# Patient Record
Sex: Female | Born: 1945 | Race: White | Hispanic: No | Marital: Married | Smoking: Former smoker
Health system: Southern US, Community
[De-identification: ages and names within clinical notes are randomized; demographics above are authoritative.]

## PROBLEM LIST (undated history)

## (undated) DIAGNOSIS — E871 Hypo-osmolality and hyponatremia: Secondary | ICD-10-CM

## (undated) DIAGNOSIS — N183 Chronic kidney disease, stage 3 unspecified: Secondary | ICD-10-CM

## (undated) DIAGNOSIS — D696 Thrombocytopenia, unspecified: Secondary | ICD-10-CM

## (undated) DIAGNOSIS — I10 Essential (primary) hypertension: Secondary | ICD-10-CM

## (undated) DIAGNOSIS — N179 Acute kidney failure, unspecified: Secondary | ICD-10-CM

## (undated) DIAGNOSIS — C801 Malignant (primary) neoplasm, unspecified: Secondary | ICD-10-CM

## (undated) DIAGNOSIS — I639 Cerebral infarction, unspecified: Secondary | ICD-10-CM

## (undated) DIAGNOSIS — C3490 Malignant neoplasm of unspecified part of unspecified bronchus or lung: Secondary | ICD-10-CM

## (undated) DIAGNOSIS — H547 Unspecified visual loss: Secondary | ICD-10-CM

## (undated) DIAGNOSIS — D649 Anemia, unspecified: Secondary | ICD-10-CM

## (undated) DIAGNOSIS — I4891 Unspecified atrial fibrillation: Secondary | ICD-10-CM

## (undated) DIAGNOSIS — E785 Hyperlipidemia, unspecified: Secondary | ICD-10-CM

## (undated) HISTORY — DX: Essential (primary) hypertension: I10

## (undated) HISTORY — DX: Hyperlipidemia, unspecified: E78.5

## (undated) HISTORY — PX: TUBAL LIGATION: SHX77

---

## 2014-03-18 ENCOUNTER — Ambulatory Visit (INDEPENDENT_AMBULATORY_CARE_PROVIDER_SITE_OTHER): Payer: 59

## 2014-03-18 VITALS — BP 122/79 | HR 84 | Resp 12

## 2014-03-18 DIAGNOSIS — R52 Pain, unspecified: Secondary | ICD-10-CM

## 2014-03-18 DIAGNOSIS — M722 Plantar fascial fibromatosis: Secondary | ICD-10-CM

## 2014-03-18 DIAGNOSIS — M7732 Calcaneal spur, left foot: Secondary | ICD-10-CM

## 2014-03-18 MED ORDER — MELOXICAM 15 MG PO TABS: 15.0000 mg | ORAL_TABLET | Freq: Every day | ORAL | Status: DC

## 2014-03-18 NOTE — Progress Notes (Signed)
   Subjective:    Patient ID: Michele [name], female    DOB: 1945-09-30, 68 y.o.   MRN: [id number]  HPI  PT STATED LT FOOT BOTTOM OF THE HEEL IS BEEN SORE FOR 4 MONTHS. THE HEEL IS NOT GETTING ANY BETTER OR WORSE. THE FOOT GET AGGRAVATED BY PRESSURE. TRIED WEARING GEL INSERTS BUT NO HELP.  Review of Systems  HENT: Positive for hearing loss.        RINGING EARS  All other systems reviewed and are negative.      Objective:   Physical Exam 68 year old white female well-developed well-nourished oriented 3 presents this time with a several month history of inferior left heel and arch pain both feet have some nodular areas in the medial band of plantar fascia which slight tenderness most significant pain in the inferior calcaneal tubercle area of the left heel. X-rays confirm well-developed inferior calcaneal spur generalized mild osteopenic changes mild fascial thickening noted with some nodularity or fibrosis noted in fashion bilateral. Neurovascular status otherwise intact pedal pulses are palpable epicritic and proprioceptive sensations intact and symmetric bilateral there is normal plantar response and DTRs. Dermatologic his skin color pigment normal hair growth absent nails are incurvated       Assessment & Plan:  Assessment plantar fasciitis/heel spur syndrome left at this time fascial strapping applied to the left foot prescription for meloxicam is given recommend ice to the affected area stable shoes no barefoot or flimsy shoes or flip-flops reappoint 2 weeks for follow-up may be candidate for orthoses based on progress next  Harriet Masson DPM

## 2014-03-18 NOTE — Patient Instructions (Signed)
Plantar Fasciitis Plantar fasciitis is a common condition that causes foot pain. It is soreness (inflammation) of the band of tough fibrous tissue on the bottom of the foot that runs from the heel bone (calcaneus) to the ball of the foot. The cause of this soreness may be from excessive standing, poor fitting shoes, running on hard surfaces, being overweight, having an abnormal walk, or overuse (this is common in runners) of the painful foot or feet. It is also common in aerobic exercise dancers and ballet dancers. SYMPTOMS  Most people with plantar fasciitis complain of:  Severe pain in the morning on the bottom of their foot especially when taking the first steps out of bed. This pain recedes after a few minutes of walking.  Severe pain is experienced also during walking following a long period of inactivity.  Pain is worse when walking barefoot or up stairs DIAGNOSIS   Your caregiver will diagnose this condition by examining and feeling your foot.  Special tests such as X-rays of your foot, are usually not needed. PREVENTION   Consult a sports medicine professional before beginning a new exercise program.  Walking programs offer a good workout. With walking there is a lower chance of overuse injuries common to runners. There is less impact and less jarring of the joints.  Begin all new exercise programs slowly. If problems or pain develop, decrease the amount of time or distance until you are at a comfortable level.  Wear good shoes and replace them regularly.  Stretch your foot and the heel cords at the back of the ankle (Achilles tendon) both before and after exercise.  Run or exercise on even surfaces that are not hard. For example, asphalt is better than pavement.  Do not run barefoot on hard surfaces.  If using a treadmill, vary the incline.  Do not continue to workout if you have foot or joint problems. Seek professional help if they do not improve. HOME CARE INSTRUCTIONS     Avoid activities that cause you pain until you recover.  Use ice or cold packs on the problem or painful areas after working out.  Only take over-the-counter or prescription medicines for pain, discomfort, or fever as directed by your caregiver.  Soft shoe inserts or athletic shoes with air or gel sole cushions may be helpful.  If problems continue or become more severe, consult a sports medicine caregiver or your own health care provider. Cortisone is a potent anti-inflammatory medication that may be injected into the painful area. You can discuss this treatment with your caregiver. MAKE SURE YOU:   Understand these instructions.  Will watch your condition.  Will get help right away if you are not doing well or get worse. Document Released: 01/29/2001 Document Revised: 07/29/2011 Document Reviewed: 03/30/2008 [facility] Patient Information 2015 [place], [place]. This information is not intended to replace advice given to you by your health care provider. Make sure you discuss any questions you have with your health care provider.    ICE INSTRUCTIONS  Apply ice or cold pack to the affected area at least 3 times a day for 10-15 minutes each time.  You should also use ice after prolonged activity or vigorous exercise.  Do not apply ice longer than 20 minutes at one time.  Always keep a cloth between your skin and the ice pack to prevent burns.  Being consistent and following these instructions will help control your symptoms.  We suggest you purchase a gel ice pack because they are  reusable and do bit leak.  Some of them are designed to wrap around the area.  Use the method that works best for you.  Here are some other suggestions for icing.   Use a frozen bag of peas or corn-inexpensive and molds well to your body, usually stays frozen for 10 to 20 minutes.  Wet a towel with cold water and squeeze out the excess until it's damp.  Place in a bag in the freezer for 20 minutes. Then remove  and use.

## 2014-04-01 ENCOUNTER — Ambulatory Visit (INDEPENDENT_AMBULATORY_CARE_PROVIDER_SITE_OTHER): Payer: 59

## 2014-04-01 VITALS — BP 131/72 | HR 64 | Resp 12

## 2014-04-01 DIAGNOSIS — M7732 Calcaneal spur, left foot: Secondary | ICD-10-CM

## 2014-04-01 DIAGNOSIS — M722 Plantar fascial fibromatosis: Secondary | ICD-10-CM

## 2014-04-01 DIAGNOSIS — R52 Pain, unspecified: Secondary | ICD-10-CM

## 2014-04-01 NOTE — Progress Notes (Signed)
   Subjective:    Patient ID: Michele [name], female    DOB: 21-Mar-1946, 68 y.o.   MRN: [id number]  HPI  ''LT FOOT START HAVING PAIN STARTED YESTERDAY BUT THE STRAP HELPS A LOT.''  Review of Systemsno new findings or systemic changes noted     Objective:   Physical Exam Neurovascular status is intact pedal pulses are palpable while fascial strapping was in place had a significant improvement based on that fact is a strong candidate for functional orthoses at this time orthotics are excluded from her coverage and alternative would be a prefab OTC orthotics such as power step. Our step orthotics I 6 never dispensed fitted to the patient's foot oral and written instructions for use of the orthotic and break in are given recheck in a month or 2 for adjustments if needed       Assessment & Plan:  Assessment plantar fasciitis/heel spur syndrome responsive to fascial taping should respond to OTC power step orthotics will reassess in a month or 2 if needed. Written instructions for orthotics are given recommend continue NSAID and ice as needed  Harriet Masson DPM

## 2014-04-01 NOTE — Patient Instructions (Signed)

## 2018-01-04 NOTE — Progress Notes (Signed)
Synopsis: Referred in August 2019 for LLL Lung mass w/ Adenopathy by Ronita Hipps, MD  Subjective:   PATIENT ID: Michele [name] GENDER: female DOB: 1945/09/13, MRN: [id number]  Chief Complaint  Patient presents with  . Pulmonary Consult    referred by Eugenio Hoes, NP for Lung Mass    Patient has a past medical history of former tobacco abuse, hypertension, hyperlipidemia.  Patient was recently seen in the emergency room for evaluation of chest pain and epigastric pain.  She had chest imaging which revealed the presence of the left lower lobe mass as well as enlarged mediastinal adenopathy and hilar adenopathy.  The patient was subsequently referred to [facility] cancer center and was seen by Ms. Theurkorn, NP who works with Dr. Lavera Guise.  This abnormality was found incidentally.  She has no additional complaints.  She is a former smoker who quit in 2010 however is currently vaping.  She does have occasional cough.  She denies hemoptysis.   Past Medical History:  Diagnosis Date  . Hyperlipidemia   . Hypertension      Family History  Problem Relation Age of Onset  . Dementia Mother   . Breast cancer Mother   . Heart attack Father      Social History   Socioeconomic History  . Marital status: Married    Spouse name: Not on file  . Number of children: Not on file  . Years of education: Not on file  . Highest education level: Not on file  Occupational History  . Not on file  Social Needs  . Financial resource strain: Not on file  . Food insecurity:    Worry: Not on file    Inability: Not on file  . Transportation needs:    Medical: Not on file    Non-medical: Not on file  Tobacco Use  . Smoking status: Former Smoker    Packs/day: 1.00    Years: 25.00    Pack years: 25.00    Types: Cigarettes    Last attempt to quit: 05/20/2008    Years since quitting: 9.6  . Smokeless tobacco: Never Used  Substance and Sexual Activity  . Alcohol use: No  . Drug use: No    . Sexual activity: Not on file  Lifestyle  . Physical activity:    Days per week: Not on file    Minutes per session: Not on file  . Stress: Not on file  Relationships  . Social connections:    Talks on phone: Not on file    Gets together: Not on file    Attends religious service: Not on file    Active member of club or organization: Not on file    Attends meetings of clubs or organizations: Not on file    Relationship status: Not on file  . Intimate partner violence:    Fear of current or ex partner: Not on file    Emotionally abused: Not on file    Physically abused: Not on file    Forced sexual activity: Not on file  Other Topics Concern  . Not on file  Social History Narrative  . Not on file     No Known Allergies   Outpatient Medications Prior to Visit  Medication Sig Dispense Refill  . atorvastatin (LIPITOR) 20 MG tablet Take 20 mg by mouth daily.  3  . losartan (COZAAR) 25 MG tablet Take 25 mg by mouth daily.  3  . meloxicam (MOBIC) 15 MG tablet  Take 1 tablet (15 mg total) by mouth daily. 30 tablet 1   No facility-administered medications prior to visit.     Review of Systems  Constitutional: Negative.   HENT: Negative.   Eyes: Negative.   Respiratory: Positive for cough, sputum production and shortness of breath.   Cardiovascular: Negative.   Gastrointestinal: Negative.   Musculoskeletal: Negative.   Skin: Negative.   Neurological: Negative.   Endo/Heme/Allergies: Negative.   Psychiatric/Behavioral: Negative.      Objective:  Physical Exam  Constitutional: She is oriented to person, place, and time. She appears well-developed and well-nourished. No distress.  HENT:  Head: Normocephalic and atraumatic.  Mouth/Throat: Oropharynx is clear and moist.  Eyes: Pupils are equal, round, and reactive to light. Conjunctivae are normal. No scleral icterus.  Neck: Neck supple. No JVD present. No tracheal deviation present.  Cardiovascular: Normal rate, regular  rhythm, normal heart sounds and intact distal pulses.  No murmur heard. Pulmonary/Chest: Effort normal and breath sounds normal. No accessory muscle usage or stridor. No tachypnea. No respiratory distress. She has no wheezes. She has no rhonchi. She has no rales.  Abdominal: Soft. Bowel sounds are normal. She exhibits no distension. There is no tenderness.  Musculoskeletal: She exhibits no edema or tenderness.  Lymphadenopathy:    She has no cervical adenopathy.  Neurological: She is alert and oriented to person, place, and time.  Skin: Skin is warm and dry. Capillary refill takes less than 2 seconds. No rash noted.  Psychiatric: She has a normal mood and affect. Her behavior is normal.  Vitals reviewed.   Vitals:   01/05/18 1318  BP: (!) 144/92  Pulse: 87  SpO2: 97%  Weight: 188 lb (85.3 kg)  Height: 5\' 4"  (1.626 m)   97% on RA BMI Readings from Last 3 Encounters:  01/05/18 32.27 kg/m   Wt Readings from Last 3 Encounters:  01/05/18 188 lb (85.3 kg)     CBC No results found for: WBC, RBC, HGB, HCT, PLT, MCV, MCH, MCHC, RDW, LYMPHSABS, MONOABS, EOSABS, BASOSABS  Chest Imaging: 12/30/2017 PET Imaging Report: [facility] IMPRESSION:  1. Hypermetabolic 4.8 cm left lower lobe mass along with a 1.3 cm  hypermetabolic lymph node in the AP lower left paratracheal region.  Appearance compatible with active malignancy. If this mass turns out  to be non-small cell carcinoma, then this would represent T2a N2 M0  disease (stage IIIA).  2. The 1.0 cm hypodense lesion in segment 5 of the liver is not  hypermetabolic, favoring a benign etiology.  3. Other imaging findings of potential clinical significance:  Chronic bilateral maxillary sinusitis. Aortic Atherosclerosis  (ICD10-I70.0). Coronary atherosclerosis. Sigmoid colon  diverticulosis. Degenerative arthropathy of both hips.   I have personally reviewed the PET images as well as the CT images including the PET  avid mediastinal adenopathy that was seen.  I discussed the results and reviewed the images in person with the patient and her husband at bedside.The patient's images have been independently reviewed by me.    Pulmonary Functions Testing Results: No results found for: FEV1, FVC, FEV1FVC, TLC, DLCO  FeNO: None  Pathology: None   Echocardiogram: None   Heart Catheterization: None    Assessment & Plan:   Mass of lower lobe of left lung - Plan: Ambulatory referral to Pulmonology  Mediastinal adenopathy  Hilar adenopathy  Abnormal PET of left lung  Abnormal PET scan of mediastinum  Discussion:  This is a pleasant 72 year old former smoker with a large left  lower lobe PET avid lesion and PET avid mediastinal and hilar adenopathy.  Imaging criteria would suggest a stage IIIa disease.  Her images are consistent and concerning for a a primary advanced stage bronchogenic malignancy.  We will plan for outpatient EBUS bronchoscopy with TBNA for diagnosis and staging of the patient's probable underlying lung cancer.  Return to clinic in 6 weeks.  With full PFTs.  We will see her at her scheduled bronchoscopy and determine additional follow-up needs at this time.  We will need to CC: Midge Minium, NP as well as Dr. Lavera Guise, [facility] at [facility].    Current Outpatient Medications:  .  atorvastatin (LIPITOR) 20 MG tablet, Take 20 mg by mouth daily., Disp: , Rfl: 3 .  losartan (COZAAR) 25 MG tablet, Take 25 mg by mouth daily., Disp: , Rfl: 3   Garner Nash, DO [facility] 01/05/2018 1:56 PM

## 2018-01-04 NOTE — H&P (View-Only) (Signed)
Synopsis: Referred in August 2019 for LLL Lung mass w/ Adenopathy by Ronita Hipps, MD  Subjective:   PATIENT ID: Michele [name] GENDER: female DOB: Mar 17, 1946, MRN: [id number]  Chief Complaint  Patient presents with  . Pulmonary Consult    referred by Eugenio Hoes, NP for Lung Mass    Patient has a past medical history of former tobacco abuse, hypertension, hyperlipidemia.  Patient was recently seen in the emergency room for evaluation of chest pain and epigastric pain.  She had chest imaging which revealed the presence of the left lower lobe mass as well as enlarged mediastinal adenopathy and hilar adenopathy.  The patient was subsequently referred to [facility] cancer center and was seen by Ms. Theurkorn, NP who works with Dr. Lavera Guise.  This abnormality was found incidentally.  She has no additional complaints.  She is a former smoker who quit in 2010 however is currently vaping.  She does have occasional cough.  She denies hemoptysis.   Past Medical History:  Diagnosis Date  . Hyperlipidemia   . Hypertension      Family History  Problem Relation Age of Onset  . Dementia Mother   . Breast cancer Mother   . Heart attack Father      Social History   Socioeconomic History  . Marital status: Married    Spouse name: Not on file  . Number of children: Not on file  . Years of education: Not on file  . Highest education level: Not on file  Occupational History  . Not on file  Social Needs  . Financial resource strain: Not on file  . Food insecurity:    Worry: Not on file    Inability: Not on file  . Transportation needs:    Medical: Not on file    Non-medical: Not on file  Tobacco Use  . Smoking status: Former Smoker    Packs/day: 1.00    Years: 25.00    Pack years: 25.00    Types: Cigarettes    Last attempt to quit: 05/20/2008    Years since quitting: 9.6  . Smokeless tobacco: Never Used  Substance and Sexual Activity  . Alcohol use: No  . Drug use: No    . Sexual activity: Not on file  Lifestyle  . Physical activity:    Days per week: Not on file    Minutes per session: Not on file  . Stress: Not on file  Relationships  . Social connections:    Talks on phone: Not on file    Gets together: Not on file    Attends religious service: Not on file    Active member of club or organization: Not on file    Attends meetings of clubs or organizations: Not on file    Relationship status: Not on file  . Intimate partner violence:    Fear of current or ex partner: Not on file    Emotionally abused: Not on file    Physically abused: Not on file    Forced sexual activity: Not on file  Other Topics Concern  . Not on file  Social History Narrative  . Not on file     No Known Allergies   Outpatient Medications Prior to Visit  Medication Sig Dispense Refill  . atorvastatin (LIPITOR) 20 MG tablet Take 20 mg by mouth daily.  3  . losartan (COZAAR) 25 MG tablet Take 25 mg by mouth daily.  3  . meloxicam (MOBIC) 15 MG tablet  Take 1 tablet (15 mg total) by mouth daily. 30 tablet 1   No facility-administered medications prior to visit.     Review of Systems  Constitutional: Negative.   HENT: Negative.   Eyes: Negative.   Respiratory: Positive for cough, sputum production and shortness of breath.   Cardiovascular: Negative.   Gastrointestinal: Negative.   Musculoskeletal: Negative.   Skin: Negative.   Neurological: Negative.   Endo/Heme/Allergies: Negative.   Psychiatric/Behavioral: Negative.      Objective:  Physical Exam  Constitutional: She is oriented to person, place, and time. She appears well-developed and well-nourished. No distress.  HENT:  Head: Normocephalic and atraumatic.  Mouth/Throat: Oropharynx is clear and moist.  Eyes: Pupils are equal, round, and reactive to light. Conjunctivae are normal. No scleral icterus.  Neck: Neck supple. No JVD present. No tracheal deviation present.  Cardiovascular: Normal rate, regular  rhythm, normal heart sounds and intact distal pulses.  No murmur heard. Pulmonary/Chest: Effort normal and breath sounds normal. No accessory muscle usage or stridor. No tachypnea. No respiratory distress. She has no wheezes. She has no rhonchi. She has no rales.  Abdominal: Soft. Bowel sounds are normal. She exhibits no distension. There is no tenderness.  Musculoskeletal: She exhibits no edema or tenderness.  Lymphadenopathy:    She has no cervical adenopathy.  Neurological: She is alert and oriented to person, place, and time.  Skin: Skin is warm and dry. Capillary refill takes less than 2 seconds. No rash noted.  Psychiatric: She has a normal mood and affect. Her behavior is normal.  Vitals reviewed.   Vitals:   01/05/18 1318  BP: (!) 144/92  Pulse: 87  SpO2: 97%  Weight: 188 lb (85.3 kg)  Height: 5\' 4"  (1.626 m)   97% on RA BMI Readings from Last 3 Encounters:  01/05/18 32.27 kg/m   Wt Readings from Last 3 Encounters:  01/05/18 188 lb (85.3 kg)     CBC No results found for: WBC, RBC, HGB, HCT, PLT, MCV, MCH, MCHC, RDW, LYMPHSABS, MONOABS, EOSABS, BASOSABS  Chest Imaging: 12/30/2017 PET Imaging Report: [facility] IMPRESSION:  1. Hypermetabolic 4.8 cm left lower lobe mass along with a 1.3 cm  hypermetabolic lymph node in the AP lower left paratracheal region.  Appearance compatible with active malignancy. If this mass turns out  to be non-small cell carcinoma, then this would represent T2a N2 M0  disease (stage IIIA).  2. The 1.0 cm hypodense lesion in segment 5 of the liver is not  hypermetabolic, favoring a benign etiology.  3. Other imaging findings of potential clinical significance:  Chronic bilateral maxillary sinusitis. Aortic Atherosclerosis  (ICD10-I70.0). Coronary atherosclerosis. Sigmoid colon  diverticulosis. Degenerative arthropathy of both hips.   I have personally reviewed the PET images as well as the CT images including the PET  avid mediastinal adenopathy that was seen.  I discussed the results and reviewed the images in person with the patient and her husband at bedside.The patient's images have been independently reviewed by me.    Pulmonary Functions Testing Results: No results found for: FEV1, FVC, FEV1FVC, TLC, DLCO  FeNO: None  Pathology: None   Echocardiogram: None   Heart Catheterization: None    Assessment & Plan:   Mass of lower lobe of left lung - Plan: Ambulatory referral to Pulmonology  Mediastinal adenopathy  Hilar adenopathy  Abnormal PET of left lung  Abnormal PET scan of mediastinum  Discussion:  This is a pleasant 71 year old former smoker with a large left  lower lobe PET avid lesion and PET avid mediastinal and hilar adenopathy.  Imaging criteria would suggest a stage IIIa disease.  Her images are consistent and concerning for a a primary advanced stage bronchogenic malignancy.  We will plan for outpatient EBUS bronchoscopy with TBNA for diagnosis and staging of the patient's probable underlying lung cancer.  Return to clinic in 6 weeks.  With full PFTs.  We will see her at her scheduled bronchoscopy and determine additional follow-up needs at this time.  We will need to CC: Midge Minium, NP as well as Dr. Lavera Guise, MD at [facility].    Current Outpatient Medications:  .  atorvastatin (LIPITOR) 20 MG tablet, Take 20 mg by mouth daily., Disp: , Rfl: 3 .  losartan (COZAAR) 25 MG tablet, Take 25 mg by mouth daily., Disp: , Rfl: 3   Garner Nash, DO [facility] 01/05/2018 1:56 PM

## 2018-01-05 ENCOUNTER — Encounter (INDEPENDENT_AMBULATORY_CARE_PROVIDER_SITE_OTHER): Payer: Self-pay

## 2018-01-05 ENCOUNTER — Ambulatory Visit: Payer: Medicare Other | Admitting: Pulmonary Disease

## 2018-01-05 ENCOUNTER — Encounter: Payer: Self-pay | Admitting: Pulmonary Disease

## 2018-01-05 VITALS — BP 144/92 | HR 87 | Ht 64.0 in | Wt 188.0 lb

## 2018-01-05 DIAGNOSIS — R942 Abnormal results of pulmonary function studies: Secondary | ICD-10-CM | POA: Insufficient documentation

## 2018-01-05 DIAGNOSIS — R590 Localized enlarged lymph nodes: Secondary | ICD-10-CM

## 2018-01-05 DIAGNOSIS — R948 Abnormal results of function studies of other organs and systems: Secondary | ICD-10-CM

## 2018-01-05 DIAGNOSIS — R918 Other nonspecific abnormal finding of lung field: Secondary | ICD-10-CM | POA: Insufficient documentation

## 2018-01-05 NOTE — Patient Instructions (Signed)
We will schedule you for an outpatient bronchoscopy with biopsy of the lymph nodes within your chest, this is planned for this Thursday afternoon.   You must not eat anything past midnight the night before.

## 2018-01-08 ENCOUNTER — Ambulatory Visit (HOSPITAL_COMMUNITY): Payer: Medicare Other | Admitting: Registered Nurse

## 2018-01-08 ENCOUNTER — Ambulatory Visit (HOSPITAL_COMMUNITY)
Admission: RE | Admit: 2018-01-08 | Discharge: 2018-01-08 | Disposition: A | Discharge status: Home / Self Care | Payer: Medicare Other | Source: Ambulatory Visit | Attending: Pulmonary Disease | Admitting: Pulmonary Disease

## 2018-01-08 ENCOUNTER — Encounter (HOSPITAL_COMMUNITY): Payer: Self-pay | Admitting: *Deleted

## 2018-01-08 ENCOUNTER — Other Ambulatory Visit: Payer: Self-pay

## 2018-01-08 ENCOUNTER — Ambulatory Visit (HOSPITAL_BASED_OUTPATIENT_CLINIC_OR_DEPARTMENT_OTHER)
Admission: RE | Admit: 2018-01-08 | Discharge: 2018-01-08 | Disposition: A | Discharge status: Home / Self Care | Payer: Medicare Other | Source: Ambulatory Visit | Attending: Pulmonary Disease | Admitting: Pulmonary Disease

## 2018-01-08 ENCOUNTER — Encounter (HOSPITAL_COMMUNITY)
Admission: RE | Disposition: A | Discharge status: Home / Self Care | Payer: Self-pay | Source: Ambulatory Visit | Attending: Pulmonary Disease

## 2018-01-08 DIAGNOSIS — R918 Other nonspecific abnormal finding of lung field: Secondary | ICD-10-CM | POA: Diagnosis present

## 2018-01-08 DIAGNOSIS — C7802 Secondary malignant neoplasm of left lung: Secondary | ICD-10-CM | POA: Insufficient documentation

## 2018-01-08 DIAGNOSIS — J320 Chronic maxillary sinusitis: Secondary | ICD-10-CM | POA: Diagnosis not present

## 2018-01-08 DIAGNOSIS — E785 Hyperlipidemia, unspecified: Secondary | ICD-10-CM | POA: Diagnosis not present

## 2018-01-08 DIAGNOSIS — I10 Essential (primary) hypertension: Secondary | ICD-10-CM | POA: Diagnosis not present

## 2018-01-08 DIAGNOSIS — R948 Abnormal results of function studies of other organs and systems: Secondary | ICD-10-CM

## 2018-01-08 DIAGNOSIS — R942 Abnormal results of pulmonary function studies: Secondary | ICD-10-CM

## 2018-01-08 DIAGNOSIS — C801 Malignant (primary) neoplasm, unspecified: Secondary | ICD-10-CM | POA: Insufficient documentation

## 2018-01-08 DIAGNOSIS — Z87891 Personal history of nicotine dependence: Secondary | ICD-10-CM | POA: Diagnosis not present

## 2018-01-08 DIAGNOSIS — R590 Localized enlarged lymph nodes: Secondary | ICD-10-CM | POA: Insufficient documentation

## 2018-01-08 HISTORY — PX: FINE NEEDLE ASPIRATION: SHX5430

## 2018-01-08 HISTORY — PX: FLEXIBLE BRONCHOSCOPY: SHX5094

## 2018-01-08 HISTORY — PX: ENDOBRONCHIAL ULTRASOUND: SHX5096

## 2018-01-08 SURGERY — ENDOBRONCHIAL ULTRASOUND (EBUS)
Anesthesia: General | Laterality: Bilateral

## 2018-01-08 MED ORDER — FENTANYL CITRATE (PF) 250 MCG/5ML IJ SOLN
INTRAMUSCULAR | Status: DC | PRN
  Administered 2018-01-08: 100 ug via INTRAVENOUS
  Administered 2018-01-08 (×2): 50 ug via INTRAVENOUS

## 2018-01-08 MED ORDER — FENTANYL CITRATE (PF) 100 MCG/2ML IJ SOLN
INTRAMUSCULAR | Status: AC
  Filled 2018-01-08: qty 2

## 2018-01-08 MED ORDER — ONDANSETRON HCL 4 MG/2ML IJ SOLN
INTRAMUSCULAR | Status: DC | PRN
  Administered 2018-01-08: 4 mg via INTRAVENOUS

## 2018-01-08 MED ORDER — SUGAMMADEX SODIUM 200 MG/2ML IV SOLN
INTRAVENOUS | Status: DC | PRN
  Administered 2018-01-08: 200 mg via INTRAVENOUS

## 2018-01-08 MED ORDER — LIDOCAINE 2% (20 MG/ML) 5 ML SYRINGE
INTRAMUSCULAR | Status: DC | PRN
  Administered 2018-01-08: 100 mg via INTRAVENOUS

## 2018-01-08 MED ORDER — LACTATED RINGERS IV SOLN
INTRAVENOUS | Status: DC
  Administered 2018-01-08: 1000 mL via INTRAVENOUS

## 2018-01-08 MED ORDER — ROCURONIUM BROMIDE 10 MG/ML (PF) SYRINGE
PREFILLED_SYRINGE | INTRAVENOUS | Status: DC | PRN
  Administered 2018-01-08: 50 mg via INTRAVENOUS

## 2018-01-08 MED ORDER — PROPOFOL 10 MG/ML IV BOLUS
INTRAVENOUS | Status: DC | PRN
  Administered 2018-01-08: 60 mg via INTRAVENOUS
  Administered 2018-01-08: 140 mg via INTRAVENOUS

## 2018-01-08 NOTE — Anesthesia Procedure Notes (Signed)
Procedure Name: Intubation Date/Time: 01/08/2018 1:43 PM Performed by: Talbot Grumbling, CRNA Pre-anesthesia Checklist: Patient identified, Emergency Drugs available, Suction available and Patient being monitored Patient Re-evaluated:Patient Re-evaluated prior to induction Oxygen Delivery Method: Circle system utilized Preoxygenation: Pre-oxygenation with 100% oxygen Induction Type: IV induction Ventilation: Mask ventilation without difficulty Laryngoscope Size: Mac and 3 Grade View: Grade I Tube type: Oral Tube size: 9.0 mm Number of attempts: 1 Airway Equipment and Method: Stylet Placement Confirmation: ETT inserted through vocal cords under direct vision,  positive ETCO2 and breath sounds checked- equal and bilateral Secured at: 22 cm Tube secured with: Tape Dental Injury: Teeth and Oropharynx as per pre-operative assessment

## 2018-01-08 NOTE — Anesthesia Preprocedure Evaluation (Signed)
Anesthesia Evaluation  Patient identified by MRN, date of birth, ID band Patient awake    Reviewed: Allergy & Precautions, NPO status , Patient's Chart, lab work & pertinent test results  Airway Mallampati: I  TM Distance: >3 FB Neck ROM: Full    Dental   Pulmonary former smoker,    Pulmonary exam normal        Cardiovascular hypertension, Pt. on medications Normal cardiovascular exam     Neuro/Psych    GI/Hepatic   Endo/Other    Renal/GU      Musculoskeletal   Abdominal   Peds  Hematology   Anesthesia Other Findings   Reproductive/Obstetrics                             Anesthesia Physical Anesthesia Plan  ASA: III  Anesthesia Plan: General   Post-op Pain Management:    Induction: Intravenous  PONV Risk Score and Plan: 3 and Midazolam  Airway Management Planned: Oral ETT  Additional Equipment:   Intra-op Plan:   Post-operative Plan: Extubation in OR  Informed Consent: I have reviewed the patients History and Physical, chart, labs and discussed the procedure including the risks, benefits and alternatives for the proposed anesthesia with the patient or authorized representative who has indicated his/her understanding and acceptance.     Plan Discussed with: CRNA and Surgeon  Anesthesia Plan Comments:         Anesthesia Quick Evaluation

## 2018-01-08 NOTE — Transfer of Care (Signed)
Immediate Anesthesia Transfer of Care Note  Patient: Michele [name]  Procedure(s) Performed: ENDOBRONCHIAL ULTRASOUND (Bilateral ) FINE NEEDLE ASPIRATION (FNA) LINEAR  Patient Location: PACU  Anesthesia Type:General  Level of Consciousness: sedated  Airway & Oxygen Therapy: Patient Spontanous Breathing and Patient connected to face mask oxygen  Post-op Assessment: Report given to RN and Post -op Vital signs reviewed and stable  Post vital signs: Reviewed and stable  Last Vitals:  Vitals Value Taken Time  BP    Temp    Pulse 87 01/08/2018  2:33 PM  Resp 9 01/08/2018  2:33 PM  SpO2 100 % 01/08/2018  2:33 PM  Vitals shown include unvalidated device data.  Last Pain:  Vitals:   01/08/18 1211  TempSrc: Oral  PainSc: 0-No pain         Complications: No apparent anesthesia complications

## 2018-01-08 NOTE — Op Note (Signed)
Video Bronchoscopy with Endobronchial Ultrasound Procedure Note  Date of Operation: 01/08/2018  Pre-op Diagnosis: LLL Lung Mass  Post-op Diagnosis: LLL Lung Mass  Surgeon: Garner Nash, DO  Assistants: None   Anesthesia: General endotracheal anesthesia  Operation: Flexible video fiberoptic bronchoscopy with endobronchial ultrasound and biopsies.  Estimated Blood Loss: Minimal, <4HQ   Complications: None   Indications and History: Michele [name] is a 72 y.o. female with LLL lung mass and mediastinal adenopathy.  The risks, benefits, complications, treatment options and expected outcomes were discussed with the patient.  The possibilities of pneumothorax, pneumonia, reaction to medication, pulmonary aspiration, perforation of a viscus, bleeding, failure to diagnose a condition and creating a complication requiring transfusion or operation were discussed with the patient who freely signed the consent.    Description of Procedure: The patient was examined in the preoperative area and history and data from the preprocedure consultation were reviewed. It was deemed appropriate to proceed.  The patient was taken to ENDO RM1, identified as Michele [name] and the procedure verified as Flexible Video Fiberoptic Bronchoscopy with EBUS guided TBNA.  A Time Out was held and the above information confirmed. After being taken to the operating room general anesthesia was initiated and the patient  was orally intubated per anesthesia record. The video fiberoptic bronchoscope was introduced via the endotracheal tube and a general inspection was performed which showed diffuse bronchial pitting and bronchiectatic openings of the segmental airway. The standard scope was then withdrawn and the endobronchial ultrasound was used to identify and characterize the peritracheal, hilar and bronchial lymph nodes. Inspection showed right sided stations within normal limits, however an enlarged station 7 lymphnode measuring  2.5 cm in largest cross-section. Using real-time ultrasound guidance TBNA biopsies were take from Station 7 (subcarinal) node and were sent for cytology. The patient tolerated the procedure well without apparent complications. There was no significant blood loss. The bronchoscope was brought to just above the main carina and there was no active bleeding. The bronchoscope was withdrawn. Anesthesia was reversed and the patient was taken to the PACU for recovery.   Samples: 1. TBNA X6 to station 7 (subcarinal) was completed, 3 for slide and 3 for cell block  Plans:  The patient will be discharged from the PACU to home when recovered from anesthesia. We will review the cytology, pathology and microbiology results with the patient when they become available. Outpatient followup will be with me, 4-6 weeks, and a referral placed to medical oncology, Dr. Warnell Forester.   Garner Nash, DO  Pulmonary Critical Care 01/08/2018 2:26 PM  Personal pager: [phone] If unanswered, please page CCM On-call: [phone]

## 2018-01-08 NOTE — Anesthesia Postprocedure Evaluation (Signed)
Anesthesia Post Note  Patient: Michele [name]  Procedure(s) Performed: ENDOBRONCHIAL ULTRASOUND (Bilateral ) FINE NEEDLE ASPIRATION (FNA) LINEAR     Patient location during evaluation: PACU Anesthesia Type: General Level of consciousness: awake and alert Pain management: pain level controlled Vital Signs Assessment: post-procedure vital signs reviewed and stable Respiratory status: spontaneous breathing, nonlabored ventilation, respiratory function stable and patient connected to nasal cannula oxygen Cardiovascular status: blood pressure returned to baseline and stable Postop Assessment: no apparent nausea or vomiting Anesthetic complications: no    Last Vitals:  Vitals:   01/08/18 1211 01/08/18 1439  BP: (!) 203/89 (!) 167/71  Pulse: 90 87  Resp: 16 11  Temp: 36.7 C 36.6 C  SpO2: 99% 100%    Last Pain:  Vitals:   01/08/18 1439  TempSrc: Oral  PainSc: 0-No pain                 Mitsuye Schrodt DAVID

## 2018-01-08 NOTE — Interval H&P Note (Signed)
History and Physical Interval Note:  01/08/2018 1:33 PM  Michele [name]  has presented today for surgery, with the diagnosis of Lung mass  The various methods of treatment have been discussed with the patient and family. After consideration of risks, benefits and other options for treatment, the patient has consented to  Procedure(s): ENDOBRONCHIAL ULTRASOUND (Bilateral) as a surgical intervention .  The patient's history has been reviewed, patient examined, no change in status, stable for surgery.  I have reviewed the patient's chart and labs.  Questions were answered to the patient's satisfaction.     [place]

## 2018-01-09 ENCOUNTER — Encounter (HOSPITAL_COMMUNITY): Payer: Self-pay | Admitting: Pulmonary Disease

## 2018-01-09 ENCOUNTER — Telehealth: Payer: Self-pay | Admitting: Pulmonary Disease

## 2018-01-09 NOTE — Telephone Encounter (Signed)
OV note and bronch report have been faxed as requested.  Nothing further needed.

## 2018-01-14 NOTE — Progress Notes (Signed)
Michele [name], I called and spoke with the patient regarding her final pathology results. Can you please ensure this is sent to Dr. Lavera Guise office by tomorrow morning. She has an appointment with him tomorrow morning at 10am and he will need this pathology report.  Thanks Leory Plowman

## 2018-01-15 ENCOUNTER — Telehealth: Payer: Self-pay | Admitting: Pulmonary Disease

## 2018-01-15 DIAGNOSIS — Z800 Family history of malignant neoplasm of digestive organs: Secondary | ICD-10-CM | POA: Diagnosis not present

## 2018-01-15 DIAGNOSIS — Z87891 Personal history of nicotine dependence: Secondary | ICD-10-CM | POA: Diagnosis not present

## 2018-01-15 DIAGNOSIS — C3432 Malignant neoplasm of lower lobe, left bronchus or lung: Secondary | ICD-10-CM

## 2018-01-15 NOTE — Telephone Encounter (Signed)
This report has been faxed. I have left message with Pamala Hurry making her aware of this. Nothing further was needed.

## 2018-01-23 ENCOUNTER — Telehealth: Payer: Self-pay | Admitting: Pulmonary Disease

## 2018-01-23 NOTE — Telephone Encounter (Signed)
Pt requesting a copy of her pathology report for insurance purposes.  This has been printed and mailed to address on file.  Nothing further needed.

## 2018-02-11 ENCOUNTER — Ambulatory Visit: Payer: Medicare Other | Admitting: Pulmonary Disease

## 2018-02-11 DIAGNOSIS — Z87891 Personal history of nicotine dependence: Secondary | ICD-10-CM

## 2018-02-11 DIAGNOSIS — Z800 Family history of malignant neoplasm of digestive organs: Secondary | ICD-10-CM

## 2018-02-11 DIAGNOSIS — C3432 Malignant neoplasm of lower lobe, left bronchus or lung: Secondary | ICD-10-CM | POA: Diagnosis not present

## 2018-02-25 ENCOUNTER — Ambulatory Visit: Payer: Medicare Other | Admitting: Pulmonary Disease

## 2018-03-04 DIAGNOSIS — Z87891 Personal history of nicotine dependence: Secondary | ICD-10-CM

## 2018-03-04 DIAGNOSIS — C3432 Malignant neoplasm of lower lobe, left bronchus or lung: Secondary | ICD-10-CM

## 2018-03-04 DIAGNOSIS — Z800 Family history of malignant neoplasm of digestive organs: Secondary | ICD-10-CM

## 2018-03-04 DIAGNOSIS — D6481 Anemia due to antineoplastic chemotherapy: Secondary | ICD-10-CM

## 2018-03-06 DIAGNOSIS — Z0001 Encounter for general adult medical examination with abnormal findings: Secondary | ICD-10-CM

## 2018-03-11 ENCOUNTER — Ambulatory Visit: Payer: Medicare Other | Admitting: Pulmonary Disease

## 2018-03-25 DIAGNOSIS — C3432 Malignant neoplasm of lower lobe, left bronchus or lung: Secondary | ICD-10-CM | POA: Diagnosis not present

## 2018-03-25 DIAGNOSIS — D6481 Anemia due to antineoplastic chemotherapy: Secondary | ICD-10-CM | POA: Diagnosis not present

## 2018-03-25 DIAGNOSIS — D701 Agranulocytosis secondary to cancer chemotherapy: Secondary | ICD-10-CM

## 2018-04-12 DIAGNOSIS — I160 Hypertensive urgency: Secondary | ICD-10-CM

## 2018-04-12 DIAGNOSIS — R509 Fever, unspecified: Secondary | ICD-10-CM

## 2018-04-12 DIAGNOSIS — D709 Neutropenia, unspecified: Secondary | ICD-10-CM | POA: Diagnosis not present

## 2018-04-12 DIAGNOSIS — D701 Agranulocytosis secondary to cancer chemotherapy: Secondary | ICD-10-CM | POA: Diagnosis not present

## 2018-04-12 DIAGNOSIS — D696 Thrombocytopenia, unspecified: Secondary | ICD-10-CM

## 2018-04-12 DIAGNOSIS — C3432 Malignant neoplasm of lower lobe, left bronchus or lung: Secondary | ICD-10-CM

## 2018-04-12 DIAGNOSIS — N183 Chronic kidney disease, stage 3 (moderate): Secondary | ICD-10-CM

## 2018-04-13 DIAGNOSIS — I160 Hypertensive urgency: Secondary | ICD-10-CM | POA: Diagnosis not present

## 2018-04-13 DIAGNOSIS — N183 Chronic kidney disease, stage 3 (moderate): Secondary | ICD-10-CM | POA: Diagnosis not present

## 2018-04-13 DIAGNOSIS — D709 Neutropenia, unspecified: Secondary | ICD-10-CM | POA: Diagnosis not present

## 2018-04-13 DIAGNOSIS — D696 Thrombocytopenia, unspecified: Secondary | ICD-10-CM | POA: Diagnosis not present

## 2018-04-14 DIAGNOSIS — N183 Chronic kidney disease, stage 3 (moderate): Secondary | ICD-10-CM | POA: Diagnosis not present

## 2018-04-14 DIAGNOSIS — I160 Hypertensive urgency: Secondary | ICD-10-CM | POA: Diagnosis not present

## 2018-04-14 DIAGNOSIS — D709 Neutropenia, unspecified: Secondary | ICD-10-CM | POA: Diagnosis not present

## 2018-04-14 DIAGNOSIS — D696 Thrombocytopenia, unspecified: Secondary | ICD-10-CM | POA: Diagnosis not present

## 2018-04-15 DIAGNOSIS — I160 Hypertensive urgency: Secondary | ICD-10-CM | POA: Diagnosis not present

## 2018-04-15 DIAGNOSIS — I480 Paroxysmal atrial fibrillation: Secondary | ICD-10-CM

## 2018-04-15 DIAGNOSIS — D709 Neutropenia, unspecified: Secondary | ICD-10-CM | POA: Diagnosis not present

## 2018-04-15 DIAGNOSIS — D696 Thrombocytopenia, unspecified: Secondary | ICD-10-CM | POA: Diagnosis not present

## 2018-04-15 DIAGNOSIS — N183 Chronic kidney disease, stage 3 (moderate): Secondary | ICD-10-CM | POA: Diagnosis not present

## 2018-05-07 DIAGNOSIS — C3432 Malignant neoplasm of lower lobe, left bronchus or lung: Secondary | ICD-10-CM

## 2018-05-07 DIAGNOSIS — Z9221 Personal history of antineoplastic chemotherapy: Secondary | ICD-10-CM

## 2018-05-07 DIAGNOSIS — Z923 Personal history of irradiation: Secondary | ICD-10-CM | POA: Diagnosis not present

## 2018-05-29 ENCOUNTER — Telehealth: Payer: Self-pay

## 2018-05-29 NOTE — Telephone Encounter (Signed)
LMTCB. Please schedule patient in a 30 minute office visit with BI first available. Thanks.

## 2018-06-01 DIAGNOSIS — D61818 Other pancytopenia: Secondary | ICD-10-CM

## 2018-06-01 DIAGNOSIS — D72829 Elevated white blood cell count, unspecified: Secondary | ICD-10-CM

## 2018-06-01 DIAGNOSIS — Z9221 Personal history of antineoplastic chemotherapy: Secondary | ICD-10-CM

## 2018-06-01 DIAGNOSIS — C3432 Malignant neoplasm of lower lobe, left bronchus or lung: Secondary | ICD-10-CM

## 2018-06-01 DIAGNOSIS — Z923 Personal history of irradiation: Secondary | ICD-10-CM | POA: Diagnosis not present

## 2018-06-01 DIAGNOSIS — R509 Fever, unspecified: Secondary | ICD-10-CM

## 2018-06-01 DIAGNOSIS — N179 Acute kidney failure, unspecified: Secondary | ICD-10-CM

## 2018-06-01 DIAGNOSIS — D649 Anemia, unspecified: Secondary | ICD-10-CM

## 2018-06-02 DIAGNOSIS — R509 Fever, unspecified: Secondary | ICD-10-CM | POA: Diagnosis not present

## 2018-06-02 DIAGNOSIS — D61818 Other pancytopenia: Secondary | ICD-10-CM | POA: Diagnosis not present

## 2018-06-02 DIAGNOSIS — D649 Anemia, unspecified: Secondary | ICD-10-CM | POA: Diagnosis not present

## 2018-06-02 DIAGNOSIS — N179 Acute kidney failure, unspecified: Secondary | ICD-10-CM | POA: Diagnosis not present

## 2018-06-03 DIAGNOSIS — R509 Fever, unspecified: Secondary | ICD-10-CM | POA: Diagnosis not present

## 2018-06-03 DIAGNOSIS — D649 Anemia, unspecified: Secondary | ICD-10-CM | POA: Diagnosis not present

## 2018-06-03 DIAGNOSIS — D61818 Other pancytopenia: Secondary | ICD-10-CM | POA: Diagnosis not present

## 2018-06-03 DIAGNOSIS — N179 Acute kidney failure, unspecified: Secondary | ICD-10-CM | POA: Diagnosis not present

## 2018-06-04 DIAGNOSIS — N179 Acute kidney failure, unspecified: Secondary | ICD-10-CM | POA: Diagnosis not present

## 2018-06-04 DIAGNOSIS — D61818 Other pancytopenia: Secondary | ICD-10-CM | POA: Diagnosis not present

## 2018-06-04 DIAGNOSIS — D649 Anemia, unspecified: Secondary | ICD-10-CM | POA: Diagnosis not present

## 2018-06-04 DIAGNOSIS — R509 Fever, unspecified: Secondary | ICD-10-CM | POA: Diagnosis not present

## 2018-06-05 DIAGNOSIS — D649 Anemia, unspecified: Secondary | ICD-10-CM | POA: Diagnosis not present

## 2018-06-05 DIAGNOSIS — R509 Fever, unspecified: Secondary | ICD-10-CM | POA: Diagnosis not present

## 2018-06-05 DIAGNOSIS — D61818 Other pancytopenia: Secondary | ICD-10-CM | POA: Diagnosis not present

## 2018-06-05 DIAGNOSIS — N179 Acute kidney failure, unspecified: Secondary | ICD-10-CM | POA: Diagnosis not present

## 2018-06-06 DIAGNOSIS — R509 Fever, unspecified: Secondary | ICD-10-CM | POA: Diagnosis not present

## 2018-06-06 DIAGNOSIS — D61818 Other pancytopenia: Secondary | ICD-10-CM | POA: Diagnosis not present

## 2018-06-06 DIAGNOSIS — D649 Anemia, unspecified: Secondary | ICD-10-CM | POA: Diagnosis not present

## 2018-06-06 DIAGNOSIS — N179 Acute kidney failure, unspecified: Secondary | ICD-10-CM | POA: Diagnosis not present

## 2018-06-07 DIAGNOSIS — N179 Acute kidney failure, unspecified: Secondary | ICD-10-CM | POA: Diagnosis not present

## 2018-06-07 DIAGNOSIS — D649 Anemia, unspecified: Secondary | ICD-10-CM | POA: Diagnosis not present

## 2018-06-07 DIAGNOSIS — R509 Fever, unspecified: Secondary | ICD-10-CM | POA: Diagnosis not present

## 2018-06-07 DIAGNOSIS — D61818 Other pancytopenia: Secondary | ICD-10-CM | POA: Diagnosis not present

## 2018-06-08 DIAGNOSIS — D649 Anemia, unspecified: Secondary | ICD-10-CM | POA: Diagnosis not present

## 2018-06-08 DIAGNOSIS — D61818 Other pancytopenia: Secondary | ICD-10-CM | POA: Diagnosis not present

## 2018-06-08 DIAGNOSIS — N179 Acute kidney failure, unspecified: Secondary | ICD-10-CM | POA: Diagnosis not present

## 2018-06-08 DIAGNOSIS — R509 Fever, unspecified: Secondary | ICD-10-CM | POA: Diagnosis not present

## 2018-06-08 DIAGNOSIS — J9601 Acute respiratory failure with hypoxia: Secondary | ICD-10-CM

## 2018-06-09 ENCOUNTER — Telehealth: Payer: Self-pay | Admitting: Pulmonary Disease

## 2018-06-09 DIAGNOSIS — R509 Fever, unspecified: Secondary | ICD-10-CM | POA: Diagnosis not present

## 2018-06-09 DIAGNOSIS — C3432 Malignant neoplasm of lower lobe, left bronchus or lung: Secondary | ICD-10-CM

## 2018-06-09 DIAGNOSIS — J189 Pneumonia, unspecified organism: Secondary | ICD-10-CM | POA: Diagnosis not present

## 2018-06-09 DIAGNOSIS — N179 Acute kidney failure, unspecified: Secondary | ICD-10-CM | POA: Diagnosis not present

## 2018-06-09 DIAGNOSIS — D61818 Other pancytopenia: Secondary | ICD-10-CM

## 2018-06-09 DIAGNOSIS — D649 Anemia, unspecified: Secondary | ICD-10-CM | POA: Diagnosis not present

## 2018-06-09 DIAGNOSIS — J9601 Acute respiratory failure with hypoxia: Secondary | ICD-10-CM | POA: Diagnosis not present

## 2018-06-09 NOTE — Telephone Encounter (Signed)
PCCM:  I called and spoke with the patients husband. He states that she has been in the hospital for the past 9 days. I was able to review the CT imaging from [place]. He would like for her to have follow up with me.   We will arrange this for Feb 4th.   [place] 06/09/2018 4:34 PM

## 2018-06-09 NOTE — Telephone Encounter (Signed)
Spoke with pt's spouse DL  Pt admitted at [facility] with PNA  She had chest ct 06/08/2018 and he would like Dr Valeta Harms to review  I printed report from PACS and gave to Dr Valeta Harms to review  Spouse also mentioned potential bronch to be done by Dr Alcide Clever tomorrow

## 2018-06-09 NOTE — Telephone Encounter (Signed)
Pt is calling back cb# [phone]

## 2018-06-09 NOTE — Telephone Encounter (Signed)
Looks like Brit already scheduled ov for 06/23/18  I will forward her so she is aware Icard wants to d/c  Can not call for it yet as she is still admitted  Thanks

## 2018-06-09 NOTE — Telephone Encounter (Signed)
MD aware release of information will need to be signed. Will address at next office visit. Nothing further is needed at this time.

## 2018-06-10 DIAGNOSIS — N179 Acute kidney failure, unspecified: Secondary | ICD-10-CM | POA: Diagnosis not present

## 2018-06-10 DIAGNOSIS — D61818 Other pancytopenia: Secondary | ICD-10-CM | POA: Diagnosis not present

## 2018-06-10 DIAGNOSIS — R509 Fever, unspecified: Secondary | ICD-10-CM | POA: Diagnosis not present

## 2018-06-10 DIAGNOSIS — D649 Anemia, unspecified: Secondary | ICD-10-CM | POA: Diagnosis not present

## 2018-06-11 DIAGNOSIS — D61818 Other pancytopenia: Secondary | ICD-10-CM | POA: Diagnosis not present

## 2018-06-11 DIAGNOSIS — D649 Anemia, unspecified: Secondary | ICD-10-CM | POA: Diagnosis not present

## 2018-06-11 DIAGNOSIS — R509 Fever, unspecified: Secondary | ICD-10-CM | POA: Diagnosis not present

## 2018-06-11 DIAGNOSIS — N179 Acute kidney failure, unspecified: Secondary | ICD-10-CM | POA: Diagnosis not present

## 2018-06-18 DIAGNOSIS — C3432 Malignant neoplasm of lower lobe, left bronchus or lung: Secondary | ICD-10-CM | POA: Diagnosis not present

## 2018-06-20 DIAGNOSIS — J9601 Acute respiratory failure with hypoxia: Secondary | ICD-10-CM

## 2018-06-20 DIAGNOSIS — I639 Cerebral infarction, unspecified: Secondary | ICD-10-CM

## 2018-06-20 DIAGNOSIS — D649 Anemia, unspecified: Secondary | ICD-10-CM

## 2018-06-20 DIAGNOSIS — N183 Chronic kidney disease, stage 3 (moderate): Secondary | ICD-10-CM

## 2018-06-20 DIAGNOSIS — C3490 Malignant neoplasm of unspecified part of unspecified bronchus or lung: Secondary | ICD-10-CM

## 2018-06-20 DIAGNOSIS — D696 Thrombocytopenia, unspecified: Secondary | ICD-10-CM

## 2018-06-21 DIAGNOSIS — I10 Essential (primary) hypertension: Secondary | ICD-10-CM | POA: Diagnosis not present

## 2018-06-21 DIAGNOSIS — I639 Cerebral infarction, unspecified: Secondary | ICD-10-CM | POA: Diagnosis not present

## 2018-06-21 DIAGNOSIS — E785 Hyperlipidemia, unspecified: Secondary | ICD-10-CM

## 2018-06-21 DIAGNOSIS — E871 Hypo-osmolality and hyponatremia: Secondary | ICD-10-CM

## 2018-06-21 DIAGNOSIS — N179 Acute kidney failure, unspecified: Secondary | ICD-10-CM

## 2018-06-22 DIAGNOSIS — I10 Essential (primary) hypertension: Secondary | ICD-10-CM | POA: Diagnosis not present

## 2018-06-22 DIAGNOSIS — E785 Hyperlipidemia, unspecified: Secondary | ICD-10-CM | POA: Diagnosis not present

## 2018-06-22 DIAGNOSIS — I639 Cerebral infarction, unspecified: Secondary | ICD-10-CM | POA: Diagnosis not present

## 2018-06-22 DIAGNOSIS — E871 Hypo-osmolality and hyponatremia: Secondary | ICD-10-CM | POA: Diagnosis not present

## 2018-06-23 ENCOUNTER — Ambulatory Visit: Payer: Medicare Other | Admitting: Pulmonary Disease

## 2018-06-26 ENCOUNTER — Encounter (HOSPITAL_COMMUNITY): Payer: Self-pay | Admitting: *Deleted

## 2018-06-26 ENCOUNTER — Emergency Department (HOSPITAL_COMMUNITY): Payer: Medicare Other

## 2018-06-26 ENCOUNTER — Inpatient Hospital Stay (HOSPITAL_COMMUNITY)
Admission: EM | Admit: 2018-06-26 | Discharge: 2018-06-29 | DRG: 064 | Disposition: A | Discharge status: Home Health | Payer: Medicare Other | Attending: Internal Medicine | Admitting: Internal Medicine

## 2018-06-26 ENCOUNTER — Other Ambulatory Visit: Payer: Self-pay

## 2018-06-26 DIAGNOSIS — Z803 Family history of malignant neoplasm of breast: Secondary | ICD-10-CM

## 2018-06-26 DIAGNOSIS — N189 Chronic kidney disease, unspecified: Secondary | ICD-10-CM

## 2018-06-26 DIAGNOSIS — E871 Hypo-osmolality and hyponatremia: Secondary | ICD-10-CM

## 2018-06-26 DIAGNOSIS — E669 Obesity, unspecified: Secondary | ICD-10-CM | POA: Diagnosis present

## 2018-06-26 DIAGNOSIS — D696 Thrombocytopenia, unspecified: Secondary | ICD-10-CM

## 2018-06-26 DIAGNOSIS — D6859 Other primary thrombophilia: Secondary | ICD-10-CM | POA: Diagnosis present

## 2018-06-26 DIAGNOSIS — Z87891 Personal history of nicotine dependence: Secondary | ICD-10-CM

## 2018-06-26 DIAGNOSIS — G459 Transient cerebral ischemic attack, unspecified: Secondary | ICD-10-CM | POA: Diagnosis present

## 2018-06-26 DIAGNOSIS — R29810 Facial weakness: Secondary | ICD-10-CM | POA: Diagnosis present

## 2018-06-26 DIAGNOSIS — I129 Hypertensive chronic kidney disease with stage 1 through stage 4 chronic kidney disease, or unspecified chronic kidney disease: Secondary | ICD-10-CM | POA: Diagnosis present

## 2018-06-26 DIAGNOSIS — D61818 Other pancytopenia: Secondary | ICD-10-CM | POA: Diagnosis present

## 2018-06-26 DIAGNOSIS — Z8673 Personal history of transient ischemic attack (TIA), and cerebral infarction without residual deficits: Secondary | ICD-10-CM

## 2018-06-26 DIAGNOSIS — I63432 Cerebral infarction due to embolism of left posterior cerebral artery: Secondary | ICD-10-CM | POA: Diagnosis not present

## 2018-06-26 DIAGNOSIS — G8194 Hemiplegia, unspecified affecting left nondominant side: Secondary | ICD-10-CM | POA: Diagnosis present

## 2018-06-26 DIAGNOSIS — Z7982 Long term (current) use of aspirin: Secondary | ICD-10-CM

## 2018-06-26 DIAGNOSIS — K449 Diaphragmatic hernia without obstruction or gangrene: Secondary | ICD-10-CM | POA: Diagnosis present

## 2018-06-26 DIAGNOSIS — E785 Hyperlipidemia, unspecified: Secondary | ICD-10-CM | POA: Diagnosis present

## 2018-06-26 DIAGNOSIS — R4781 Slurred speech: Secondary | ICD-10-CM | POA: Diagnosis present

## 2018-06-26 DIAGNOSIS — R471 Dysarthria and anarthria: Secondary | ICD-10-CM | POA: Diagnosis present

## 2018-06-26 DIAGNOSIS — K5730 Diverticulosis of large intestine without perforation or abscess without bleeding: Secondary | ICD-10-CM | POA: Diagnosis present

## 2018-06-26 DIAGNOSIS — I4891 Unspecified atrial fibrillation: Secondary | ICD-10-CM

## 2018-06-26 DIAGNOSIS — R918 Other nonspecific abnormal finding of lung field: Secondary | ICD-10-CM | POA: Diagnosis not present

## 2018-06-26 DIAGNOSIS — Z8249 Family history of ischemic heart disease and other diseases of the circulatory system: Secondary | ICD-10-CM

## 2018-06-26 DIAGNOSIS — E46 Unspecified protein-calorie malnutrition: Secondary | ICD-10-CM

## 2018-06-26 DIAGNOSIS — I639 Cerebral infarction, unspecified: Secondary | ICD-10-CM

## 2018-06-26 DIAGNOSIS — I700 Atherosclerosis of aorta: Secondary | ICD-10-CM | POA: Diagnosis present

## 2018-06-26 DIAGNOSIS — H5347 Heteronymous bilateral field defects: Secondary | ICD-10-CM | POA: Diagnosis present

## 2018-06-26 DIAGNOSIS — N183 Chronic kidney disease, stage 3 (moderate): Secondary | ICD-10-CM | POA: Diagnosis present

## 2018-06-26 DIAGNOSIS — I480 Paroxysmal atrial fibrillation: Secondary | ICD-10-CM | POA: Diagnosis not present

## 2018-06-26 DIAGNOSIS — Z85118 Personal history of other malignant neoplasm of bronchus and lung: Secondary | ICD-10-CM

## 2018-06-26 DIAGNOSIS — R29703 NIHSS score 3: Secondary | ICD-10-CM | POA: Diagnosis present

## 2018-06-26 DIAGNOSIS — I10 Essential (primary) hypertension: Secondary | ICD-10-CM | POA: Diagnosis not present

## 2018-06-26 DIAGNOSIS — Z9221 Personal history of antineoplastic chemotherapy: Secondary | ICD-10-CM

## 2018-06-26 DIAGNOSIS — Z6831 Body mass index (BMI) 31.0-31.9, adult: Secondary | ICD-10-CM

## 2018-06-26 DIAGNOSIS — R0682 Tachypnea, not elsewhere classified: Secondary | ICD-10-CM

## 2018-06-26 DIAGNOSIS — Z923 Personal history of irradiation: Secondary | ICD-10-CM

## 2018-06-26 DIAGNOSIS — J189 Pneumonia, unspecified organism: Secondary | ICD-10-CM | POA: Diagnosis present

## 2018-06-26 DIAGNOSIS — E8809 Other disorders of plasma-protein metabolism, not elsewhere classified: Secondary | ICD-10-CM

## 2018-06-26 DIAGNOSIS — D62 Acute posthemorrhagic anemia: Secondary | ICD-10-CM

## 2018-06-26 HISTORY — DX: Malignant (primary) neoplasm, unspecified: C80.1

## 2018-06-26 HISTORY — DX: Unspecified visual loss: H54.7

## 2018-06-26 HISTORY — DX: Hypo-osmolality and hyponatremia: E87.1

## 2018-06-26 HISTORY — DX: Unspecified atrial fibrillation: I48.91

## 2018-06-26 HISTORY — DX: Thrombocytopenia, unspecified: D69.6

## 2018-06-26 HISTORY — DX: Anemia, unspecified: D64.9

## 2018-06-26 HISTORY — DX: Malignant neoplasm of unspecified part of unspecified bronchus or lung: C34.90

## 2018-06-26 HISTORY — DX: Acute kidney failure, unspecified: N17.9

## 2018-06-26 HISTORY — DX: Chronic kidney disease, stage 3 unspecified: N18.30

## 2018-06-26 HISTORY — DX: Cerebral infarction, unspecified: I63.9

## 2018-06-26 HISTORY — DX: Chronic kidney disease, stage 3 (moderate): N18.3

## 2018-06-26 LAB — COMPREHENSIVE METABOLIC PANEL
ALT: 12 U/L (ref 0–44)
AST: 16 U/L (ref 15–41)
Albumin: 2.3 g/dL — ABNORMAL LOW (ref 3.5–5.0)
Alkaline Phosphatase: 64 U/L (ref 38–126)
Anion gap: 10 (ref 5–15)
BUN: 11 mg/dL (ref 8–23)
CO2: 25 mmol/L (ref 22–32)
Calcium: 8.5 mg/dL — ABNORMAL LOW (ref 8.9–10.3)
Chloride: 105 mmol/L (ref 98–111)
Creatinine, Ser: 1.11 mg/dL — ABNORMAL HIGH (ref 0.44–1.00)
GFR calc non Af Amer: 50 mL/min — ABNORMAL LOW (ref >60)
GFR, EST AFRICAN AMERICAN: 57 mL/min — AB (ref >60)
Glucose, Bld: 113 mg/dL — ABNORMAL HIGH (ref 70–99)
Potassium: 4 mmol/L (ref 3.5–5.1)
Sodium: 140 mmol/L (ref 135–145)
TOTAL PROTEIN: 5.8 g/dL — AB (ref 6.5–8.1)
Total Bilirubin: 0.7 mg/dL (ref 0.3–1.2)

## 2018-06-26 LAB — CBC
HCT: 34.6 % — ABNORMAL LOW (ref 36.0–46.0)
Hemoglobin: 10.8 g/dL — ABNORMAL LOW (ref 12.0–15.0)
MCH: 30.4 pg (ref 26.0–34.0)
MCHC: 31.2 g/dL (ref 30.0–36.0)
MCV: 97.5 fL (ref 80.0–100.0)
Platelets: 88 10*3/uL — ABNORMAL LOW (ref 150–400)
RBC: 3.55 MIL/uL — ABNORMAL LOW (ref 3.87–5.11)
RDW: 15.5 % (ref 11.5–15.5)
WBC: 7.5 10*3/uL (ref 4.0–10.5)
nRBC: 0 % (ref 0.0–0.2)

## 2018-06-26 LAB — DIFFERENTIAL
Abs Immature Granulocytes: 0.07 10*3/uL (ref 0.00–0.07)
Basophils Absolute: 0 10*3/uL (ref 0.0–0.1)
Basophils Relative: 0 %
Eosinophils Absolute: 0.2 10*3/uL (ref 0.0–0.5)
Eosinophils Relative: 3 %
Immature Granulocytes: 1 %
Lymphocytes Relative: 6 %
Lymphs Abs: 0.5 10*3/uL — ABNORMAL LOW (ref 0.7–4.0)
Monocytes Absolute: 0.9 10*3/uL (ref 0.1–1.0)
Monocytes Relative: 13 %
Neutro Abs: 5.8 10*3/uL (ref 1.7–7.7)
Neutrophils Relative %: 77 %

## 2018-06-26 LAB — PROTIME-INR
INR: 1.14
Prothrombin Time: 14.5 seconds (ref 11.4–15.2)

## 2018-06-26 LAB — APTT: aPTT: 31 seconds (ref 24–36)

## 2018-06-26 LAB — I-STAT TROPONIN, ED: Troponin i, poc: 0.05 ng/mL (ref 0.00–0.08)

## 2018-06-26 LAB — ETHANOL: Alcohol, Ethyl (B): <10 mg/dL (ref <10)

## 2018-06-26 LAB — I-STAT CREATININE, ED: Creatinine, Ser: 1.1 mg/dL — ABNORMAL HIGH (ref 0.44–1.00)

## 2018-06-26 MED ORDER — PANTOPRAZOLE SODIUM 40 MG PO TBEC
40.0000 mg | DELAYED_RELEASE_TABLET | Freq: Every day | ORAL | Status: DC
  Administered 2018-06-27 – 2018-06-29 (×3): 40 mg via ORAL
  Filled 2018-06-26 (×3): qty 1

## 2018-06-26 MED ORDER — ENOXAPARIN SODIUM 40 MG/0.4ML ~~LOC~~ SOLN
40.0000 mg | SUBCUTANEOUS | Status: DC
  Administered 2018-06-26 – 2018-06-27 (×2): 40 mg via SUBCUTANEOUS
  Filled 2018-06-26 (×2): qty 0.4

## 2018-06-26 MED ORDER — AMLODIPINE BESYLATE 5 MG PO TABS
5.0000 mg | ORAL_TABLET | Freq: Every day | ORAL | Status: DC
  Administered 2018-06-27 – 2018-06-29 (×3): 5 mg via ORAL
  Filled 2018-06-26 (×3): qty 1

## 2018-06-26 MED ORDER — ENOXAPARIN SODIUM 30 MG/0.3ML ~~LOC~~ SOLN: 30.0000 mg | SUBCUTANEOUS | Status: DC

## 2018-06-26 MED ORDER — SODIUM CHLORIDE 0.9% FLUSH: 3.0000 mL | Freq: Once | INTRAVENOUS | Status: DC

## 2018-06-26 MED ORDER — ASPIRIN EC 81 MG PO TBEC
81.0000 mg | DELAYED_RELEASE_TABLET | Freq: Every day | ORAL | Status: DC
  Administered 2018-06-27: 81 mg via ORAL
  Filled 2018-06-26: qty 1

## 2018-06-26 MED ORDER — SODIUM CHLORIDE 0.9 % IV SOLN
INTRAVENOUS | Status: DC
  Administered 2018-06-26 – 2018-06-28 (×2): via INTRAVENOUS

## 2018-06-26 MED ORDER — STROKE: EARLY STAGES OF RECOVERY BOOK
Freq: Once | Status: AC
  Administered 2018-06-26: 22:00:00
  Filled 2018-06-26: qty 1

## 2018-06-26 MED ORDER — IBUPROFEN 200 MG PO TABS: 400.0000 mg | ORAL_TABLET | Freq: Four times a day (QID) | ORAL | Status: DC | PRN

## 2018-06-26 MED ORDER — CLOPIDOGREL BISULFATE 75 MG PO TABS
75.0000 mg | ORAL_TABLET | Freq: Every day | ORAL | Status: DC
  Administered 2018-06-27: 75 mg via ORAL
  Filled 2018-06-26 (×2): qty 1

## 2018-06-26 MED ORDER — CARVEDILOL 12.5 MG PO TABS
12.5000 mg | ORAL_TABLET | Freq: Two times a day (BID) | ORAL | Status: DC
  Administered 2018-06-27 – 2018-06-29 (×5): 12.5 mg via ORAL
  Filled 2018-06-26 (×5): qty 1

## 2018-06-26 MED ORDER — ACETAMINOPHEN 325 MG PO TABS: 650.0000 mg | ORAL_TABLET | Freq: Four times a day (QID) | ORAL | Status: DC | PRN

## 2018-06-26 MED ORDER — MAGNESIUM OXIDE 400 (241.3 MG) MG PO TABS
400.0000 mg | ORAL_TABLET | Freq: Two times a day (BID) | ORAL | Status: DC
  Administered 2018-06-27 – 2018-06-29 (×5): 400 mg via ORAL
  Filled 2018-06-26 (×6): qty 1

## 2018-06-26 MED ORDER — LEVOFLOXACIN 500 MG PO TABS
750.0000 mg | ORAL_TABLET | Freq: Every day | ORAL | Status: DC
  Administered 2018-06-27 – 2018-06-29 (×3): 750 mg via ORAL
  Filled 2018-06-26 (×3): qty 2

## 2018-06-26 MED ORDER — ATORVASTATIN CALCIUM 10 MG PO TABS
20.0000 mg | ORAL_TABLET | Freq: Every day | ORAL | Status: DC
  Administered 2018-06-27 – 2018-06-29 (×3): 20 mg via ORAL
  Filled 2018-06-26 (×3): qty 2

## 2018-06-26 NOTE — H&P (Signed)
History and Physical    LIZABETH FELLNER HWT:[id number] DOB: [birth year] DOA: 06/26/2018  PCP: Ronita Hipps, MD  Patient coming from: home   I have personally briefly reviewed patient's old medical records available.   Chief Complaint: left hand weakness, slurred speech   HPI: Michele [name] is a 73 y.o. female with medical history significant of hypertension, hyperlipidemia, lung cancer status post chemo and radiation who finished chemotherapy on November 2019, thrombocytopenia, recent admission to [facility] for pneumonia and then subsequent stroke.  She stayed in the hospital about 2 days.  She was discharged on 06/22/2018 on aspirin and a statin.  She had improved her right leg weakness and was getting physical therapy at home.  Today morning at about 915 she went to kitchen and was at her usual state of health.  She suddenly felt her left arm is useless, her husband noted facial drooping on the left side and also her voice was slurred.  EMS was called.  By the time EMS arrived, she had no residual symptoms.  Her voice was clear.  Patient did not receive TPA at [facility], she has residual right hemianopsia. Patient stated history of irregular heart rate one time when her blood pressure was high and she had fever.  She does not have otherwise recorded history of A. fib.  At [facility] on discharge, they discussed about starting Eliquis, however her platelet counts were low so they decided to call her for loop recorder placement and she is a scheduled that for next week. ED Course: Hemodynamically stable in the emergency room.  Renal functions were stable.  Seen by stroke team at the door.  Emergent CTA showed known left frontal and occipital lobe infarction and no new findings.  Her neurological deficits almost completely resolved.  She was not a TPA candidate.  Review of Systems: As per HPI otherwise 10 point review of systems negative.    Past Medical History:  Diagnosis Date  . Acute kidney failure,  unspecified ([place])   . Cancer ([place])    lung  . CVA (cerebral vascular accident) ([place])   . Hyperlipidemia   . Hypertension   . Hyponatremia   . Lung cancer ([place])   . Stroke ([place])   . Thrombocytopenia, unspecified ([place])     Past Surgical History:  Procedure Laterality Date  . ENDOBRONCHIAL ULTRASOUND Bilateral 01/08/2018   Procedure: ENDOBRONCHIAL ULTRASOUND;  Surgeon: Garner Nash, DO;  Location: [facility] ENDOSCOPY;  Service: Cardiopulmonary;  Laterality: Bilateral;  . FINE NEEDLE ASPIRATION  01/08/2018   Procedure: FINE NEEDLE ASPIRATION (FNA) LINEAR;  Surgeon: Garner Nash, DO;  Location: [facility] ENDOSCOPY;  Service: Cardiopulmonary;;  . FLEXIBLE BRONCHOSCOPY  01/08/2018   Procedure: FLEXIBLE BRONCHOSCOPY;  Surgeon: Garner Nash, DO;  Location: [facility] ENDOSCOPY;  Service: Cardiopulmonary;;  . TUBAL LIGATION       reports that she quit smoking about 10 years ago. Her smoking use included cigarettes. She has a 25.00 pack-year smoking history. She has never used smokeless tobacco. She reports that she does not drink alcohol or use drugs.  No Known Allergies  Family History  Problem Relation Age of Onset  . Dementia Mother   . Breast cancer Mother   . Heart attack Father      Prior to Admission medications   Medication Sig Start Date End Date Taking? Authorizing Provider  acetaminophen (TYLENOL) 325 MG tablet Take 650 mg by mouth every 6 (six) hours as needed for fever.   Yes [provider]  amLODipine (NORVASC) 5 MG tablet Take 5 mg by mouth daily.   Yes [provider]  aspirin EC 81 MG tablet Take 81 mg by mouth daily.   Yes [provider]  atorvastatin (LIPITOR) 20 MG tablet Take 20 mg by mouth daily. 11/19/17  Yes [provider]  carvedilol (COREG) 12.5 MG tablet Take 12.5 mg by mouth 2 (two) times daily with a meal.   Yes [provider]  ibuprofen (ADVIL,MOTRIN) 200 MG tablet Take 400 mg by mouth every 6 (six) hours as needed for  headache or moderate pain.   Yes [provider]  levofloxacin (LEVAQUIN) 750 MG tablet Take 750 mg by mouth daily.   Yes [provider]  Magnesium Oxide 400 (240 Mg) MG TABS Take 400 mg by mouth 2 (two) times daily.   Yes [provider]  omeprazole (PRILOSEC) 20 MG capsule Take 20 mg by mouth daily.   Yes [provider]    Physical Exam: Vitals:   06/26/18 1400 06/26/18 1415 06/26/18 1430 06/26/18 1445  BP: (!) 102/55 119/66 114/63 108/61  Pulse: 77 88 84 83  Resp: (!) 24 (!) 22 (!) 23 19  Temp:      SpO2: 98% 97% 97% 96%  Weight:      Height:        Constitutional: NAD, calm, comfortable Vitals:   06/26/18 1400 06/26/18 1415 06/26/18 1430 06/26/18 1445  BP: (!) 102/55 119/66 114/63 108/61  Pulse: 77 88 84 83  Resp: (!) 24 (!) 22 (!) 23 19  Temp:      SpO2: 98% 97% 97% 96%  Weight:      Height:       Eyes: PERRL, lids and conjunctivae normal ENMT: Mucous membranes are moist. Posterior pharynx clear of any exudate or lesions.Normal dentition.  Neck: normal, supple, no masses, no thyromegaly Respiratory: clear to auscultation bilaterally, no wheezing, no crackles. Normal respiratory effort. No accessory muscle use.  Cardiovascular: Regular rate and rhythm, no murmurs / rubs / gallops. No extremity edema. 2+ pedal pulses. No carotid bruits.  Abdomen: no tenderness, no masses palpated. No hepatosplenomegaly. Bowel sounds positive.  Musculoskeletal: no clubbing / cyanosis. No joint deformity upper and lower extremities. Good ROM, no contractures. Normal muscle tone.  Skin: no rashes, lesions, ulcers. No induration Neurologic: Right lateral visual field deficit, otherwise cranial nerves intact.  No facial droop.  Sensation intact, DTR normal. Strength 5/5 in all 4.  Psychiatric: Normal judgment and insight. Alert and oriented x 3. Normal mood.     Labs on Admission: I have personally reviewed following labs and imaging  studies  CBC: Recent Labs  Lab 06/26/18 1139  WBC 7.5  NEUTROABS 5.8  HGB 10.8*  HCT 34.6*  MCV 97.5  PLT 88*   Basic Metabolic Panel: Recent Labs  Lab 06/26/18 1057 06/26/18 1139  NA  --  140  K  --  4.0  CL  --  105  CO2  --  25  GLUCOSE  --  113*  BUN  --  11  CREATININE 1.10* 1.11*  CALCIUM  --  8.5*   GFR: Estimated Creatinine Clearance: 47.5 mL/min (A) (by C-G formula based on SCr of 1.11 mg/dL (H)). Liver Function Tests: Recent Labs  Lab 06/26/18 1139  AST 16  ALT 12  ALKPHOS 64  BILITOT 0.7  PROT 5.8*  ALBUMIN 2.3*   No results for input(s): LIPASE, AMYLASE in the last 168 hours. No results  for input(s): AMMONIA in the last 168 hours. Coagulation Profile: Recent Labs  Lab 06/26/18 1139  INR 1.14   Cardiac Enzymes: No results for input(s): CKTOTAL, CKMB, CKMBINDEX, TROPONINI in the last 168 hours. BNP (last 3 results) No results for input(s): PROBNP in the last 8760 hours. HbA1C: No results for input(s): HGBA1C in the last 72 hours. CBG: No results for input(s): GLUCAP in the last 168 hours. Lipid Profile: No results for input(s): CHOL, HDL, LDLCALC, TRIG, CHOLHDL, LDLDIRECT in the last 72 hours. Thyroid Function Tests: No results for input(s): TSH, T4TOTAL, FREET4, T3FREE, THYROIDAB in the last 72 hours. Anemia Panel: No results for input(s): VITAMINB12, FOLATE, FERRITIN, TIBC, IRON, RETICCTPCT in the last 72 hours. Urine analysis: No results found for: COLORURINE, APPEARANCEUR, LABSPEC, PHURINE, GLUCOSEU, HGBUR, BILIRUBINUR, KETONESUR, PROTEINUR, UROBILINOGEN, NITRITE, LEUKOCYTESUR  Radiological Exams on Admission: Ct Head Code Stroke Wo Contrast  Result Date: 06/26/2018 CLINICAL DATA:  Code stroke.  Left-sided droop and slurred speech EXAM: CT HEAD WITHOUT CONTRAST TECHNIQUE: Contiguous axial images were obtained from the base of the skull through the vertex without intravenous contrast. COMPARISON:  06/20/2018 FINDINGS: Brain: Known  subacute infarct in the left PCA distribution affecting the occipital lobe. Small early subacute infarcts seen in the posterior right frontal cortex. Small recent right cerebellar infarct. No acute hemorrhage, hydrocephalus, or collection. Vascular: No hyperdense vessel Skull: Negative Sinuses/Orbits: Secretions layer within the maxillary sinuses. There is bilateral ethmoid and maxillary opacification. Partial bilateral mastoid opacification. These findings were seen on prior brain MRI. Other: These results were communicated to Dr. Cheral Marker at 11:06 amon 2/7/2020by text page via the [facility] messaging system. ASPECTS [facility] Stroke Program Early CT Score) Not scored in this clinical setting. IMPRESSION: 1. Known subacute infarcts in the left occipital cortex, right posterior frontal cortex, and right cerebellum. 2. No evidence of interval infarct or hemorrhagic conversion. 3. Active sinusitis. Electronically Signed   By: Monte Fantasia M.D.   On: 06/26/2018 11:08    EKG: Independently reviewed. Sinus rhythm, no acute changes   Assessment/Plan Principal Problem:   TIA (transient ischemic attack) Active Problems:   Mass of lower lobe of left lung   Essential hypertension   Hyperlipidemia   Pancytopenia ([place])     1. TIA with recent history of a stroke. Admit to monitored unit because of severity of symptoms. Neurochecks and vital signs as per stroke protocol. Patient was not a TPA and vascular intervention candidate because of recently stroke and minimal deficits.  Drinking water with husband at the bedside.  Will have bedside swallow evaluation and start patient on cardiac diet, resume aspirin and a statin. Blood pressures at goal. consultations, neurology, speech, PT OT MRI of the brain, may not be needed.  Will defer to neurology. Patient was found to have moderate left-sided carotid artery disease, will not repeat. Echocardiogram within a week. May benefit with additional anticoagulation, will  discuss with neurology.  2.  Lung cancer status post chemo and radiation: Is stabilized now.  3.  Pancytopenia: Her blood counts are improving.  4.  Hyperlipidemia: On a statin.  Resume.  5.  Hypertension: Resume home medications.  No active stroke.    DVT prophylaxis: Lovenox. Code Status: Full code. Family Communication: Husband and daughter at the bedside. Disposition Plan: Home with home care. Consults called: Neurology. Admission status: Observation, telemetry.   Barb Merino MD [facility] Pager [phone]  If 7PM-7AM, please contact night-coverage [email or web address] Password [email or web address]  06/26/2018, 3:07 PM

## 2018-06-26 NOTE — Discharge Instructions (Addendum)
Information on my medicine - XARELTO (Rivaroxaban)  This medication education was reviewed with me or my healthcare representative as part of my discharge preparation.  The pharmacist that spoke with me during my hospital stay was:  Bertis Ruddy, [facility]  Why was Xarelto prescribed for you? Xarelto was prescribed for you to reduce the risk of a blood clot forming that can cause a stroke if you have a medical condition called atrial fibrillation (a type of irregular heartbeat).  What do you need to know about xarelto ? Take your Xarelto ONCE DAILY at the same time every day with your evening meal. If you have difficulty swallowing the tablet whole, you may crush it and mix in applesauce just prior to taking your dose.  Take Xarelto exactly as prescribed by your doctor and DO NOT stop taking Xarelto without talking to the doctor who prescribed the medication.  Stopping without other stroke prevention medication to take the place of Xarelto may increase your risk of developing a clot that causes a stroke.  Refill your prescription before you run out.  After discharge, you should have regular check-up appointments with your healthcare provider that is prescribing your Xarelto.  In the future your dose may need to be changed if your kidney function or weight changes by a significant amount.  What do you do if you miss a dose? If you are taking Xarelto ONCE DAILY and you miss a dose, take it as soon as you remember on the same day then continue your regularly scheduled once daily regimen the next day. Do not take two doses of Xarelto at the same time or on the same day.   Important Safety Information A possible side effect of Xarelto is bleeding. You should call your healthcare provider right away if you experience any of the following: ? Bleeding from an injury or your nose that does not stop. ? Unusual colored urine (red or dark brown) or unusual colored stools (red or black). ? Unusual  bruising for unknown reasons. ? A serious fall or if you hit your head (even if there is no bleeding).  Some medicines may interact with Xarelto and might increase your risk of bleeding while on Xarelto. To help avoid this, consult your healthcare provider or pharmacist prior to using any new prescription or non-prescription medications, including herbals, vitamins, non-steroidal anti-inflammatory drugs (NSAIDs) and supplements.  This website has more information on Xarelto: [email or web address]

## 2018-06-26 NOTE — Code Documentation (Signed)
73yo female arriving to [facility] via [place] at [phone]. Patient from home where she went to the kitchen at her baseline at 0915. At [place], patient reported left hand numbness, and her husband noted slurred speech. EMS was called and activated a code stroke. EMS reports improvement en route. Of note, patient with h/o recent infarct last week. She was admitted to [facility] and was discharged on Monday per husband. Patient reports right visual deficit since the stroke. Patient with h/o lung cancer s/p chemotherapy and radiation. Stroke team at the bedside on patient arrival. Labs attempted and patient to CT with team. CT completed. NIHSS 2, see documentation for details and code stroke times. Patient with right hemianopsia on exam. Patient is contraindicated for tPA due to history of recent stroke. Patient is not an endovascular candidate at this time. Bedside handoff with ED RN Claiborne Billings.

## 2018-06-26 NOTE — ED Triage Notes (Signed)
Patient was admitted to [facility] with CVA last Sat and discharged on Monday, this am walked to the table to eat breakfast at 0915 and her left hand started getting numb and and she had slurred speech. Ems states 10 ins into transport patients speech started clearing up.upon arrival to ED patient has clear speech and is back to her baseline. Alert oriented moves all ext x 4.

## 2018-06-26 NOTE — ED Provider Notes (Signed)
[facility] EMERGENCY DEPARTMENT Provider Note   CSN: [id number] Arrival date & time: 06/26/18  1043   An emergency department physician performed an initial assessment on this suspected stroke patient at 1050.  History   Chief Complaint Chief Complaint  Patient presents with  . Code Stroke    HPI Michele [name] is a 73 y.o. female presented today with left hand numbness and slurred speech.  Patient diagnosed with CVA 4 days ago, was discharged with neurology follow-up.  Husband who presents with patient states that earlier this morning they noticed that she could not use her left hand and she was having increased slurred speech, EMS was called patient brought to this ER.  Upon arrival patient was met by stroke team and taken for CT.  CT shows subacute infarct.  Patient symptoms began resolving, appears that patient with TIA.  HPI  Past Medical History:  Diagnosis Date  . Acute kidney failure, unspecified ([place])   . Cancer ([place])    lung  . CVA (cerebral vascular accident) ([place])   . Hyperlipidemia   . Hypertension   . Hyponatremia   . Lung cancer ([place])   . Stroke ([place])   . Thrombocytopenia, unspecified [facility])     Patient Active Problem List   Diagnosis Date Noted  . TIA (transient ischemic attack) 06/26/2018  . Essential hypertension 06/26/2018  . Hyperlipidemia 06/26/2018  . Pancytopenia ([place]) 06/26/2018  . Mediastinal adenopathy 01/05/2018  . Mass of lower lobe of left lung 01/05/2018  . Hilar adenopathy 01/05/2018  . Abnormal PET of left lung 01/05/2018  . Abnormal PET scan of mediastinum 01/05/2018    Past Surgical History:  Procedure Laterality Date  . ENDOBRONCHIAL ULTRASOUND Bilateral 01/08/2018   Procedure: ENDOBRONCHIAL ULTRASOUND;  Surgeon: Garner Nash, DO;  Location: [facility];  Service: Cardiopulmonary;  Laterality: Bilateral;  . FINE NEEDLE ASPIRATION  01/08/2018   Procedure: FINE NEEDLE ASPIRATION (FNA) LINEAR;  Surgeon:  Garner Nash, DO;  Location: [facility];  Service: Cardiopulmonary;;  . FLEXIBLE BRONCHOSCOPY  01/08/2018   Procedure: FLEXIBLE BRONCHOSCOPY;  Surgeon: Garner Nash, DO;  Location: [facility];  Service: Cardiopulmonary;;  . TUBAL LIGATION       OB History   No obstetric history on file.      Home Medications    Prior to Admission medications   Medication Sig Start Date End Date Taking? Authorizing Provider  acetaminophen (TYLENOL) 325 MG tablet Take 650 mg by mouth every 6 (six) hours as needed for fever.   Yes [provider]  amLODipine (NORVASC) 5 MG tablet Take 5 mg by mouth daily.   Yes [provider]  aspirin EC 81 MG tablet Take 81 mg by mouth daily.   Yes [provider]  atorvastatin (LIPITOR) 20 MG tablet Take 20 mg by mouth daily. 11/19/17  Yes [provider]  carvedilol (COREG) 12.5 MG tablet Take 12.5 mg by mouth 2 (two) times daily with a meal.   Yes [provider]  ibuprofen (ADVIL,MOTRIN) 200 MG tablet Take 400 mg by mouth every 6 (six) hours as needed for headache or moderate pain.   Yes [provider]  levofloxacin (LEVAQUIN) 750 MG tablet Take 750 mg by mouth daily.   Yes [provider]  Magnesium Oxide 400 (240 Mg) MG TABS Take 400 mg by mouth 2 (two) times daily.   Yes [provider]  omeprazole (PRILOSEC) 20 MG capsule Take 20 mg by mouth daily.  Yes [provider]    Family History Family History  Problem Relation Age of Onset  . Dementia Mother   . Breast cancer Mother   . Heart attack Father     Social History Social History   Tobacco Use  . Smoking status: Former Smoker    Packs/day: 1.00    Years: 25.00    Pack years: 25.00    Types: Cigarettes    Last attempt to quit: 05/20/2008    Years since quitting: 10.1  . Smokeless tobacco: Never Used  Substance Use Topics  . Alcohol use: No  . Drug use: No     Allergies   Patient has no known  allergies.   Review of Systems Review of Systems  Constitutional: Negative.  Negative for chills and fever.  Respiratory: Negative.  Negative for cough, choking and shortness of breath.   Cardiovascular: Negative.  Negative for chest pain.  Gastrointestinal: Negative.  Negative for abdominal pain, diarrhea, nausea and vomiting.  Genitourinary: Negative.  Negative for dysuria and hematuria.  Musculoskeletal: Negative.  Negative for back pain, neck pain and neck stiffness.  Neurological: Positive for speech difficulty, weakness and numbness.  All other systems reviewed and are negative.  Physical Exam Updated Vital Signs BP 108/61   Pulse 83   Temp 98.1 F (36.7 C)   Resp 19   Ht _0  (1.626 m)   Wt 82.1 kg   SpO2 96%   BMI 31.05 kg/m   Physical Exam Constitutional:      General: She is not in acute distress.    Appearance: She is well-developed. She is obese. She is not ill-appearing or diaphoretic.  HENT:     Head: Normocephalic and atraumatic.     Right Ear: External ear normal.     Left Ear: External ear normal.     Nose: Nose normal.     Mouth/Throat:     Mouth: Mucous membranes are moist.     Pharynx: Oropharynx is clear.  Eyes:     Extraocular Movements: Extraocular movements intact.     Conjunctiva/sclera: Conjunctivae normal.     Pupils: Pupils are equal, round, and reactive to light.  Neck:     Musculoskeletal: Normal range of motion.     Trachea: Trachea normal. No tracheal deviation.  Cardiovascular:     Rate and Rhythm: Normal rate and regular rhythm.     Pulses: Normal pulses.     Heart sounds: Normal heart sounds.  Pulmonary:     Effort: Pulmonary effort is normal. No respiratory distress.     Breath sounds: Normal breath sounds. No rhonchi.  Abdominal:     Palpations: Abdomen is soft.     Tenderness: There is no abdominal tenderness. There is no guarding or rebound.  Musculoskeletal: Normal range of motion.  Skin:    General: Skin is warm and  dry.  Neurological:     Mental Status: She is alert and oriented to person, place, and time.     Comments: Mental Status: Alert, oriented, thought content appropriate, able to give a coherent history. Speech fluent without evidence of aphasia. Able to follow 2 step commands without difficulty. Cranial Nerves: II: Peripheral visual fields grossly normal, pupils equal, round, reactive to light III,IV, VI: ptosis not present, extra-ocular motions intact bilaterally V,VII: smile symmetric, eyebrows raise symmetric, facial light touch sensation equal VIII: hearing grossly normal to voice X: uvula elevates symmetrically XI: bilateral shoulder shrug symmetric and strong XII: midline tongue extension without  fassiculations Motor: Normal tone. 5/5 strength in upper and lower extremities bilaterally including strong and equal grip strength and dorsiflexion/plantar flexion Sensory: Sensation intact to light touch in all extremities. CV: distal pulses palpable throughout  Psychiatric:        Mood and Affect: Mood normal.        Behavior: Behavior normal.     ED Treatments / Results  Labs (all labs ordered are listed, but only abnormal results are displayed) Labs Reviewed  CBC - Abnormal; Notable for the following components:      Result Value   RBC 3.55 (*)    Hemoglobin 10.8 (*)    HCT 34.6 (*)    Platelets 88 (*)    All other components within normal limits  DIFFERENTIAL - Abnormal; Notable for the following components:   Lymphs Abs 0.5 (*)    All other components within normal limits  COMPREHENSIVE METABOLIC PANEL - Abnormal; Notable for the following components:   Glucose, Bld 113 (*)    Creatinine, Ser 1.11 (*)    Calcium 8.5 (*)    Total Protein 5.8 (*)    Albumin 2.3 (*)    GFR calc non Af Amer 50 (*)    GFR calc Af Amer 57 (*)    All other components within normal limits  I-STAT CREATININE, ED - Abnormal; Notable for the following components:   Creatinine, Ser 1.10  (*)    All other components within normal limits  PROTIME-INR  APTT  ETHANOL  I-STAT TROPONIN, ED  CBG MONITORING, ED    EKG EKG Interpretation  Date/Time:  Friday June 26 2018 11:40:50 EST Ventricular Rate:  85 PR Interval:    QRS Duration: 75 QT Interval:  360 QTC Calculation: 428 R Axis:   66 Text Interpretation:  Sinus rhythm artifact, SR no acute ischemia Confirmed by Charlesetta Shanks [phone]) on 06/26/2018 3:06:15 PM   Radiology Ct Head Code Stroke Wo Contrast  Result Date: 06/26/2018 CLINICAL DATA:  Code stroke.  Left-sided droop and slurred speech EXAM: CT HEAD WITHOUT CONTRAST TECHNIQUE: Contiguous axial images were obtained from the base of the skull through the vertex without intravenous contrast. COMPARISON:  06/20/2018 FINDINGS: Brain: Known subacute infarct in the left PCA distribution affecting the occipital lobe. Small early subacute infarcts seen in the posterior right frontal cortex. Small recent right cerebellar infarct. No acute hemorrhage, hydrocephalus, or collection. Vascular: No hyperdense vessel Skull: Negative Sinuses/Orbits: Secretions layer within the maxillary sinuses. There is bilateral ethmoid and maxillary opacification. Partial bilateral mastoid opacification. These findings were seen on prior brain MRI. Other: These results were communicated to Dr. Cheral Marker at 11:06 amon 2/7/2020by text page via the [facility] messaging system. ASPECTS [facility] Stroke Program Early CT Score) Not scored in this clinical setting. IMPRESSION: 1. Known subacute infarcts in the left occipital cortex, right posterior frontal cortex, and right cerebellum. 2. No evidence of interval infarct or hemorrhagic conversion. 3. Active sinusitis. Electronically Signed   By: Monte Fantasia M.D.   On: 06/26/2018 11:08    Procedures Procedures (including critical care time)  Medications Ordered in ED Medications  sodium chloride flush (NS) 0.9 % injection 3 mL (has no administration in time  range)     Initial Impression / Assessment and Plan / ED Course  I have reviewed the triage vital signs and the nursing notes.  Pertinent labs & imaging results that were available during my care of the patient were reviewed by me and considered in my medical decision making (  see chart for details).    73 year old female with history of recent stroke presenting today as code stroke via EMS.  Patient met by neurology team immediately and was brought to CT scanner.  Patient with complaint of left upper extremity weakness and slurred speech.  CT head without acute findings.  Patient's deficits seem to have resolved, per neurology team noted.  Patient has experienced a TIA today.  On my evaluation patient without slurred speech, cranial nerves grossly intact bilaterally, movement and strength of all extremities equal bilaterally.  Patient without fever, infectious symptoms or any other complaints today.  Denying any and all pain. - CBC nonacute APTT within normal limits PT/INR within normal limits Ethanol level negative CMP nonacute Troponin negative EKG without acute findings reviewed by Dr. Johnney Killian. - Patient reevaluated resting comfortably no acute distress.  Case discussed with Dr. Johnney Killian who agrees with plan for admission.  Consult called to hospitalist for TIA admission.  Patient seen and evaluated in emergency department by hospitalist, patient has been admitted to hospitalist service for further evaluation treatment.  Note: Portions of this report may have been transcribed using voice recognition software. Every effort was made to ensure accuracy; however, inadvertent computerized transcription errors may still be present. Final Clinical Impressions(s) / ED Diagnoses   Final diagnoses:  TIA (transient ischemic attack)    ED Discharge Orders    None       Gari Crown 06/26/18 1506    Charlesetta Shanks, MD 07/03/18 1425

## 2018-06-26 NOTE — Progress Notes (Signed)
Patient arrived to floor. A&O x4. Intact but very dry and flaky. Family at bedside. Patient states no pain. Nurse will continue to monitor. [place]

## 2018-06-26 NOTE — Consult Note (Addendum)
NEURO HOSPITALIST  CONSULT   Requesting Physician: Dr. Johnney Killian    Chief Complaint: Left sided weakness, numbness and facial droop  History obtained from:  Patient  / EMS  HPI:                                                                                                                                         Michele [name] is an 73 y.o. female  With PMH HTN. HLD, lung cancer ( s/p radiation), CVA ( 05/2018 @ [facility]) who presented to [facility] ED as a code stroke with c/o left side numbness, weakness and left facial droop.  Per EMS and patient she woke up at 0915 this morning and was completely normal. When she went to the table she had a sudden onset of left arm numbness/weakness and there was left facial droop. EMS also noted some slurred speech. These symptoms had resolved by the time the patient reached the bridge. She does take daily ASA.   ED course:  BP:122/66 Creatinine: 1.10 CTH: subacute infarct in left occipital cortex, right posterior frontal cortex and right cerebellum. NO evidence of infarct or hemorrhagic conversion.   Per patient last week at [facility] she was diagnosed with a stroke. She did receive TPA. Has resultant right hemianopsia.   Date last known well:06/26/2018 Time last known well: 0915 tPA Given: No: recent stroke ( past 7 days) Modified Rankin: Rankin Score=1 NIHSS:3  Past Medical History:  Diagnosis Date  . Hyperlipidemia   . Hypertension     Past Surgical History:  Procedure Laterality Date  . ENDOBRONCHIAL ULTRASOUND Bilateral 01/08/2018   Procedure: ENDOBRONCHIAL ULTRASOUND;  Surgeon: Garner Nash, DO;  Location: [facility];  Service: Cardiopulmonary;  Laterality: Bilateral;  . FINE NEEDLE ASPIRATION  01/08/2018   Procedure: FINE NEEDLE ASPIRATION (FNA) LINEAR;  Surgeon: Garner Nash, DO;  Location: [facility];  Service: Cardiopulmonary;;  . FLEXIBLE BRONCHOSCOPY  01/08/2018   Procedure: FLEXIBLE  BRONCHOSCOPY;  Surgeon: Garner Nash, DO;  Location: [facility];  Service: Cardiopulmonary;;  . TUBAL LIGATION      Family History  Problem Relation Age of Onset  . Dementia Mother   . Breast cancer Mother   . Heart attack Father       Social History:  reports that she quit smoking about 10 years ago. Her smoking use included cigarettes. She has a 25.00 pack-year smoking history. She has never used smokeless tobacco. She reports that she does not drink alcohol or use drugs.  Allergies: No Known Allergies  Medications:  Current Facility-Administered Medications  Medication Dose Route Frequency Provider Last Rate Last Dose  . sodium chloride flush (NS) 0.9 % injection 3 mL  3 mL Intravenous Once Charlesetta Shanks, MD       Current Outpatient Medications  Medication Sig Dispense Refill  . acetaminophen (TYLENOL) 325 MG tablet Take 650 mg by mouth every 6 (six) hours as needed for fever.    Marland Kitchen amLODipine (NORVASC) 5 MG tablet Take 5 mg by mouth daily.    Marland Kitchen aspirin EC 81 MG tablet Take 81 mg by mouth daily.    Marland Kitchen atorvastatin (LIPITOR) 20 MG tablet Take 20 mg by mouth daily.  3  . carvedilol (COREG) 12.5 MG tablet Take 12.5 mg by mouth 2 (two) times daily with a meal.    . ibuprofen (ADVIL,MOTRIN) 200 MG tablet Take 400 mg by mouth every 6 (six) hours as needed for headache or moderate pain.    Marland Kitchen levofloxacin (LEVAQUIN) 750 MG tablet Take 750 mg by mouth daily.    . Magnesium Oxide 400 (240 Mg) MG TABS Take 400 mg by mouth 2 (two) times daily.    Marland Kitchen omeprazole (PRILOSEC) 20 MG capsule Take 20 mg by mouth daily.       ROS:                                                                                                                                       ROS was performed and is negative except as noted in HPI    General Examination:                                                                                                       Height _0  (1.626 m), weight 82.1 kg.  HEENT-  Normocephalic, no lesions, without obvious abnormality.  Normal external eye and conjunctiva. Cardiovascular- S1-S2 audible, pulses palpable throughout  Lungs-no rhonchi or wheezing noted, no excessive working breathing.  Saturations within normal limits on 3L oxygen via [place]. Abdomen- All 4 quadrants palpated and non tender Extremities- Warm, dry and intact Musculoskeletal-no joint tenderness, deformity or swelling Skin-warm and dry, no hyperpigmentation, vitiligo, or suspicious lesions  Neurological Examination Mental Status: Alert, oriented, thought content appropriate.naming intact. Comprehension  and attention intact.   Speech fluent without evidence of aphasia.  Able to follow  commands without difficulty. Cranial Nerves: II: Right hemianopsia from recent stroke. PERRL.  III,IV, VI: ptosis not present, extra-ocular motions intact bilaterally. V,VII: smile symmetric, facial light touch  sensation normal bilaterally VIII: hearing normal bilaterally IX,X: uvula midline XI: bilateral shoulder shrug XII: midline tongue extension Motor: Right : Upper extremity   5/5  Left:     Upper extremity   5/5  Lower extremity   5/5   Lower extremity   5/5 Tone and bulk:normal tone throughout; no atrophy noted Sensory: Cool temp and light touch intact throughout, bilaterally Deep Tendon Reflexes: trace in left patella, 2+ right patella, absent bilateral ankle reflexes. 2+ biceps Plantars: Right: downgoing   Left: downgoing Cerebellar: normal finger-to-nose,  and normal heel-to-shin test Gait: deferred   Lab Results: Basic Metabolic Panel: Recent Labs  Lab 06/26/18 1057  CREATININE 1.10*    CBC: No results for input(s): WBC, NEUTROABS, HGB, HCT, MCV, PLT in the last 168 hours. Imaging: Ct Head Code Stroke Wo Contrast  Result Date: 06/26/2018 CLINICAL DATA:  Code stroke.   Left-sided droop and slurred speech EXAM: CT HEAD WITHOUT CONTRAST TECHNIQUE: Contiguous axial images were obtained from the base of the skull through the vertex without intravenous contrast. COMPARISON:  06/20/2018 FINDINGS: Brain: Known subacute infarct in the left PCA distribution affecting the occipital lobe. Small early subacute infarcts seen in the posterior right frontal cortex. Small recent right cerebellar infarct. No acute hemorrhage, hydrocephalus, or collection. Vascular: No hyperdense vessel Skull: Negative Sinuses/Orbits: Secretions layer within the maxillary sinuses. There is bilateral ethmoid and maxillary opacification. Partial bilateral mastoid opacification. These findings were seen on prior brain MRI. Other: These results were communicated to Dr. Cheral Marker at 11:06 amon 2/7/2020by text page via the [facility] messaging system. ASPECTS [facility] Stroke Program Early CT Score) Not scored in this clinical setting. IMPRESSION: 1. Known subacute infarcts in the left occipital cortex, right posterior frontal cortex, and right cerebellum. 2. No evidence of interval infarct or hemorrhagic conversion. 3. Active sinusitis. Electronically Signed   By: Monte Fantasia M.D.   On: 06/26/2018 11:08     Laurey Morale, MSN, NP-C [facility] [phone] 06/26/2018, 11:26 AM   Assessment: DANELLE CURIALE is an 73 y.o. female  With PMH HTN. HLD, lung cancer ( s/p radiation), CVA ( 05/2018 @ [facility]) who presented to [facility] ED as a code stroke with c/o left side numbness, weakness and left facial droop. TPA: not given d/t recent completed stroke. CTH: showed subacute stroke left occipital cortex, right posterior frontal cortex, and right cerebellum. No hemorrhagic conversion. Stroke work-up completed in the past 7 days at [facility]. Given resolution of symptoms this was most likely a TIA.  1. Exam shows no lateralized weakness, numbness, speech deficit or ataxia. Right visual field cut as residual deficit  from recent prior stroke is noted.  2. CT head shows late subacute infarct in the left occipital cortex, as well as recent infarctions in the right posterior frontal cortex and right cerebellum. No evidence of interval infarct or hemorrhagic conversion.  3. Stroke Risk Factors - hypercoagulable state, hyperlipidemia and hypertension    Recommendations: -- BP goal : Permissive HTN upto 220/110 mmHg  -- MRI brain to assess for possible new strokes not detectable on CT -- Continue ASA and add Plavix (ordered). -- Continue atorvastatin -- Given recent strokes in 3 separate vascular territories, stroke team to consider switching to anticoagulation.  -- Obtain old records from recent stroke admission at [facility] regarding stroke work up. May need additional testing to assess risk factors or structural/electrophysiological abnormalities that could predispose to cardioembolic stroke -- PT/OT/Speech  I have interviewed and examined the patient. I  have formulated the assessment and recommendations.  Electronically signed: Dr. Kerney Elbe      --please page stroke NP  Or  PA  Or MD from 8am -4 pm  as this patient from this time will be  followed by the stroke.   You can look them up on [email or web address]  Password [email or web address]

## 2018-06-27 ENCOUNTER — Observation Stay (HOSPITAL_COMMUNITY): Payer: Medicare Other

## 2018-06-27 DIAGNOSIS — Z9221 Personal history of antineoplastic chemotherapy: Secondary | ICD-10-CM | POA: Diagnosis not present

## 2018-06-27 DIAGNOSIS — N183 Chronic kidney disease, stage 3 (moderate): Secondary | ICD-10-CM | POA: Diagnosis present

## 2018-06-27 DIAGNOSIS — I639 Cerebral infarction, unspecified: Secondary | ICD-10-CM | POA: Diagnosis not present

## 2018-06-27 DIAGNOSIS — R29703 NIHSS score 3: Secondary | ICD-10-CM | POA: Diagnosis present

## 2018-06-27 DIAGNOSIS — D62 Acute posthemorrhagic anemia: Secondary | ICD-10-CM | POA: Diagnosis present

## 2018-06-27 DIAGNOSIS — J189 Pneumonia, unspecified organism: Secondary | ICD-10-CM | POA: Diagnosis present

## 2018-06-27 DIAGNOSIS — R471 Dysarthria and anarthria: Secondary | ICD-10-CM | POA: Diagnosis present

## 2018-06-27 DIAGNOSIS — R918 Other nonspecific abnormal finding of lung field: Secondary | ICD-10-CM | POA: Diagnosis not present

## 2018-06-27 DIAGNOSIS — I6350 Cerebral infarction due to unspecified occlusion or stenosis of unspecified cerebral artery: Secondary | ICD-10-CM | POA: Diagnosis not present

## 2018-06-27 DIAGNOSIS — I129 Hypertensive chronic kidney disease with stage 1 through stage 4 chronic kidney disease, or unspecified chronic kidney disease: Secondary | ICD-10-CM | POA: Diagnosis present

## 2018-06-27 DIAGNOSIS — R0602 Shortness of breath: Secondary | ICD-10-CM | POA: Diagnosis not present

## 2018-06-27 DIAGNOSIS — I63432 Cerebral infarction due to embolism of left posterior cerebral artery: Secondary | ICD-10-CM | POA: Diagnosis present

## 2018-06-27 DIAGNOSIS — Z923 Personal history of irradiation: Secondary | ICD-10-CM | POA: Diagnosis not present

## 2018-06-27 DIAGNOSIS — Z8249 Family history of ischemic heart disease and other diseases of the circulatory system: Secondary | ICD-10-CM | POA: Diagnosis not present

## 2018-06-27 DIAGNOSIS — Z8673 Personal history of transient ischemic attack (TIA), and cerebral infarction without residual deficits: Secondary | ICD-10-CM | POA: Diagnosis not present

## 2018-06-27 DIAGNOSIS — Z85118 Personal history of other malignant neoplasm of bronchus and lung: Secondary | ICD-10-CM | POA: Diagnosis not present

## 2018-06-27 DIAGNOSIS — R29810 Facial weakness: Secondary | ICD-10-CM | POA: Diagnosis present

## 2018-06-27 DIAGNOSIS — D61818 Other pancytopenia: Secondary | ICD-10-CM | POA: Diagnosis not present

## 2018-06-27 DIAGNOSIS — I480 Paroxysmal atrial fibrillation: Secondary | ICD-10-CM

## 2018-06-27 DIAGNOSIS — G8194 Hemiplegia, unspecified affecting left nondominant side: Secondary | ICD-10-CM | POA: Diagnosis present

## 2018-06-27 DIAGNOSIS — K5730 Diverticulosis of large intestine without perforation or abscess without bleeding: Secondary | ICD-10-CM | POA: Diagnosis present

## 2018-06-27 DIAGNOSIS — A419 Sepsis, unspecified organism: Secondary | ICD-10-CM | POA: Diagnosis not present

## 2018-06-27 DIAGNOSIS — I4891 Unspecified atrial fibrillation: Secondary | ICD-10-CM | POA: Diagnosis not present

## 2018-06-27 DIAGNOSIS — E46 Unspecified protein-calorie malnutrition: Secondary | ICD-10-CM | POA: Diagnosis present

## 2018-06-27 DIAGNOSIS — D696 Thrombocytopenia, unspecified: Secondary | ICD-10-CM | POA: Diagnosis not present

## 2018-06-27 DIAGNOSIS — N189 Chronic kidney disease, unspecified: Secondary | ICD-10-CM | POA: Diagnosis not present

## 2018-06-27 DIAGNOSIS — E871 Hypo-osmolality and hyponatremia: Secondary | ICD-10-CM | POA: Diagnosis present

## 2018-06-27 DIAGNOSIS — Z7982 Long term (current) use of aspirin: Secondary | ICD-10-CM | POA: Diagnosis not present

## 2018-06-27 DIAGNOSIS — E785 Hyperlipidemia, unspecified: Secondary | ICD-10-CM | POA: Diagnosis not present

## 2018-06-27 DIAGNOSIS — K449 Diaphragmatic hernia without obstruction or gangrene: Secondary | ICD-10-CM | POA: Diagnosis present

## 2018-06-27 DIAGNOSIS — R0682 Tachypnea, not elsewhere classified: Secondary | ICD-10-CM | POA: Diagnosis not present

## 2018-06-27 DIAGNOSIS — I10 Essential (primary) hypertension: Secondary | ICD-10-CM | POA: Diagnosis not present

## 2018-06-27 DIAGNOSIS — D6859 Other primary thrombophilia: Secondary | ICD-10-CM | POA: Diagnosis present

## 2018-06-27 DIAGNOSIS — G459 Transient cerebral ischemic attack, unspecified: Secondary | ICD-10-CM | POA: Diagnosis present

## 2018-06-27 DIAGNOSIS — Z803 Family history of malignant neoplasm of breast: Secondary | ICD-10-CM | POA: Diagnosis not present

## 2018-06-27 DIAGNOSIS — Z87891 Personal history of nicotine dependence: Secondary | ICD-10-CM | POA: Diagnosis not present

## 2018-06-27 LAB — ECHOCARDIOGRAM COMPLETE
Height: 64 in
Weight: 2894.4 oz

## 2018-06-27 LAB — LIPID PANEL
Cholesterol: 106 mg/dL (ref 0–200)
HDL: 26 mg/dL — ABNORMAL LOW (ref >40)
LDL CALC: 58 mg/dL (ref 0–99)
Total CHOL/HDL Ratio: 4.1 RATIO
Triglycerides: 111 mg/dL (ref <150)
VLDL: 22 mg/dL (ref 0–40)

## 2018-06-27 LAB — HEMOGLOBIN A1C
Hgb A1c MFr Bld: 6.5 % — ABNORMAL HIGH (ref 4.8–5.6)
Mean Plasma Glucose: 139.85 mg/dL

## 2018-06-27 MED ORDER — RIVAROXABAN 20 MG PO TABS
20.0000 mg | ORAL_TABLET | Freq: Every day | ORAL | Status: DC
  Administered 2018-06-27 – 2018-06-28 (×2): 20 mg via ORAL
  Filled 2018-06-27 (×2): qty 1

## 2018-06-27 MED ORDER — IOHEXOL 300 MG/ML  SOLN
100.0000 mL | Freq: Once | INTRAMUSCULAR | Status: AC | PRN
  Administered 2018-06-27: 100 mL via INTRAVENOUS

## 2018-06-27 NOTE — Progress Notes (Signed)
PROGRESS NOTE    Michele [name]  JJH:[id number] DOB: 11/03/1945 DOA: 06/26/2018 PCP: Ronita Hipps, MD   Brief Narrative:  Michele [name] is a 73 y.o. female with medical history significant of hypertension, hyperlipidemia, lung cancer status post chemo and radiation who finished chemotherapy on November 2019, thrombocytopenia, recent admission to [facility] for pneumonia and then subsequent stroke.  She stayed in the hospital about 2 days.  She was discharged on 06/22/2018 on aspirin and a statin.  She had improved her right leg weakness and was getting physical therapy at home.  Today morning at about 915 she went to kitchen and was at her usual state of health.  She suddenly felt her left arm is useless, her husband noted facial drooping on the left side and also her voice was slurred.  EMS was called.  By the time EMS arrived, she had no residual symptoms.  Her voice was clear.  Patient did not receive TPA at [facility], she has residual right hemianopsia. Patient stated history of irregular heart rate one time when her blood pressure was high and she had fever.  She does not have otherwise recorded history of A. fib.  At [facility] on discharge, they discussed about starting Eliquis, however her platelet counts were low so they decided to call her for loop recorder placement and she is a scheduled that for next week.  ED Course: Hemodynamically stable in the emergency room.  Renal functions were stable.  Seen by stroke team at the door.  Emergent CTA showed known left frontal and occipital lobe infarction and no new findings.  Her neurological deficits almost completely resolved.  She was not a TPA candidate.   Assessment & Plan:   Principal Problem:   TIA (transient ischemic attack) Active Problems:   Mass of lower lobe of left lung   Essential hypertension   Hyperlipidemia   Pancytopenia ([place])   1. TIA with recent history of a stroke. Admitted to monitored unit because of severity of symptoms.  Neurochecks and vital signs as per stroke protocol. Patient was not a TPA and vascular intervention candidate because of recently stroke and minimal deficits.  Drinking water with husband at the bedside in the ER without complication.    Bedside swallow eval recommends dysphasia with thin liquid diet.  Continue with aspirin, Plavix added by neurology, continue with statin. Blood pressures at goal. consultations, neurology, speech, PT OT MRI of the brain,  showed acute ischemic infarct involving the cortical and subcortical right frontal lobe. Patient was found to have moderate left-sided carotid artery disease, will not repeat. Echocardiogram pending  2.  Lung cancer status post chemo and radiation: Is stabilized now.  3.  Pancytopenia: Her blood counts are improving follow CBCs.  4.  Hyperlipidemia:  Treated with her home statin.  5.  Hypertension:  Per stroke protocol  DVT prophylaxis: Lovenox SQ  Code Status:     Code Status Orders  (From admission, onward)         Start     Ordered   06/26/18 1553  Full code  Continuous     06/26/18 1552        Code Status History    This patient has a current code status but no historical code status.     Family Communication: None present Disposition Plan:   Likely 1 to 2 days Consults called: None Admission status: Observation   Consultants:   Neurology in the ER  Procedures:  Mr Brain Wo  Contrast  Result Date: 06/27/2018 CLINICAL DATA:  Initial event do a shin for left hand numbness and slurred speech, recent stroke. EXAM: MRI HEAD WITHOUT CONTRAST TECHNIQUE: Multiplanar, multiecho pulse sequences of the brain and surrounding structures were obtained without intravenous contrast. COMPARISON:  Prior head CT from 06/26/2018 as well as recent MRI from 06/22/2018. FINDINGS: Examination is limited with only axial coronal DWI and ADC sequences performed. Normal expected interval evolution of recently identified infarcts involving  the right cerebellum, left parieto-occipital region, right occipital region, and high right frontoparietal region, relatively stable in size and distribution as compared to previous MRI from 06/22/2018. However, there is a new area of restricted diffusion involving the cortical and subcortical right frontal lobe, not seen on previous, and consistent with a new ischemic infarct (series 5, image 76). Infarct fairly linear measuring up to approximately 3.5 cm in length. Infarct primarily involves the right frontal operculum. No other evidence for new or interval ischemia. No other definite new acute intracranial abnormality seen on this limited exam. IMPRESSION: 1. 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 2. Otherwise, normal expected interval evolution of additional scattered acute to subacute infarcts involving the bilateral cerebral hemispheres and right cerebellum, relatively stable in size and distribution from previous. Electronically Signed   By: Jeannine Boga M.D.   On: 06/27/2018 04:05   Ct Abdomen Pelvis W Contrast  Result Date: 06/27/2018 CLINICAL DATA:  Inpatient. 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 Restaging. EXAM: CT ABDOMEN AND PELVIS WITH CONTRAST TECHNIQUE: Multidetector CT imaging of the abdomen and pelvis was performed using the standard protocol following bolus administration of intravenous contrast. CONTRAST:  168mL OMNIPAQUE IOHEXOL 300 MG/ML  SOLN COMPARISON:  12/31/2017 PET-CT. FINDINGS: Lower chest: Extensive patchy perilobular consolidation and ground-glass opacity throughout both lung bases, significantly increased from 06/08/2018 chest CT. Coronary atherosclerosis. Trace dependent bilateral pleural effusions. Hepatobiliary: Normal liver size. Simple 1.0 cm inferior right liver  cyst. No additional liver lesions. Normal gallbladder with no radiopaque cholelithiasis. No biliary ductal dilatation. Pancreas: Normal, with no mass or duct dilation. Spleen: Normal size. No mass. Adrenals/Urinary Tract: Normal adrenals. Asymmetric right renal atrophy, unchanged. No hydronephrosis. No renal masses. Normal bladder. Stomach/Bowel: Small hiatal hernia. Otherwise normal nondistended stomach. Normal caliber small bowel with no small bowel wall thickening. Normal appendix. Mild sigmoid diverticulosis, with no large bowel wall thickening or significant pericolonic fat stranding. Vascular/Lymphatic: Atherosclerotic nonaneurysmal abdominal aorta. Patent portal, splenic, hepatic and renal veins. No pathologically enlarged lymph nodes in the abdomen or pelvis. Reproductive: Grossly normal uterus.  No adnexal mass. Other: No pneumoperitoneum, ascites or focal fluid collection. Musculoskeletal: No aggressive appearing focal osseous lesions. Moderate thoracolumbar spondylosis. IMPRESSION: 1. No evidence of metastatic disease in the abdomen or pelvis. 2. Extensive patchy perilobular consolidation and ground-glass opacity throughout both lung bases, significantly increased since recent 06/08/2018 chest CT. The distribution is suggestive of organizing pneumonia such as due to drug toxicity. Multilobar infectious or aspiration pneumonia is a consideration. 3. Trace dependent bilateral pleural effusions. 4. Small hiatal hernia. 5. Mild sigmoid diverticulosis. 6. Aortic Atherosclerosis (ICD10-I70.0). Electronically Signed   By: Ilona Sorrel M.D.   On: 06/27/2018 02:06   Ct Head Code Stroke Wo Contrast  Result Date: 06/26/2018 CLINICAL DATA:  Code stroke.  Left-sided droop and slurred speech EXAM: CT HEAD WITHOUT CONTRAST TECHNIQUE: Contiguous  axial images were obtained from the base of the skull through the vertex without intravenous contrast. COMPARISON:  06/20/2018 FINDINGS: Brain: Known subacute infarct in the  left PCA distribution affecting the occipital lobe. Small early subacute infarcts seen in the posterior right frontal cortex. Small recent right cerebellar infarct. No acute hemorrhage, hydrocephalus, or collection. Vascular: No hyperdense vessel Skull: Negative Sinuses/Orbits: Secretions layer within the maxillary sinuses. There is bilateral ethmoid and maxillary opacification. Partial bilateral mastoid opacification. These findings were seen on prior brain MRI. Other: These results were communicated to Dr. Cheral Marker at 11:06 amon 2/7/2020by text page via the [facility] messaging system. ASPECTS [facility] Stroke Program Early CT Score) Not scored in this clinical setting. IMPRESSION: 1. Known subacute infarcts in the left occipital cortex, right posterior frontal cortex, and right cerebellum. 2. No evidence of interval infarct or hemorrhagic conversion. 3. Active sinusitis. Electronically Signed   By: Monte Fantasia M.D.   On: 06/26/2018 11:08     Antimicrobials:   none    Subjective: Patient reports minimal improvement overnight.  Still with persistent symptoms.  No worsening deficits.  Objective: Vitals:   06/26/18 2018 06/26/18 2314 06/27/18 0354 06/27/18 0814  BP: 117/60 115/70 121/63 115/60  Pulse: 86 90 92 91  Resp: (!) 22 (!) 22 20 20   Temp: 98.5 F (36.9 C) 98.6 F (37 C) 99.1 F (37.3 C) 98.6 F (37 C)  TempSrc: Oral Oral Oral Oral  SpO2: 95% 94% 94% 93%  Weight:      Height:        Intake/Output Summary (Last 24 hours) at 06/27/2018 1042 Last data filed at 06/27/2018 0300 Gross per 24 hour  Intake 303.56 ml  Output -  Net 303.56 ml   Filed Weights   06/26/18 1000  Weight: 82.1 kg    Examination:  General exam: Appears calm and comfortable  Respiratory system: Clear to auscultation. Respiratory effort normal. Cardiovascular system: S1 & S2 heard, RRR. No JVD, murmurs, rubs, gallops or clicks. No pedal edema. Gastrointestinal system: Abdomen is nondistended, soft and  nontender. No organomegaly or masses felt. Normal bowel sounds heard. Central nervous system: Alert and oriented. No focal neurological deficits. Extremities: Symmetric 5 x 5 power. Skin: No rashes, lesions or ulcers Psychiatry: Judgement and insight appear normal. Mood & affect appropriate.     Data Reviewed: I have personally reviewed following labs and imaging studies  CBC: Recent Labs  Lab 06/26/18 1139  WBC 7.5  NEUTROABS 5.8  HGB 10.8*  HCT 34.6*  MCV 97.5  PLT 88*   Basic Metabolic Panel: Recent Labs  Lab 06/26/18 1057 06/26/18 1139  NA  --  140  K  --  4.0  CL  --  105  CO2  --  25  GLUCOSE  --  113*  BUN  --  11  CREATININE 1.10* 1.11*  CALCIUM  --  8.5*   GFR: Estimated Creatinine Clearance: 47.5 mL/min (A) (by C-G formula based on SCr of 1.11 mg/dL (H)). Liver Function Tests: Recent Labs  Lab 06/26/18 1139  AST 16  ALT 12  ALKPHOS 64  BILITOT 0.7  PROT 5.8*  ALBUMIN 2.3*   No results for input(s): LIPASE, AMYLASE in the last 168 hours. No results for input(s): AMMONIA in the last 168 hours. Coagulation Profile: Recent Labs  Lab 06/26/18 1139  INR 1.14   Cardiac Enzymes: No results for input(s): CKTOTAL, CKMB, CKMBINDEX, TROPONINI in the last 168 hours. BNP (last 3 results) No results for input(s):  PROBNP in the last 8760 hours. HbA1C: Recent Labs    06/27/18 0641  HGBA1C 6.5*   CBG: No results for input(s): GLUCAP in the last 168 hours. Lipid Profile: Recent Labs    06/27/18 0641  CHOL 106  HDL 26*  LDLCALC 58  TRIG 111  CHOLHDL 4.1   Thyroid Function Tests: No results for input(s): TSH, T4TOTAL, FREET4, T3FREE, THYROIDAB in the last 72 hours. Anemia Panel: No results for input(s): VITAMINB12, FOLATE, FERRITIN, TIBC, IRON, RETICCTPCT in the last 72 hours. Sepsis Labs: No results for input(s): PROCALCITON, LATICACIDVEN in the last 168 hours.  No results found for this or any previous visit (from the past 240 hour(s)).        Radiology Studies: Mr Brain 47 Contrast  Result Date: 06/27/2018 CLINICAL DATA:  Initial event do a shin for left hand numbness and slurred speech, recent stroke. EXAM: MRI HEAD WITHOUT CONTRAST TECHNIQUE: Multiplanar, multiecho pulse sequences of the brain and surrounding structures were obtained without intravenous contrast. COMPARISON:  Prior head CT from 06/26/2018 as well as recent MRI from 06/22/2018. FINDINGS: Examination is limited with only axial coronal DWI and ADC sequences performed. Normal expected interval evolution of recently identified infarcts involving the right cerebellum, left parieto-occipital region, right occipital region, and high right frontoparietal region, relatively stable in size and distribution as compared to previous MRI from 06/22/2018. However, there is a new area of restricted diffusion involving the cortical and subcortical right frontal lobe, not seen on previous, and consistent with a new ischemic infarct (series 5, image 76). Infarct fairly linear measuring up to approximately 3.5 cm in length. Infarct primarily involves the right frontal operculum. No other evidence for new or interval ischemia. No other definite new acute intracranial abnormality seen on this limited exam. IMPRESSION: 1. 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 2. Otherwise, normal expected interval evolution of additional scattered acute to subacute infarcts involving the bilateral cerebral hemispheres and right cerebellum, relatively stable in size and distribution from previous. Electronically Signed   By: Jeannine Boga M.D.   On: 06/27/2018 04:05   Ct Abdomen Pelvis W Contrast  Result Date: 06/27/2018 CLINICAL DATA:  Inpatient. 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 Restaging.  EXAM: CT ABDOMEN AND PELVIS WITH CONTRAST TECHNIQUE: Multidetector CT imaging of the abdomen and pelvis was performed using the standard protocol following bolus administration of intravenous contrast. CONTRAST:  172mL OMNIPAQUE IOHEXOL 300 MG/ML  SOLN COMPARISON:  12/31/2017 PET-CT. FINDINGS: Lower chest: Extensive patchy perilobular consolidation and ground-glass opacity throughout both lung bases, significantly increased from 06/08/2018 chest CT. Coronary atherosclerosis. Trace dependent bilateral pleural effusions. Hepatobiliary: Normal liver size. Simple 1.0 cm inferior right liver cyst. No additional liver lesions. Normal gallbladder with no radiopaque cholelithiasis. No biliary ductal dilatation. Pancreas: Normal, with no mass or duct dilation. Spleen: Normal size. No mass. Adrenals/Urinary Tract: Normal adrenals. Asymmetric right renal atrophy, unchanged. No hydronephrosis. No renal masses. Normal bladder. Stomach/Bowel: Small hiatal hernia. Otherwise normal nondistended stomach. Normal caliber small bowel with no small bowel wall thickening. Normal appendix. Mild sigmoid diverticulosis, with no large bowel wall thickening or significant pericolonic fat stranding. Vascular/Lymphatic: Atherosclerotic nonaneurysmal abdominal aorta. Patent portal, splenic, hepatic and renal veins. No pathologically enlarged lymph nodes in the abdomen or pelvis. Reproductive: Grossly normal uterus.  No adnexal mass. Other: No pneumoperitoneum,  ascites or focal fluid collection. Musculoskeletal: No aggressive appearing focal osseous lesions. Moderate thoracolumbar spondylosis. IMPRESSION: 1. No evidence of metastatic disease in the abdomen or pelvis. 2. Extensive patchy perilobular consolidation and ground-glass opacity throughout both lung bases, significantly increased since recent 06/08/2018 chest CT. The distribution is suggestive of organizing pneumonia such as due to drug toxicity. Multilobar infectious or aspiration  pneumonia is a consideration. 3. Trace dependent bilateral pleural effusions. 4. Small hiatal hernia. 5. Mild sigmoid diverticulosis. 6. Aortic Atherosclerosis (ICD10-I70.0). Electronically Signed   By: Ilona Sorrel M.D.   On: 06/27/2018 02:06   Ct Head Code Stroke Wo Contrast  Result Date: 06/26/2018 CLINICAL DATA:  Code stroke.  Left-sided droop and slurred speech EXAM: CT HEAD WITHOUT CONTRAST TECHNIQUE: Contiguous axial images were obtained from the base of the skull through the vertex without intravenous contrast. COMPARISON:  06/20/2018 FINDINGS: Brain: Known subacute infarct in the left PCA distribution affecting the occipital lobe. Small early subacute infarcts seen in the posterior right frontal cortex. Small recent right cerebellar infarct. No acute hemorrhage, hydrocephalus, or collection. Vascular: No hyperdense vessel Skull: Negative Sinuses/Orbits: Secretions layer within the maxillary sinuses. There is bilateral ethmoid and maxillary opacification. Partial bilateral mastoid opacification. These findings were seen on prior brain MRI. Other: These results were communicated to Dr. Cheral Marker at 11:06 amon 2/7/2020by text page via the [facility] messaging system. ASPECTS [facility] Stroke Program Early CT Score) Not scored in this clinical setting. IMPRESSION: 1. Known subacute infarcts in the left occipital cortex, right posterior frontal cortex, and right cerebellum. 2. No evidence of interval infarct or hemorrhagic conversion. 3. Active sinusitis. Electronically Signed   By: Monte Fantasia M.D.   On: 06/26/2018 11:08        Scheduled Meds: . amLODipine  5 mg Oral Daily  . aspirin EC  81 mg Oral Daily  . atorvastatin  20 mg Oral Daily  . carvedilol  12.5 mg Oral BID WC  . clopidogrel  75 mg Oral Daily  . enoxaparin (LOVENOX) injection  40 mg Subcutaneous Q24H  . levofloxacin  750 mg Oral Q lunch  . magnesium oxide  400 mg Oral BID  . pantoprazole  40 mg Oral Daily  . sodium chloride flush  3  mL Intravenous Once   Continuous Infusions: . sodium chloride 75 mL/hr at 06/27/18 0410     LOS: 0 days    Time spent: 35 min    Nicolette Bang, MD [facility]  If 7PM-7AM, please contact night-coverage  06/27/2018, 10:42 AM

## 2018-06-27 NOTE — Evaluation (Signed)
Occupational Therapy Evaluation Patient Details Name: Michele [name] MRN: [id number] DOB: Oct 04, 1945 Today's Date: 06/27/2018    History of Present Illness Pt is a 73 y.o. female with medical history significant of hypertension, hyperlipidemia, lung cancer status post chemo and radiation who finished chemotherapy on November 2019, thrombocytopenia, recent admission to [facility] for pneumonia and then subsequent stroke. She was discharged on 06/22/2018. However, morning of 06/26/2018 she experienced left sided numbness, slurred speech, and facial droop and returned to the ED. MRI revealing new acute ischemic infarct involving the cortical and subcortical R frontal lobe at the level of the R frontal operculum.   Clinical Impression   Prior to previous hospitalization pt was independent with ADL and functional mobility and she was working with PT at home since recent discharge. Limited assessment performed today due to transport team arriving to take pt for testing. Pt currently demonstrating decreased BUE strength with L more impacted than R. She demonstrates some decreased functional vision in her right visual fields. At this time, feel that pt would benefit from continued OT services while admitted to improve independence and safety with ADL and functional mobility while admitted. Will update discharge recommendations as further assessment is performed. Feel pt would best benefit from CIR level therapies to maximize return to independence and safety with ADL.     Follow Up Recommendations  Supervision/Assistance - 24 hour;CIR    Equipment Recommendations  Other (comment)(to be determined)    Recommendations for Other Services       Precautions / Restrictions Precautions Precautions: Fall Restrictions Weight Bearing Restrictions: No      Mobility Bed Mobility               General bed mobility comments: Unable to assess  Transfers                 General transfer comment:  Unable to assess    Balance                                           ADL either performed or assessed with clinical judgement   ADL Overall ADL's : Needs assistance/impaired                                       General ADL Comments: Limited assessment today due to transport team arriving to take pt to vascular testing.      Vision Baseline Vision/History: (per chart R hemianopsia) Patient Visual Report: No change from baseline Vision Assessment?: Yes Eye Alignment: Within Functional Limits Ocular Range of Motion: Within Functional Limits Alignment/Gaze Preference: Within Defined Limits Tracking/Visual Pursuits: Decreased smoothness of horizontal tracking;Decreased smoothness of vertical tracking Saccades: Additional eye shifts occurred during testing;Additional head turns occurred during testing Visual Fields: Impaired-to be further tested in functional context Diplopia Assessment: (declined diplopia assessment) Additional Comments: Pt reports no loss of fields. Demonstrating some decreased vision on R with confrontation testing.      Perception     Praxis      Pertinent Vitals/Pain Pain Assessment: No/denies pain     Hand Dominance Right   Extremity/Trunk Assessment Upper Extremity Assessment Upper Extremity Assessment: LUE deficits/detail;RUE deficits/detail RUE Deficits / Details: Generally weak proximally (4/5 strength elbow and hand, 3/5 strength shoulder flexion).  LUE Deficits / Details: Generally weak  3/5 grossly. Sensation in-tact today.            Communication     Cognition Arousal/Alertness: Lethargic Behavior During Therapy: Flat affect Overall Cognitive Status: No family/caregiver present to determine baseline cognitive functioning                                 General Comments: Pt with flat affect and appeared lethargic but appropriately engaging in conversation and following commands.     General Comments  Limited assessment to UE and visual functional assessments due to transport team arriving to take pt to testing.     Exercises     Shoulder Instructions      Home Living Family/patient expects to be discharged to:: Private residence Living Arrangements: Spouse/significant other Available Help at Discharge: Family;Available 24 hours/day Type of Home: House Home Access: Stairs to enter [employer] of Steps: 4 Entrance Stairs-Rails: Right;Left Home Layout: One level     Bathroom Shower/Tub: Tub/shower unit         Home Equipment: Environmental consultant - 2 wheels;Cane - single point;Hand held shower head   Additional Comments: on 3L of O2 at all times since previous admission  Lives With: Spouse    Prior Functioning/Environment Level of Independence: Independent                 OT Problem List: Decreased strength;Decreased range of motion;Decreased activity tolerance;Impaired balance (sitting and/or standing);Decreased safety awareness;Decreased knowledge of use of DME or AE;Impaired vision/perception;Decreased knowledge of precautions;Cardiopulmonary status limiting activity;Pain;Impaired UE functional use      OT Treatment/Interventions: Self-care/ADL training;Therapeutic exercise;Energy conservation;DME and/or AE instruction;Therapeutic activities;Patient/family education;Balance training;Cognitive remediation/compensation;Visual/perceptual remediation/compensation    OT Goals(Current goals can be found in the care plan section) Acute Rehab OT Goals Patient Stated Goal: to feel better OT Goal Formulation: With patient Time For Goal Achievement: 07/11/18 Potential to Achieve Goals: Good ADL Goals Pt Will Perform Grooming: with supervision;standing Pt Will Perform Upper Body Dressing: sitting;with modified independence Pt Will Perform Lower Body Dressing: with modified independence;sit to/from stand Pt Will Transfer to Toilet: with  supervision;ambulating;regular height toilet Pt Will Perform Toileting - Clothing Manipulation and hygiene: with supervision;sit to/from stand  OT Frequency: Min 2X/week   Barriers to D/C:            Co-evaluation              AM-PAC OT "6 Clicks" Daily Activity     Outcome Measure Help from another person eating meals?: A Little Help from another person taking care of personal grooming?: A Little Help from another person toileting, which includes using toliet, bedpan, or urinal?: A Lot Help from another person bathing (including washing, rinsing, drying)?: A Lot Help from another person to put on and taking off regular upper body clothing?: A Lot Help from another person to put on and taking off regular lower body clothing?: A Lot 6 Click Score: 14   End of Session Equipment Utilized During Treatment: Oxygen  Activity Tolerance: Other (comment)(limited by pt needing to go to testing) Patient left: in bed  OT Visit Diagnosis: Other abnormalities of gait and mobility (R26.89);Muscle weakness (generalized) (M62.81);Low vision, both eyes (H54.2)                Time: 1010-1018 OT Time Calculation (min): 8 min Charges:  OT General Charges $OT Visit: 1 Visit OT Evaluation $OT Eval Low Complexity: [place],  OTR/L Acute Rehabilitation Services Office Otho A Cosmo [place] 06/27/2018, 1:35 PM

## 2018-06-27 NOTE — Evaluation (Signed)
Speech Language Pathology Evaluation Patient Details Name: Michele [name] MRN: [id number] DOB: 21-Jul-1945 Today's Date: 06/27/2018 Time: 7517-0017 SLP Time Calculation (min) (ACUTE ONLY): 14 min  Problem List:  Patient Active Problem List   Diagnosis Date Noted  . TIA (transient ischemic attack) 06/26/2018  . Essential hypertension 06/26/2018  . Hyperlipidemia 06/26/2018  . Pancytopenia ([address]) 06/26/2018  . Mediastinal adenopathy 01/05/2018  . Mass of lower lobe of left lung 01/05/2018  . Hilar adenopathy 01/05/2018  . Abnormal PET of left lung 01/05/2018  . Abnormal PET scan of mediastinum 01/05/2018   Past Medical History:  Past Medical History:  Diagnosis Date  . A-fib ([place])   . Acute kidney failure, unspecified ([place])   . Cancer ([place])    lung  . CKD (chronic kidney disease), stage III ([place])   . CVA (cerebral vascular accident) ([place])   . Hyperlipidemia   . Hypertension   . Hyponatremia   . Lung cancer ([place])   . Normocytic anemia   . Stroke ([place])   . Thrombocytopenia, unspecified ([place])   . Vision loss    Past Surgical History:  Past Surgical History:  Procedure Laterality Date  . ENDOBRONCHIAL ULTRASOUND Bilateral 01/08/2018   Procedure: ENDOBRONCHIAL ULTRASOUND;  Surgeon: Garner Nash, DO;  Location: [facility] ENDOSCOPY;  Service: Cardiopulmonary;  Laterality: Bilateral;  . FINE NEEDLE ASPIRATION  01/08/2018   Procedure: FINE NEEDLE ASPIRATION (FNA) LINEAR;  Surgeon: Garner Nash, DO;  Location: [facility] ENDOSCOPY;  Service: Cardiopulmonary;;  . FLEXIBLE BRONCHOSCOPY  01/08/2018   Procedure: FLEXIBLE BRONCHOSCOPY;  Surgeon: Garner Nash, DO;  Location: [facility] ENDOSCOPY;  Service: Cardiopulmonary;;  . TUBAL LIGATION     HPI:  Pt is a 73 yo female admitted with L sided weakness, numbness, and facial droop. MRI showed an acute R frontal CVA. PMH: HTN, HLD, lung cancer s/p chemo and XRT, CVA   Assessment / Plan / Recommendation Clinical Impression  Pt presents with impaired  recall of new information, intellectual awareness, and orientation to time. She reports needing minimal cognitive assistance at home PTA, primarily with management of medications and finances. She has c/o slurred speech, with evidence of a mild dysarthria. Although articulation is mildly imprecise, she is still intelligible at the conversational level. Recommend additional SLP f/u to address cognition and communcation.     SLP Assessment  SLP Recommendation/Assessment: Patient needs continued Speech Lanaguage Pathology Services SLP Visit Diagnosis: Cognitive communication deficit (R41.841);Dysarthria and anarthria (R47.1)    Follow Up Recommendations  (tba)    Frequency and Duration min 2x/week  2 weeks      SLP Evaluation Cognition  Overall Cognitive Status: No family/caregiver present to determine baseline cognitive functioning Arousal/Alertness: Awake/alert Orientation Level: Oriented to person;Oriented to place;Oriented to situation;Disoriented to time Attention: Sustained Sustained Attention: Appears intact Memory: Impaired Memory Impairment: Decreased recall of new information Awareness: Impaired Awareness Impairment: Intellectual impairment       Comprehension  Auditory Comprehension Overall Auditory Comprehension: Appears within functional limits for tasks assessed    Expression Expression Primary Mode of Expression: Verbal Verbal Expression Overall Verbal Expression: Appears within functional limits for tasks assessed   Oral / Motor  Oral Motor/Sensory Function Overall Oral Motor/Sensory Function: Mild impairment Facial ROM: Reduced left;Suspected CN VII (facial) dysfunction Facial Symmetry: Abnormal symmetry left;Suspected CN VII (facial) dysfunction Facial Strength: Reduced left;Suspected CN VII (facial) dysfunction Facial Sensation: Reduced left;Suspected CN V (Trigeminal) dysfunction Lingual ROM: Within Functional Limits Lingual Symmetry: Within Functional  Limits Lingual Strength: Within Functional Limits  Velum: Within Functional Limits Mandible: Within Functional Limits Motor Speech Overall Motor Speech: Impaired Respiration: Within functional limits Phonation: Normal Resonance: Within functional limits Articulation: Impaired Level of Impairment: Conversation Intelligibility: Intelligible   GO                    Venita Sheffield Avalee Castrellon 06/27/2018, 9:38 AM  Germain Osgood Su Duma, M.A. [place] Acute Environmental education officer [phone] Office [phone]

## 2018-06-27 NOTE — Progress Notes (Signed)
STROKE TEAM PROGRESS NOTE   SUBJECTIVE (INTERVAL HISTORY) Her husband is at the bedside.  Pt lying in bed, still has left facial droop and left arm weakness.  As per husband, patient was admitted in [facility] 03/2018 for neutropenic fever after chemotherapy, had A. fib episode lasting 1 hour.  1 week ago, patient admitted for in the hospital for CVA, concerning for A. fib related cardioembolic infarct, put on aspirin instead of anticoagulation due to thrombocytopenia.  During this admission, husband stated that patient had brief A. fib episode in ER before transfer to floor, however that was not documented.   OBJECTIVE Vitals:   06/26/18 2018 06/26/18 2314 06/27/18 0354 06/27/18 0814  BP: 117/60 115/70 121/63 115/60  Pulse: 86 90 92 91  Resp: (!) 22 (!) 22 20 20   Temp: 98.5 F (36.9 C) 98.6 F (37 C) 99.1 F (37.3 C) 98.6 F (37 C)  TempSrc: Oral Oral Oral Oral  SpO2: 95% 94% 94% 93%  Weight:      Height:        CBC:  Recent Labs  Lab 06/26/18 1139  WBC 7.5  NEUTROABS 5.8  HGB 10.8*  HCT 34.6*  MCV 97.5  PLT 88*    Basic Metabolic Panel:  Recent Labs  Lab 06/26/18 1057 06/26/18 1139  NA  --  140  K  --  4.0  CL  --  105  CO2  --  25  GLUCOSE  --  113*  BUN  --  11  CREATININE 1.10* 1.11*  CALCIUM  --  8.5*    Lipid Panel:     Component Value Date/Time   CHOL 106 06/27/2018 0641   TRIG 111 06/27/2018 0641   HDL 26 (L) 06/27/2018 0641   CHOLHDL 4.1 06/27/2018 0641   VLDL 22 06/27/2018 0641   LDLCALC 58 06/27/2018 0641   HgbA1c:  Lab Results  Component Value Date   HGBA1C 6.5 (H) 06/27/2018   Urine Drug Screen: No results found for: LABOPIA, COCAINSCRNUR, LABBENZ, AMPHETMU, THCU, LABBARB  Alcohol Level     Component Value Date/Time   ETH <10 06/26/2018 1139    IMAGING   Mr Brain Wo Contrast 06/27/2018 IMPRESSION:  1. New acute ischemic infarct involving the cortical and subcortical right frontal lobe at the level of the right frontal  operculum, new relative to recent MRI from 06/22/2018.  2. Otherwise, normal expected interval evolution of additional scattered acute to subacute infarcts involving the bilateral cerebral hemispheres and right cerebellum, relatively stable in size and distribution from previous.   Ct Abdomen Pelvis W Contrast 06/27/2018 IMPRESSION:  1. No evidence of metastatic disease in the abdomen or pelvis.  2. Extensive patchy perilobular consolidation and ground-glass opacity throughout both lung bases, significantly increased since recent 06/08/2018 chest CT. The distribution is suggestive of organizing pneumonia such as due to drug toxicity. Multilobar infectious or aspiration pneumonia is a consideration.  3. Trace dependent bilateral pleural effusions.  4. Small hiatal hernia.  5. Mild sigmoid diverticulosis.  6. Aortic Atherosclerosis (ICD10-I70.0).   Ct Head Code Stroke Wo Contrast 06/26/2018 IMPRESSION:  1. Known subacute infarcts in the left occipital cortex, right posterior frontal cortex, and right cerebellum.  2. No evidence of interval infarct or hemorrhagic conversion.  3. Active sinusitis.   Vas [place] Lower Extremity Venous (dvt) 06/27/2018 Summary:  Right: There is no evidence of deep vein thrombosis in the lower extremity.  Left: There is no evidence of deep vein thrombosis in the  lower extremity.   However, portions of this examination were limited- see technologist comments above.  *See table(s) above for measurements and observations.     Transthoracic Echocardiogram   1. The left ventricle has normal systolic function of 92-11%. The cavity size was normal. There is no increased left ventricular wall thickness. Echo evidence of impaired diastolic relaxation Elevated left ventricular end-diastolic pressure.  2. The right ventricle has normal systolic function. The cavity was normal. There is no increase in right ventricular wall thickness. Right ventricular systolic pressure could not be  assessed.  3. Trivial pericardial effusion.  4. The tricuspid valve is normal in structure.  5. The pulmonic valve was normal in structure.  6. The mitral valve is normal in structure.  7. No cardiac source of emboli, but cannot be completely excluded on the basis of this exam. Consider TEE if clinically indicated.   PHYSICAL EXAM  Temp:  [98 F (36.7 C)-99.1 F (37.3 C)] 98.6 F (37 C) (02/08 1221) Pulse Rate:  [83-92] 85 (02/08 1221) Resp:  [20-25] 21 (02/08 1221) BP: (111-122)/(59-72) 111/72 (02/08 1221) SpO2:  [93 %-96 %] 96 % (02/08 1221) FiO2 (%):  [2 %] 2 % (02/08 0814)  General - Well nourished, well developed, in no apparent distress. Sparse of hair due to hx of chemo.  Ophthalmologic - fundi not visualized due to noncooperation.  Cardiovascular - Regular rate and rhythm.  Mental Status -  Level of arousal and orientation to time, place, and person were intact. Language including expression, naming, repetition, comprehension was assessed and found intact.  Cranial Nerves II - XII - II - right hemianopia. III, IV, VI - Extraocular movements intact. V - Facial sensation intact bilaterally. VII - left facial droop. VIII - Hearing & vestibular intact bilaterally. X - Palate elevates symmetrically. XI - Chin turning & shoulder shrug intact bilaterally. XII - Tongue protrusion intact.  Motor Strength - The patient's strength was symmetrical in all extremities except left UE 4/5, pronator drift was present on the left UE and left hand decreased dexterity .  Bulk was normal and fasciculations were absent.   Motor Tone - Muscle tone was assessed at the neck and appendages and was normal.  Reflexes - The patient's reflexes were symmetrical in all extremities and she had no pathological reflexes.  Sensory - Light touch, temperature/pinprick were assessed and were symmetrical.    Coordination - The patient had normal movements in the hands with no ataxia or dysmetria.   Tremor was absent.  Gait and Station - deferred.    ASSESSMENT/PLAN [name] is a 73 y.o. female with history of HTN. HLD, lung cancer ( s/p radiation), atrial fibrillation, CKD, and recent CVA ( 05/2018 @ [facility])  presenting with left sided numbness/weakness, left facial droop, dysarthria, and right hemianopsia. She did not receive IV t-PA due to recent stroke.  Stroke: Acute new right frontal lobe cortical infarct - embolic -highly likely due to paroxysmal atrial fibrillation  Resultant left facial droop, left arm weakness  CT head - Known subacute infarcts in the left occipital cortex, right posterior frontal cortex, and right cerebellum.  MRI head - New acute ischemic infarct involving the cortical and subcortical right frontal lobe at the level of the right frontal operculum,  Lower extremity venous dopplers - no DVT noted  2D Echo EF 60 to 65%  LDL - 58  HgbA1c - 6.5  VTE prophylaxis - Lovenox  aspirin 81 mg daily prior to admission, now  on aspirin 81 mg daily and clopidogrel 75 mg daily.  Given recurrent embolic stroke, recent history of paroxysmal A. fib, discussed with patient and husband, decided to start Xarelto tonight.  Patient counseled to be compliant with her antithrombotic medications  Ongoing aggressive stroke risk factor management  Therapy recommendations:  pending  Disposition:  Pending  History of recent stroke  Do not have access to [facility] record  From [facility], patient had admitted in the hospital on 06/20/2018 with vision loss, CT negative, CTA head and neck showed left PCA branch occlusion, right ICA 70% stenosis.  MRI showed left PCA large infarct, bilateral ACA and right cerebellum pontine infarcts.  Carotid Doppler negative.    As per chart, patient received TPA, and discharged with aspirin 81 and Lipitor 20.  Considered A. fib related stroke but did not put on anticoagulation due to thrombocytopenia.  She was  referred to cardiology on 06/29/2018 for loop recorder.  Probable paroxysmal A. Fib  Husband stated that patient had 1 hour duration A. fib in 03/2018 during admission for neutropenic fever after chemotherapy  Husband also stated that patient had brief A. fib episode in ER at this time, but no documentation noted  Given recurrent cardioembolic strokes, high likelihood of paroxysmal A. fib, discussed with patient and husband, decided on Xarelto, starting tonight  Continue follow-up with Dr. Curt Bears on 06/29/2018 for A. fib management.  Thrombocytopenia  Platelet 88  Discontinue aspirin and Plavix  Okay to start Xarelto tonight  Continue monitoring  Bleeding precautions  Pneumonia with bilateral pleural effusions  Confirmed on CT abdomen and pelvis  management as per primary team  Lung cancer  Status post radiation and chemotherapy  Recent CT chest showed pneumonia, no metastasis  CT abdomen pelvis showed no metastasis  Follow-up with oncology  Hypertension  Stable on the low end  Avoid hypotension . Long-term BP goal normotensive  Hyperlipidemia  Lipid lowering medication PTA:  Lipitor 20 mg daily  LDL 58, goal < 70  Current lipid lowering medication: Lipitor 20 mg daily  Continue statin at discharge  Other Stroke Risk Factors  Advanced age  Former cigarette smoker - quit  Obesity, Body mass index is 31.05 kg/m., recommend weight loss, diet and exercise as appropriate   Other Active Problems  CKD - creatinine - 1.11  Hospital day # 0  Neurology will sign off. Please call with questions. Pt will follow up with stroke clinic NP at [facility] in about 4 weeks. Thanks for the consult.  Rosalin Hawking, MD PhD Stroke Neurology 06/27/2018 6:34 PM  I spent  35 minutes in total face-to-face time with the patient, more than 50% of which was spent in counseling and coordination of care, reviewing test results, images and medication, and discussing the diagnosis of  recurrent cardioembolic infarcts, paroxysmal A. fib, lung cancer with pneumonia, thrombocytopenia, treatment plan and potential prognosis. This patient's care requiresreview of multiple databases, neurological assessment, discussion with family, other specialists and medical decision making of high complexity. I had long discussion with husband and patient at bedside, updated pt current condition, treatment plan and potential prognosis. They expressed understanding and appreciation.    To contact Stroke Continuity provider, please refer to [email or web address] After hours, contact General Neurology

## 2018-06-27 NOTE — Plan of Care (Signed)
A/O x3. Denies any pain or discomfort. Pt and husband was concerned about hydration and nutrition earlier. Provider on call ordered fluid and patient and spouse are satisfied with the decision. Skin warm and dry. Call bell within reach. No acute distress noted at this time. Will continue monitoring.

## 2018-06-27 NOTE — Progress Notes (Signed)
Rehab Admissions Coordinator Note:  Patient was screened by Retta Diones for appropriateness for an Inpatient Acute Rehab Consult.  At this time, we are recommending Inpatient Rehab consult.  Jodell Cipro M 06/27/2018, 3:08 PM  I can be reached at [phone].

## 2018-06-27 NOTE — Evaluation (Signed)
Clinical/Bedside Swallow Evaluation Patient Details  Name: Michele [name] MRN: [id number] Date of Birth: August 15, 1945  Today's Date: 06/27/2018 Time: SLP Start Time (ACUTE ONLY): 0818 SLP Stop Time (ACUTE ONLY): 0830 SLP Time Calculation (min) (ACUTE ONLY): 12 min  Past Medical History:  Past Medical History:  Diagnosis Date  . A-fib ([place])   . Acute kidney failure, unspecified ([place])   . Cancer ([place])    lung  . CKD (chronic kidney disease), stage III ([place])   . CVA (cerebral vascular accident) ([place])   . Hyperlipidemia   . Hypertension   . Hyponatremia   . Lung cancer ([place])   . Normocytic anemia   . Stroke ([place])   . Thrombocytopenia, unspecified ([place])   . Vision loss    Past Surgical History:  Past Surgical History:  Procedure Laterality Date  . ENDOBRONCHIAL ULTRASOUND Bilateral 01/08/2018   Procedure: ENDOBRONCHIAL ULTRASOUND;  Surgeon: Garner Nash, DO;  Location: [facility];  Service: Cardiopulmonary;  Laterality: Bilateral;  . FINE NEEDLE ASPIRATION  01/08/2018   Procedure: FINE NEEDLE ASPIRATION (FNA) LINEAR;  Surgeon: Garner Nash, DO;  Location: [facility];  Service: Cardiopulmonary;;  . FLEXIBLE BRONCHOSCOPY  01/08/2018   Procedure: FLEXIBLE BRONCHOSCOPY;  Surgeon: Garner Nash, DO;  Location: [facility];  Service: Cardiopulmonary;;  . TUBAL LIGATION     HPI:  Pt is a 73 yo female admitted with L sided weakness, numbness, and facial droop. MRI showed an acute R frontal CVA. PMH: HTN, HLD, lung cancer s/p chemo and XRT, CVA   Assessment / Plan / Recommendation Clinical Impression  Pt has signs of dysphagia that include mild left-side sensorimotor changes resulting in small amounts of anterior spillage. Pt has inconsistent attempts at self-correcting. Min cues were provided for oral holding. Pt has a baseline cough with only one cough noted during PO intake after a bite of puree. It was wet sounding and productive of minimal amounts of puree. No further signs  concerning for aspiration were observed. Given signs of dysphagia, pt may likely benefit from [facility]. Pending ability to complete testing, would allow Dys 1 diet and thin liquids with full supervision, taking small, alternating bites and sips. If any further overt signs of difficulty are observed, would hold POs until further testing is completed. SLP Visit Diagnosis: Dysphagia, unspecified (R13.10)    Aspiration Risk  Mild aspiration risk;Moderate aspiration risk    Diet Recommendation Dysphagia 1 (Puree);Thin liquid   Liquid Administration via: Straw Medication Administration: Crushed with puree Supervision: Patient able to self feed;Full supervision/cueing for compensatory strategies Compensations: Slow rate;Small sips/bites;Follow solids with liquid;Monitor for anterior loss Postural Changes: Seated upright at 90 degrees;Remain upright for at least 30 minutes after po intake    Other  Recommendations Oral Care Recommendations: Oral care BID   Follow up Recommendations (tba)      Frequency and Duration min 2x/week  2 weeks       Prognosis Prognosis for Safe Diet Advancement: Good      Swallow Study   General HPI: Pt is a 73 yo female admitted with L sided weakness, numbness, and facial droop. MRI showed an acute R frontal CVA. PMH: HTN, HLD, lung cancer s/p chemo and XRT, CVA Type of Study: Bedside Swallow Evaluation Previous Swallow Assessment: none in chart Diet Prior to this Study: NPO Temperature Spikes Noted: No Respiratory Status: Room air History of Recent Intubation: No Behavior/Cognition: Alert;Cooperative Oral Cavity Assessment: Within Functional Limits Oral Care Completed by SLP: No Oral Cavity -  Dentition: Adequate natural dentition Vision: Functional for self-feeding Self-Feeding Abilities: Able to feed self Patient Positioning: Upright in bed Baseline Vocal Quality: Normal Volitional Cough: Strong;Congested Volitional Swallow: Able to elicit     Oral/Motor/Sensory Function Overall Oral Motor/Sensory Function: Mild impairment Facial ROM: Reduced left;Suspected CN VII (facial) dysfunction Facial Symmetry: Abnormal symmetry left;Suspected CN VII (facial) dysfunction Facial Strength: Reduced left;Suspected CN VII (facial) dysfunction Facial Sensation: Reduced left;Suspected CN V (Trigeminal) dysfunction Lingual ROM: Within Functional Limits Lingual Symmetry: Within Functional Limits Lingual Strength: Within Functional Limits Velum: Within Functional Limits Mandible: Within Functional Limits   Ice Chips Ice chips: Not tested   Thin Liquid Thin Liquid: Impaired Presentation: Cup;Self Fed;Straw Oral Phase Impairments: Reduced labial seal Oral Phase Functional Implications: Oral holding;Left anterior spillage    Nectar Thick Nectar Thick Liquid: Not tested   Honey Thick Honey Thick Liquid: Not tested   Puree Puree: Impaired Presentation: Self Fed;Spoon Oral Phase Impairments: Reduced labial seal Oral Phase Functional Implications: Left anterior spillage Pharyngeal Phase Impairments: Cough - Immediate   Solid     Solid: Not tested      Michele [name] 06/27/2018,9:25 AM  Michele [name], M.A. [place] Acute Environmental education officer [phone] Office [phone]

## 2018-06-27 NOTE — Progress Notes (Signed)
VASCULAR LAB PRELIMINARY  PRELIMINARY  PRELIMINARY  PRELIMINARY  Bilateral lower extremity venous duplex completed.    Preliminary report:  See CV Proc  [place] Espina, RVT 06/27/2018, 10:53 AM

## 2018-06-27 NOTE — Progress Notes (Signed)
ANTICOAGULATION CONSULT NOTE - Initial Consult  Pharmacy Consult for apixaban Indication: atrial fibrillation  No Known Allergies  Patient Measurements: Height: 5\' 4"  (162.6 cm) Weight: 180 lb 14.4 oz (82.1 kg) IBW/kg (Calculated) : 54.7   Vital Signs: Temp: 98.6 F (37 C) (02/08 1221) Temp Source: Oral (02/08 1221) BP: 111/72 (02/08 1221) Pulse Rate: 85 (02/08 1221)  Labs: Recent Labs    06/26/18 1057 06/26/18 1139  HGB  --  10.8*  HCT  --  34.6*  PLT  --  88*  APTT  --  31  LABPROT  --  14.5  INR  --  1.14  CREATININE 1.10* 1.11*    Estimated Creatinine Clearance: 47.5 mL/min (A) (by C-G formula based on SCr of 1.11 mg/dL (H)).   Medical History: Past Medical History:  Diagnosis Date  . A-fib ([place])   . Acute kidney failure, unspecified ([place])   . Cancer ([place])    lung  . CKD (chronic kidney disease), stage III ([place])   . CVA (cerebral vascular accident) ([place])   . Hyperlipidemia   . Hypertension   . Hyponatremia   . Lung cancer ([place])   . Normocytic anemia   . Stroke ([place])   . Thrombocytopenia, unspecified ([place])   . Vision loss     Medications:  Medications Prior to Admission  Medication Sig Dispense Refill Last Dose  . acetaminophen (TYLENOL) 325 MG tablet Take 650 mg by mouth every 6 (six) hours as needed for fever.   unk  . amLODipine (NORVASC) 5 MG tablet Take 5 mg by mouth daily.   06/25/2018 at Unknown time  . aspirin EC 81 MG tablet Take 81 mg by mouth daily.   06/25/2018 at Unknown time  . atorvastatin (LIPITOR) 20 MG tablet Take 20 mg by mouth daily.  3 06/25/2018 at Unknown time  . carvedilol (COREG) 12.5 MG tablet Take 12.5 mg by mouth 2 (two) times daily with a meal.   06/25/2018 at 1900  . ibuprofen (ADVIL,MOTRIN) 200 MG tablet Take 400 mg by mouth every 6 (six) hours as needed for headache or moderate pain.   Past Week at Unknown time  . levofloxacin (LEVAQUIN) 750 MG tablet Take 750 mg by mouth daily.   06/25/2018 at Unknown time  . Magnesium Oxide  400 (240 Mg) MG TABS Take 400 mg by mouth 2 (two) times daily.   06/25/2018 at Unknown time  . omeprazole (PRILOSEC) 20 MG capsule Take 20 mg by mouth daily.   06/25/2018 at Unknown time   Scheduled:  . amLODipine  5 mg Oral Daily  . atorvastatin  20 mg Oral Daily  . carvedilol  12.5 mg Oral BID WC  . levofloxacin  750 mg Oral Q lunch  . magnesium oxide  400 mg Oral BID  . pantoprazole  40 mg Oral Daily  . sodium chloride flush  3 mL Intravenous Once    Assessment: 73 yo female with CVA likely due to afib. Pharmacy consulted to begin xarelto.  -hg= 10.8, plt= 88 (Lung cancer status post chemo and radiation) -SCr= 1.11, CrCl ~ 60 (using TBW)  Goal of Therapy:  Monitor platelets by anticoagulation protocol: Yes   Plan:  -Xarelto 20mg  po daily -CBC in am for trend  Hildred Laser, PharmD Clinical Pharmacist **Pharmacist phone directory can now be found on [email or web address] ([email or web address]).  Listed under [place].

## 2018-06-27 NOTE — Progress Notes (Signed)
Pt has gone to radiology for MRI of the  Brain

## 2018-06-27 NOTE — Progress Notes (Signed)
Physical Therapy Evaluation Patient Details Name: Michele [name] MRN: [id number] DOB: [birth year] Today's Date: 06/27/2018   History of Present Illness  Pt is a 73 y.o. female with medical history significant of hypertension, hyperlipidemia, lung cancer status post chemo and radiation who finished chemotherapy on November 2019, thrombocytopenia, recent admission to [facility] for pneumonia and then subsequent stroke. She was discharged on 06/22/2018. However, morning of 06/26/2018 she experienced left sided numbness, slurred speech, and facial droop and returned to the ED. MRI revealing new acute ischemic infarct involving the cortical and subcortical R frontal lobe at the level of the R frontal operculum.    Clinical Impression  Pt presented supine in bed with HOB elevated, awake and willing to participate in therapy session. Prior to recent admission at [facility] on 06/22/28, pt was independent with all functional mobility and ADLs. Since returning home from that admission, pt has required use of a RW and some assistance with ADLs from husband. Pt also requires 3L of O2 at all times. Pt very limited secondary to weakness and fatigue during evaluation. She is able to perform bed mobility with min guard and transfers with min A for stability and safety. Pt on 4L of O2 with activity with SPO2 maintaining >90% throughout. All other VSS throughout. Pt would benefit from further therapy services at [facility] to maximize her independence with functional mobility prior to returning home with family support. PT will continue to follow acutely to progress mobility as tolerated.     Follow Up Recommendations [facility]    Equipment Recommendations  None recommended by PT    Recommendations for Other Services Rehab consult     Precautions / Restrictions Precautions Precautions: Fall Precaution Comments: on 3L of O2 at baseline Restrictions Weight Bearing Restrictions: No      Mobility  Bed Mobility Overal bed mobility:  Needs Assistance Bed Mobility: Supine to Sit     Supine to sit: Min guard     General bed mobility comments: increased time and effort, HOB elevated, pt achieving upright sitting at EOB towards her L side  Transfers Overall transfer level: Needs assistance Equipment used: 1 person hand held assist Transfers: Sit to/from [facility] Sit to Stand: Min assist Stand pivot transfers: Min assist       General transfer comment: increased effort and time needed, pt very cautious and slowly pivoting towards recliner chair towards her R side with use of armrests for stability throughout  Ambulation/Gait             General Gait Details: unable to tolerate during evaluation  Stairs            Wheelchair Mobility    Modified Rankin (Stroke Patients Only) Modified Rankin (Stroke Patients Only) Pre-Morbid Rankin Score: Moderate disability Modified Rankin: Moderately severe disability     Balance Overall balance assessment: Needs assistance Sitting-balance support: Feet supported Sitting balance-Leahy Scale: Fair     Standing balance support: During functional activity;Single extremity supported;Bilateral upper extremity supported Standing balance-Leahy Scale: Poor                               Pertinent Vitals/Pain Pain Assessment: No/denies pain    Home Living Family/patient expects to be discharged to:: Private residence Living Arrangements: Spouse/significant other Available Help at Discharge: Family;Available 24 hours/day Type of Home: House Home Access: Stairs to enter Entrance Stairs-Rails: Psychiatric nurse of Steps: 4 Home Layout: One level Home Equipment:  Walker - 2 wheels;Cane - single point;Hand held shower head;Bedside commode;Wheelchair - manual(pt's sister in law has a BSC and w/c) Additional Comments: on 3L of O2 at all times since previous admission    Prior Function Level of Independence: Independent                Hand Dominance   Dominant Hand: Right    Extremity/Trunk Assessment   Upper Extremity Assessment Upper Extremity Assessment: Defer to OT evaluation RUE Deficits / Details: Generally weak proximally (4/5 strength elbow and hand, 3/5 strength shoulder flexion).  LUE Deficits / Details: Generally weak 3/5 grossly. Sensation in-tact today.     Lower Extremity Assessment Lower Extremity Assessment: Generalized weakness       Communication   Communication: No difficulties  Cognition Arousal/Alertness: Awake/alert Behavior During Therapy: Flat affect Overall Cognitive Status: No family/caregiver present to determine baseline cognitive functioning Area of Impairment: Safety/judgement;Problem solving                         Safety/Judgement: Decreased awareness of safety   Problem Solving: Slow processing;Decreased initiation;Requires verbal cues General Comments: Pt with flat affect and appeared lethargic but appropriately engaging in conversation and following commands.       General Comments General comments (skin integrity, edema, etc.): Limited assessment to UE and visual functional assessments due to transport team arriving to take pt to testing.     Exercises     Assessment/Plan    PT Assessment Patient needs continued PT services  PT Problem List Decreased strength;Decreased activity tolerance;Decreased balance;Decreased mobility;Decreased coordination;Decreased knowledge of use of DME;Decreased safety awareness;Decreased knowledge of precautions;Cardiopulmonary status limiting activity       PT Treatment Interventions DME instruction;Gait training;Stair training;Functional mobility training;Therapeutic exercise;Therapeutic activities;Balance training;Neuromuscular re-education;Patient/family education    PT Goals (Current goals can be found in the Care Plan section)  Acute Rehab PT Goals Patient Stated Goal: to feel better PT Goal  Formulation: With patient/family Time For Goal Achievement: 07/11/18 Potential to Achieve Goals: Good    Frequency Min 4X/week   Barriers to discharge        Co-evaluation               AM-PAC PT "6 Clicks" Mobility  Outcome Measure Help needed turning from your back to your side while in a flat bed without using bedrails?: None Help needed moving from lying on your back to sitting on the side of a flat bed without using bedrails?: None Help needed moving to and from a bed to a chair (including a wheelchair)?: A Little Help needed standing up from a chair using your arms (e.g., wheelchair or bedside chair)?: A Little Help needed to walk in hospital room?: A Lot Help needed climbing 3-5 steps with a railing? : Total 6 Click Score: 17    End of Session Equipment Utilized During Treatment: Gait belt;Oxygen(3-4 L of O2) Activity Tolerance: Patient limited by fatigue Patient left: in chair;with call bell/phone within reach;with family/visitor present Nurse Communication: Mobility status PT Visit Diagnosis: Other abnormalities of gait and mobility (R26.89);Other symptoms and signs involving the nervous system (R29.898)    Time: 5465-6812 PT Time Calculation (min) (ACUTE ONLY): 39 min   Charges:   PT Evaluation $PT Eval Moderate Complexity: 1 Mod PT Treatments $Therapeutic Activity: 23-37 mins        Sherie Don, PT, DPT  Acute Rehabilitation Services Pager [phone] Office [place] 06/27/2018, 2:12 PM

## 2018-06-27 NOTE — Progress Notes (Signed)
  Echocardiogram 2D Echocardiogram has been performed.  Jennette Dubin 06/27/2018, 11:09 AM

## 2018-06-28 DIAGNOSIS — I4891 Unspecified atrial fibrillation: Secondary | ICD-10-CM

## 2018-06-28 DIAGNOSIS — R0682 Tachypnea, not elsewhere classified: Secondary | ICD-10-CM

## 2018-06-28 DIAGNOSIS — Z8673 Personal history of transient ischemic attack (TIA), and cerebral infarction without residual deficits: Secondary | ICD-10-CM

## 2018-06-28 DIAGNOSIS — D62 Acute posthemorrhagic anemia: Secondary | ICD-10-CM

## 2018-06-28 DIAGNOSIS — D696 Thrombocytopenia, unspecified: Secondary | ICD-10-CM

## 2018-06-28 DIAGNOSIS — E46 Unspecified protein-calorie malnutrition: Secondary | ICD-10-CM

## 2018-06-28 DIAGNOSIS — N189 Chronic kidney disease, unspecified: Secondary | ICD-10-CM

## 2018-06-28 DIAGNOSIS — E8809 Other disorders of plasma-protein metabolism, not elsewhere classified: Secondary | ICD-10-CM

## 2018-06-28 DIAGNOSIS — E871 Hypo-osmolality and hyponatremia: Secondary | ICD-10-CM

## 2018-06-28 DIAGNOSIS — I639 Cerebral infarction, unspecified: Secondary | ICD-10-CM

## 2018-06-28 LAB — CBC WITH DIFFERENTIAL/PLATELET
Abs Immature Granulocytes: 0.05 10*3/uL (ref 0.00–0.07)
BASOS PCT: 0 %
Basophils Absolute: 0 10*3/uL (ref 0.0–0.1)
EOS ABS: 0.2 10*3/uL (ref 0.0–0.5)
Eosinophils Relative: 4 %
HCT: 29.5 % — ABNORMAL LOW (ref 36.0–46.0)
Hemoglobin: 9.3 g/dL — ABNORMAL LOW (ref 12.0–15.0)
Immature Granulocytes: 1 %
LYMPHS ABS: 0.5 10*3/uL — AB (ref 0.7–4.0)
Lymphocytes Relative: 10 %
MCH: 30.1 pg (ref 26.0–34.0)
MCHC: 31.5 g/dL (ref 30.0–36.0)
MCV: 95.5 fL (ref 80.0–100.0)
Monocytes Absolute: 0.7 10*3/uL (ref 0.1–1.0)
Monocytes Relative: 12 %
NRBC: 0 % (ref 0.0–0.2)
Neutro Abs: 4.1 10*3/uL (ref 1.7–7.7)
Neutrophils Relative %: 73 %
Platelets: 96 10*3/uL — ABNORMAL LOW (ref 150–400)
RBC: 3.09 MIL/uL — AB (ref 3.87–5.11)
RDW: 15.4 % (ref 11.5–15.5)
WBC: 5.6 10*3/uL (ref 4.0–10.5)

## 2018-06-28 NOTE — Progress Notes (Addendum)
PROGRESS NOTE    Michele [name]  TZG:[id number] DOB: 1945-06-28 DOA: 06/26/2018 PCP: Ronita Hipps, MD   Brief Narrative:   Michele [name] is a 73 y.o. female with medical history significant of hypertension, hyperlipidemia, lung cancer status post chemo and radiation who finished chemotherapy on November 2019, thrombocytopenia, recent admission to [facility] for pneumonia and then subsequent stroke.  She stayed in the hospital about 2 days.  She was discharged on 06/22/2018 on aspirin and a statin.  She had improved her right leg weakness and was getting physical therapy at home.  Today morning at about 915 she went to kitchen and was at her usual state of health.  She suddenly felt her left arm is useless, her husband noted facial drooping on the left side and also her voice was slurred.  EMS was called.  By the time EMS arrived, she had no residual symptoms.  Her voice was clear.  Patient did not receive TPA at [facility], she has residual right hemianopsia. Patient stated history of irregular heart rate one time when her blood pressure was high and she had fever.  She does not have otherwise recorded history of A. fib.  At [facility] on discharge, they discussed about starting Eliquis, however her platelet counts were low so they decided to call her for loop recorder placement and she is a scheduled that for next week. ED Course: Hemodynamically stable in the emergency room.  Renal functions were stable.  Seen by stroke team at the door.  Emergent CTA showed known left frontal and occipital lobe infarction and no new findings.  Her neurological deficits almost completely resolved.  She was not a TPA candidate.   Assessment & Plan:   Principal Problem:   TIA (transient ischemic attack) Active Problems:   Mass of lower lobe of left lung   Essential hypertension   Hyperlipidemia   Pancytopenia (HCC)   Acute CVA (cerebrovascular accident) ([place])   1. Acute stroke. AdmitTted to monitored unit because of  severity of symptoms. Neurochecks and vital signs as per stroke protocol. As previously noted, patient was not a TPA and vascular intervention candidate because of recently stroke and minimal deficits.  Drinking water with husband at the bedside.    Was seen by speech with recommendations for dysphasia diet with thin liquids, continue with aspirin and a statin. Blood pressures per stroke protocol MRI of brain was positive for acute CVA. Consultations:  neurology Rex appreciated,, speech with recommendations as above, rehab recommended by PT-consultation placed,  OT with recommendations for rehab versus 24-hour supervision. Patient was found to have moderate left-sided carotid artery disease, will not repeat. Echocardiogram pending Anticoagulation Xarelto per neurology  2.  Lung cancer status post chemo and radiation: Is stabilized now.  Patient will follow-up with her outpatient provider  3.  Pancytopenia: Her blood counts are improving, most recent white count 5.6, hemoglobin 9.3, platelets 96  4.  Hyperlipidemia:  Continue with statin, total cholesterol 106 HDL 26 LDL 58 triglycerides 111  5.  Hypertension:  BP meds per stroke protocol  6. Possible PNA: Noted on CT of abdomen pelvis with consolidation.  Patient was placed on levofloxacin which will continue for 5 doses, unclear this is true pneumonia versus scarring from history of lung cancer with chemo and radiation.  Patient is afebrile white count is normal and she is normotensive.  DVT prophylaxis: PR:FFMBWGY   Code Status: Full code    Code Status Orders  (From admission, onward)  Start     Ordered   06/26/18 1553  Full code  Continuous     06/26/18 1552        Code Status History    This patient has a current code status but no historical code status.     Family Communication: None present Disposition Plan:   Rehab Consults called: Neurology Admission status: Inpatient   Consultants:    neuro  Procedures:  Mr Brain Wo Contrast  Result Date: 06/27/2018 CLINICAL DATA:  Initial event do a shin for left hand numbness and slurred speech, recent stroke. EXAM: MRI HEAD WITHOUT CONTRAST TECHNIQUE: Multiplanar, multiecho pulse sequences of the brain and surrounding structures were obtained without intravenous contrast. COMPARISON:  Prior head CT from 06/26/2018 as well as recent MRI from 06/22/2018. FINDINGS: Examination is limited with only axial coronal DWI and ADC sequences performed. Normal expected interval evolution of recently identified infarcts involving the right cerebellum, left parieto-occipital region, right occipital region, and high right frontoparietal region, relatively stable in size and distribution as compared to previous MRI from 06/22/2018. However, there is a new area of restricted diffusion involving the cortical and subcortical right frontal lobe, not seen on previous, and consistent with a new ischemic infarct (series 5, image 76). Infarct fairly linear measuring up to approximately 3.5 cm in length. Infarct primarily involves the right frontal operculum. No other evidence for new or interval ischemia. No other definite new acute intracranial abnormality seen on this limited exam. IMPRESSION: 1. 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 2. Otherwise, normal expected interval evolution of additional scattered acute to subacute infarcts involving the bilateral cerebral hemispheres and right cerebellum, relatively stable in size and distribution from previous. Electronically Signed   By: Jeannine Boga M.D.   On: 06/27/2018 04:05   Ct Abdomen Pelvis W Contrast  Result Date: 06/27/2018 CLINICAL DATA:  Inpatient. 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 Restaging. EXAM: CT  ABDOMEN AND PELVIS WITH CONTRAST TECHNIQUE: Multidetector CT imaging of the abdomen and pelvis was performed using the standard protocol following bolus administration of intravenous contrast. CONTRAST:  164mL OMNIPAQUE IOHEXOL 300 MG/ML  SOLN COMPARISON:  12/31/2017 PET-CT. FINDINGS: Lower chest: Extensive patchy perilobular consolidation and ground-glass opacity throughout both lung bases, significantly increased from 06/08/2018 chest CT. Coronary atherosclerosis. Trace dependent bilateral pleural effusions. Hepatobiliary: Normal liver size. Simple 1.0 cm inferior right liver cyst. No additional liver lesions. Normal gallbladder with no radiopaque cholelithiasis. No biliary ductal dilatation. Pancreas: Normal, with no mass or duct dilation. Spleen: Normal size. No mass. Adrenals/Urinary Tract: Normal adrenals. Asymmetric right renal atrophy, unchanged. No hydronephrosis. No renal masses. Normal bladder. Stomach/Bowel: Small hiatal hernia. Otherwise normal nondistended stomach. Normal caliber small bowel with no small bowel wall thickening. Normal appendix. Mild sigmoid diverticulosis, with no large bowel wall thickening or significant pericolonic fat stranding. Vascular/Lymphatic: Atherosclerotic nonaneurysmal abdominal aorta. Patent portal, splenic, hepatic and renal veins. No pathologically enlarged lymph nodes in the abdomen or pelvis. Reproductive: Grossly normal uterus.  No adnexal mass. Other: No pneumoperitoneum, ascites or focal fluid collection. Musculoskeletal: No aggressive appearing focal osseous lesions. Moderate thoracolumbar spondylosis. IMPRESSION: 1. No evidence of metastatic disease in the abdomen or pelvis. 2. Extensive patchy perilobular consolidation and ground-glass opacity throughout both lung bases, significantly increased since recent 06/08/2018 chest CT. The distribution is suggestive of organizing  pneumonia such as due to drug toxicity. Multilobar infectious or aspiration pneumonia is a  consideration. 3. Trace dependent bilateral pleural effusions. 4. Small hiatal hernia. 5. Mild sigmoid diverticulosis. 6. Aortic Atherosclerosis (ICD10-I70.0). Electronically Signed   By: Ilona Sorrel M.D.   On: 06/27/2018 02:06   Ct Head Code Stroke Wo Contrast  Result Date: 06/26/2018 CLINICAL DATA:  Code stroke.  Left-sided droop and slurred speech EXAM: CT HEAD WITHOUT CONTRAST TECHNIQUE: Contiguous axial images were obtained from the base of the skull through the vertex without intravenous contrast. COMPARISON:  06/20/2018 FINDINGS: Brain: Known subacute infarct in the left PCA distribution affecting the occipital lobe. Small early subacute infarcts seen in the posterior right frontal cortex. Small recent right cerebellar infarct. No acute hemorrhage, hydrocephalus, or collection. Vascular: No hyperdense vessel Skull: Negative Sinuses/Orbits: Secretions layer within the maxillary sinuses. There is bilateral ethmoid and maxillary opacification. Partial bilateral mastoid opacification. These findings were seen on prior brain MRI. Other: These results were communicated to Dr. Cheral Marker at 11:06 amon 2/7/2020by text page via the [facility] messaging system. ASPECTS [facility] Stroke Program Early CT Score) Not scored in this clinical setting. IMPRESSION: 1. Known subacute infarcts in the left occipital cortex, right posterior frontal cortex, and right cerebellum. 2. No evidence of interval infarct or hemorrhagic conversion. 3. Active sinusitis. Electronically Signed   By: Monte Fantasia M.D.   On: 06/26/2018 11:08   Vas [place] Lower Extremity Venous (dvt)  Result Date: 06/27/2018  Lower Venous Study Indications: Stroke.  Limitations: Patient unable to fully turn knees out. Comparison Study: No prior study on file Performing Technologist: Sharion Dove RVS  Examination Guidelines: A complete evaluation includes B-mode imaging, spectral Doppler, color Doppler, and power Doppler as needed of all accessible portions of each  vessel. Bilateral testing is considered an integral part of a complete examination. Limited examinations for reoccurring indications may be performed as noted.  Right Venous Findings: +---------+---------------+---------+-----------+----------+-------+          CompressibilityPhasicitySpontaneityPropertiesSummary +---------+---------------+---------+-----------+----------+-------+ CFV      Full           Yes      Yes                          +---------+---------------+---------+-----------+----------+-------+ SFJ      Full                                                 +---------+---------------+---------+-----------+----------+-------+ FV Prox  Full                                                 +---------+---------------+---------+-----------+----------+-------+ FV Mid   Full                                                 +---------+---------------+---------+-----------+----------+-------+ FV DistalFull                                                 +---------+---------------+---------+-----------+----------+-------+  PFV      Full                                                 +---------+---------------+---------+-----------+----------+-------+ POP      Full           Yes      Yes                          +---------+---------------+---------+-----------+----------+-------+ PTV      Full                                                 +---------+---------------+---------+-----------+----------+-------+ PERO     Full                                                 +---------+---------------+---------+-----------+----------+-------+  Left Venous Findings: +---------+---------------+---------+-----------+----------+----------+          CompressibilityPhasicitySpontaneityPropertiesSummary    +---------+---------------+---------+-----------+----------+----------+ CFV      Full                                                     +---------+---------------+---------+-----------+----------+----------+ SFJ      Full                                                    +---------+---------------+---------+-----------+----------+----------+ FV Prox  Full                                                    +---------+---------------+---------+-----------+----------+----------+ FV Mid   Full                                                    +---------+---------------+---------+-----------+----------+----------+ FV DistalFull                                                    +---------+---------------+---------+-----------+----------+----------+ PFV      Full                                                    +---------+---------------+---------+-----------+----------+----------+ POP  Yes      Yes                  visualized +---------+---------------+---------+-----------+----------+----------+ PTV      Full                                                    +---------+---------------+---------+-----------+----------+----------+ PERO     Full                                                    +---------+---------------+---------+-----------+----------+----------+    Summary: Right: There is no evidence of deep vein thrombosis in the lower extremity. Left: There is no evidence of deep vein thrombosis in the lower extremity. However, portions of this examination were limited- see technologist comments above.  *See table(s) above for measurements and observations. Electronically signed by Monica Martinez MD on 06/27/2018 at 5:50:20 PM.    Final      Antimicrobials:   Levofloxacin day #2   Subjective: No acute change in patient's condition.  Reports still feeling tired but is up and out of bed to bedside chair today.  Worked with physical therapy with recommendations for rehab which was discussed with patient and she is in agreement with.  Objective: Vitals:    06/27/18 1908 06/27/18 2322 06/28/18 0310 06/28/18 0826  BP: (!) 111/59 130/64 115/61 124/74  Pulse: 87 84  88  Resp: (!) 22   18  Temp: 98.8 F (37.1 C) 98.6 F (37 C) 98.2 F (36.8 C) 98 F (36.7 C)  TempSrc: Axillary Oral Axillary Oral  SpO2: 94%     Weight:      Height:        Intake/Output Summary (Last 24 hours) at 06/28/2018 1131 Last data filed at 06/28/2018 0900 Gross per 24 hour  Intake 1747.28 ml  Output -  Net 1747.28 ml   Filed Weights   06/26/18 1000  Weight: 82.1 kg    Examination:  General exam: Appears calm and comfortable  Respiratory system: Clear to auscultation. Respiratory effort normal. Cardiovascular system: S1 & S2 heard, RRR. No JVD, murmurs, rubs, gallops or clicks. No pedal edema. Gastrointestinal system: Abdomen is nondistended, soft and nontender. No organomegaly or masses felt. Normal bowel sounds heard. Central nervous system: Alert and oriented.  Right hemianopsia Extremities: Symmetric 5 x 5 power. Skin: No rashes, lesions or ulcers Psychiatry: Judgement and insight appear normal. Mood & affect appropriate.     Data Reviewed: I have personally reviewed following labs and imaging studies  CBC: Recent Labs  Lab 06/26/18 1139 06/28/18 0635  WBC 7.5 5.6  NEUTROABS 5.8 4.1  HGB 10.8* 9.3*  HCT 34.6* 29.5*  MCV 97.5 95.5  PLT 88* 96*   Basic Metabolic Panel: Recent Labs  Lab 06/26/18 1057 06/26/18 1139  NA  --  140  K  --  4.0  CL  --  105  CO2  --  25  GLUCOSE  --  113*  BUN  --  11  CREATININE 1.10* 1.11*  CALCIUM  --  8.5*   GFR: Estimated Creatinine Clearance: 47.5 mL/min (A) (by C-G formula based on SCr of 1.11 mg/dL (H)). Liver Function Tests: Recent Labs  Lab 06/26/18 1139  AST 16  ALT 12  ALKPHOS 64  BILITOT 0.7  PROT 5.8*  ALBUMIN 2.3*   No results for input(s): LIPASE, AMYLASE in the last 168 hours. No results for input(s): AMMONIA in the last 168 hours. Coagulation Profile: Recent Labs  Lab  06/26/18 1139  INR 1.14   Cardiac Enzymes: No results for input(s): CKTOTAL, CKMB, CKMBINDEX, TROPONINI in the last 168 hours. BNP (last 3 results) No results for input(s): PROBNP in the last 8760 hours. HbA1C: Recent Labs    06/27/18 0641  HGBA1C 6.5*   CBG: No results for input(s): GLUCAP in the last 168 hours. Lipid Profile: Recent Labs    06/27/18 0641  CHOL 106  HDL 26*  LDLCALC 58  TRIG 111  CHOLHDL 4.1   Thyroid Function Tests: No results for input(s): TSH, T4TOTAL, FREET4, T3FREE, THYROIDAB in the last 72 hours. Anemia Panel: No results for input(s): VITAMINB12, FOLATE, FERRITIN, TIBC, IRON, RETICCTPCT in the last 72 hours. Sepsis Labs: No results for input(s): PROCALCITON, LATICACIDVEN in the last 168 hours.  No results found for this or any previous visit (from the past 240 hour(s)).       Radiology Studies: Mr Brain 65 Contrast  Result Date: 06/27/2018 CLINICAL DATA:  Initial event do a shin for left hand numbness and slurred speech, recent stroke. EXAM: MRI HEAD WITHOUT CONTRAST TECHNIQUE: Multiplanar, multiecho pulse sequences of the brain and surrounding structures were obtained without intravenous contrast. COMPARISON:  Prior head CT from 06/26/2018 as well as recent MRI from 06/22/2018. FINDINGS: Examination is limited with only axial coronal DWI and ADC sequences performed. Normal expected interval evolution of recently identified infarcts involving the right cerebellum, left parieto-occipital region, right occipital region, and high right frontoparietal region, relatively stable in size and distribution as compared to previous MRI from 06/22/2018. However, there is a new area of restricted diffusion involving the cortical and subcortical right frontal lobe, not seen on previous, and consistent with a new ischemic infarct (series 5, image 76). Infarct fairly linear measuring up to approximately 3.5 cm in length. Infarct primarily involves the right frontal  operculum. No other evidence for new or interval ischemia. No other definite new acute intracranial abnormality seen on this limited exam. IMPRESSION: 1. 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 2. Otherwise, normal expected interval evolution of additional scattered acute to subacute infarcts involving the bilateral cerebral hemispheres and right cerebellum, relatively stable in size and distribution from previous. Electronically Signed   By: Jeannine Boga M.D.   On: 06/27/2018 04:05   Ct Abdomen Pelvis W Contrast  Result Date: 06/27/2018 CLINICAL DATA:  Inpatient. 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 Restaging. EXAM: CT ABDOMEN AND PELVIS WITH CONTRAST TECHNIQUE: Multidetector CT imaging of the abdomen and pelvis was performed using the standard protocol following bolus administration of intravenous contrast. CONTRAST:  13mL OMNIPAQUE IOHEXOL 300 MG/ML  SOLN COMPARISON:  12/31/2017 PET-CT. FINDINGS: Lower chest: Extensive patchy perilobular consolidation and ground-glass opacity throughout both lung bases, significantly increased from 06/08/2018 chest CT. Coronary atherosclerosis. Trace dependent bilateral pleural effusions. Hepatobiliary: Normal liver size. Simple 1.0 cm inferior right liver cyst. No additional liver lesions. Normal gallbladder with no radiopaque cholelithiasis. No biliary ductal dilatation. Pancreas: Normal, with no mass or duct dilation. Spleen: Normal size. No mass. Adrenals/Urinary Tract: Normal  adrenals. Asymmetric right renal atrophy, unchanged. No hydronephrosis. No renal masses. Normal bladder. Stomach/Bowel: Small hiatal hernia. Otherwise normal nondistended stomach. Normal caliber small bowel with no small bowel wall thickening. Normal appendix. Mild sigmoid diverticulosis,  with no large bowel wall thickening or significant pericolonic fat stranding. Vascular/Lymphatic: Atherosclerotic nonaneurysmal abdominal aorta. Patent portal, splenic, hepatic and renal veins. No pathologically enlarged lymph nodes in the abdomen or pelvis. Reproductive: Grossly normal uterus.  No adnexal mass. Other: No pneumoperitoneum, ascites or focal fluid collection. Musculoskeletal: No aggressive appearing focal osseous lesions. Moderate thoracolumbar spondylosis. IMPRESSION: 1. No evidence of metastatic disease in the abdomen or pelvis. 2. Extensive patchy perilobular consolidation and ground-glass opacity throughout both lung bases, significantly increased since recent 06/08/2018 chest CT. The distribution is suggestive of organizing pneumonia such as due to drug toxicity. Multilobar infectious or aspiration pneumonia is a consideration. 3. Trace dependent bilateral pleural effusions. 4. Small hiatal hernia. 5. Mild sigmoid diverticulosis. 6. Aortic Atherosclerosis (ICD10-I70.0). Electronically Signed   By: Ilona Sorrel M.D.   On: 06/27/2018 02:06   Vas [place] Lower Extremity Venous (dvt)  Result Date: 06/27/2018  Lower Venous Study Indications: Stroke.  Limitations: Patient unable to fully turn knees out. Comparison Study: No prior study on file Performing Technologist: Sharion Dove RVS  Examination Guidelines: A complete evaluation includes B-mode imaging, spectral Doppler, color Doppler, and power Doppler as needed of all accessible portions of each vessel. Bilateral testing is considered an integral part of a complete examination. Limited examinations for reoccurring indications may be performed as noted.  Right Venous Findings: +---------+---------------+---------+-----------+----------+-------+          CompressibilityPhasicitySpontaneityPropertiesSummary +---------+---------------+---------+-----------+----------+-------+ CFV      Full           Yes      Yes                           +---------+---------------+---------+-----------+----------+-------+ SFJ      Full                                                 +---------+---------------+---------+-----------+----------+-------+ FV Prox  Full                                                 +---------+---------------+---------+-----------+----------+-------+ FV Mid   Full                                                 +---------+---------------+---------+-----------+----------+-------+ FV DistalFull                                                 +---------+---------------+---------+-----------+----------+-------+ PFV      Full                                                 +---------+---------------+---------+-----------+----------+-------+  POP      Full           Yes      Yes                          +---------+---------------+---------+-----------+----------+-------+ PTV      Full                                                 +---------+---------------+---------+-----------+----------+-------+ PERO     Full                                                 +---------+---------------+---------+-----------+----------+-------+  Left Venous Findings: +---------+---------------+---------+-----------+----------+----------+          CompressibilityPhasicitySpontaneityPropertiesSummary    +---------+---------------+---------+-----------+----------+----------+ CFV      Full                                                    +---------+---------------+---------+-----------+----------+----------+ SFJ      Full                                                    +---------+---------------+---------+-----------+----------+----------+ FV Prox  Full                                                    +---------+---------------+---------+-----------+----------+----------+ FV Mid   Full                                                     +---------+---------------+---------+-----------+----------+----------+ FV DistalFull                                                    +---------+---------------+---------+-----------+----------+----------+ PFV      Full                                                    +---------+---------------+---------+-----------+----------+----------+ POP                     Yes      Yes                  visualized +---------+---------------+---------+-----------+----------+----------+ PTV      Full                                                    +---------+---------------+---------+-----------+----------+----------+  PERO     Full                                                    +---------+---------------+---------+-----------+----------+----------+    Summary: Right: There is no evidence of deep vein thrombosis in the lower extremity. Left: There is no evidence of deep vein thrombosis in the lower extremity. However, portions of this examination were limited- see technologist comments above.  *See table(s) above for measurements and observations. Electronically signed by Monica Martinez MD on 06/27/2018 at 5:50:20 PM.    Final         Scheduled Meds: . amLODipine  5 mg Oral Daily  . atorvastatin  20 mg Oral Daily  . carvedilol  12.5 mg Oral BID WC  . levofloxacin  750 mg Oral Q lunch  . magnesium oxide  400 mg Oral BID  . pantoprazole  40 mg Oral Daily  . rivaroxaban  20 mg Oral Q supper  . sodium chloride flush  3 mL Intravenous Once   Continuous Infusions: . sodium chloride 75 mL/hr at 06/28/18 0605     LOS: 1 day    Time spent: 9 min    Nicolette Bang, MD [facility]  If 7PM-7AM, please contact night-coverage  06/28/2018, 11:31 AM

## 2018-06-28 NOTE — Progress Notes (Signed)
Occupational Therapy Treatment Patient Details Name: Michele [name] MRN: [id number] DOB: 04/18/1946 Today's Date: 06/28/2018    History of present illness Pt is a 73 y.o. female with medical history significant of hypertension, hyperlipidemia, lung cancer status post chemo and radiation who finished chemotherapy on November 2019, thrombocytopenia, recent admission to [facility] for pneumonia and then subsequent stroke. She was discharged on 06/22/2018. However, morning of 06/26/2018 she experienced left sided numbness, slurred speech, and facial droop and returned to the ED. MRI revealing new acute ischemic infarct involving the cortical and subcortical R frontal lobe at the level of the R frontal operculum.   OT comments  Patient supine in bed and agreeable to OT.  Requires increased time and effort for all activities, min guard for bed mobility and min assist for transfers using RW.  Patient remains on supplemental oxygen at 3L, maintain saturations greater than 90 during activity and resting.  Presents with generalized weakness and decreased activity tolerance.  Continues to present with R visual loss, upper quadrant greater than lower quadrant, unable to read clock but reports this is baseline.  Will benefit from further visual testing, and compensatory techniques. Will follow, CIR remains appropriate.    Follow Up Recommendations  Supervision/Assistance - 24 hour;CIR    Equipment Recommendations  Other (comment)(TBD by next venue of care)    Recommendations for Other Services      Precautions / Restrictions Precautions Precautions: Fall Precaution Comments: on 3L of O2 at baseline Restrictions Weight Bearing Restrictions: No       Mobility Bed Mobility Overal bed mobility: Needs Assistance Bed Mobility: Supine to Sit     Supine to sit: Min guard     General bed mobility comments: increased time and effort, HOB elevated, pt achieving upright sitting at EOB towards her L  side  Transfers Overall transfer level: Needs assistance Equipment used: Rolling walker (2 wheeled) Transfers: Sit to/from [facility] Sit to Stand: Min assist Stand pivot transfers: Min assist       General transfer comment: increased time and effort, cueing for hand placement and safety     Balance Overall balance assessment: Needs assistance Sitting-balance support: Feet supported Sitting balance-Leahy Scale: Fair     Standing balance support: Bilateral upper extremity supported;During functional activity Standing balance-Leahy Scale: Poor Standing balance comment: reliant on UE support                           ADL either performed or assessed with clinical judgement   ADL Overall ADL's : Needs assistance/impaired     Grooming: Wash/dry hands;Wash/dry face;Oral care;Set up;Sitting                   Toilet Transfer: Minimal assistance;Stand-pivot;RW Toilet Transfer Details (indicate cue type and reason): simulated to recliner            General ADL Comments: pt with decreased activity tolerance, generalized weakness      Vision   Vision Assessment?: Yes Eye Alignment: Within Functional Limits Ocular Range of Motion: Within Functional Limits Alignment/Gaze Preference: Within Defined Limits Tracking/Visual Pursuits: Able to track stimulus in all quads without difficulty Visual Fields: (R visual field loss ) Additional Comments: patient appears to have R visual field loss, upper quadrent > lower quadrent. reports baseline    Perception     Praxis      Cognition Arousal/Alertness: Awake/alert Behavior During Therapy: Flat affect Overall Cognitive Status: Impaired/Different from baseline Area  of Impairment: Safety/judgement;Problem solving                         Safety/Judgement: Decreased awareness of safety   Problem Solving: Slow processing;Decreased initiation;Requires verbal cues General Comments: pt  with flat affect, oriented. follows commands, increased time to process and sequence tasks         Exercises     Shoulder Instructions       General Comments reviewed visual compensatory techniques with scanning     Pertinent Vitals/ Pain       Pain Assessment: No/denies pain  Home Living                                          Prior Functioning/Environment              Frequency  Min 2X/week        Progress Toward Goals  OT Goals(current goals can now be found in the care plan section)  Progress towards OT goals: Progressing toward goals  Acute Rehab OT Goals Patient Stated Goal: to feel better OT Goal Formulation: With patient Time For Goal Achievement: 07/11/18 Potential to Achieve Goals: Good  Plan Discharge plan remains appropriate;Frequency remains appropriate    Co-evaluation                 AM-PAC OT "6 Clicks" Daily Activity     Outcome Measure   Help from another person eating meals?: A Little Help from another person taking care of personal grooming?: A Little Help from another person toileting, which includes using toliet, bedpan, or urinal?: A Lot Help from another person bathing (including washing, rinsing, drying)?: A Lot Help from another person to put on and taking off regular upper body clothing?: A Little Help from another person to put on and taking off regular lower body clothing?: A Lot 6 Click Score: 15    End of Session Equipment Utilized During Treatment: Oxygen;Rolling walker  OT Visit Diagnosis: Other abnormalities of gait and mobility (R26.89);Muscle weakness (generalized) (M62.81);Low vision, both eyes (H54.2)   Activity Tolerance Patient tolerated treatment well   Patient Left in chair;with chair alarm set;with call bell/phone within reach;with family/visitor present   Nurse Communication          Time: 9163-8466 OT Time Calculation (min): 31 min  Charges: OT General Charges $OT Visit: 1  Visit OT Treatments $Self Care/Home Management : 23-37 mins  Delight Stare, OT Acute Rehabilitation Services Pager [phone] Office [place] 06/28/2018, 11:49 AM

## 2018-06-28 NOTE — Progress Notes (Signed)
  Speech Language Pathology Treatment: Dysphagia  Patient Details Name: Michele [name] MRN: [id number] DOB: 08-08-1945 Today's Date: 06/28/2018 Time: 2060-1561 SLP Time Calculation (min) (ACUTE ONLY): 20 min  Assessment / Plan / Recommendation Clinical Impression  Pt was seen for dysphagia treatment to assess tolerance of the recommended diet. Pt's nurse, Gaspar Bidding, reported that the pt has been "coughing constantly" but he has not observed her with p.o. intake a yet. Pt tolerated puree solids and thin lquids via cup and straw without overt s/sx of aspiration. Pt continues to exhibit a bolus hold prior to initiation of the swallow. She continues to have a baseline cough and two instances of coughing were noted during the session but did not appear to be related to trials. Pt tolerated dysphagia 2 trials without overt s/sx of aspiration but mastication time was moderately increased. No significant oral residue was noted but mildly reduced labial stripping was noted to the left. SLP will continue to follow.    HPI HPI: Pt is a 73 yo female admitted with L sided weakness, numbness, and facial droop. MRI showed an acute R frontal CVA. PMH: HTN, HLD, lung cancer s/p chemo and XRT, CVA      SLP Plan     Patient needs continued Speech Lanaguage Pathology Services    Recommendations  Diet recommendations: Dysphagia 1 (puree);Thin liquid Liquids provided via: Straw;Cup Medication Administration: Crushed with puree Supervision: Patient able to self feed Compensations: Slow rate;Small sips/bites;Follow solids with liquid;Monitor for anterior loss Postural Changes and/or Swallow Maneuvers: Seated upright 90 degrees;Upright 30-60 min after meal                Oral Care Recommendations: Oral care BID Follow up Recommendations: Inpatient Rehab(tba) SLP Visit Diagnosis: Cognitive communication deficit (R41.841);Dysarthria and anarthria (R47.1)       Shanese Riemenschneider I. Hardin Negus, [place], [place] Office number [phone] Pager [phone]               Horton Marshall 06/28/2018, 8:58 AM

## 2018-06-28 NOTE — Consult Note (Signed)
Physical Medicine and Rehabilitation Consult Reason for Consult: CVA Referring Physician: Marcell Anger, MD   HPI: Michele [name] is a 73 y.o. female with past medical history of thrombocytopenia, anemia, hyponatremia, hypertension, CVA, A. fib, lung cancer, CKD presented on 06/26/2018 with stroke.  History taken from chart review and patient.  Patient was recently admitted to the hospital in early February with pneumonia and subsequent stroke.  She was not started on Eliquis due to thrombocytopenia.  She was referred for loop recorder which was scheduled for next week.  Her stroke symptoms are getting better until 2/7, when she started to experience left-sided weakness.  She presented to [facility].  At time of admission deficits had significantly improved.  Neurology was consulted.  MRI brain reviewed, showing new relatively small right-sided infarcts with previous left infarcts.  Per report, new acute ischemic infarct involving the cortical and subcortical right frontal lobe.  Hospital course further complicated by tachypnea, acute blood loss anemia, hypoalbuminemia.   Review of Systems  Respiratory: Positive for shortness of breath.   Neurological: Positive for dizziness and weakness.  All other systems reviewed and are negative.  Past Medical History:  Diagnosis Date  . A-fib ([place])   . Acute kidney failure, unspecified ([place])   . Cancer ([place])    lung  . CKD (chronic kidney disease), stage III ([place])   . CVA (cerebral vascular accident) ([place])   . Hyperlipidemia   . Hypertension   . Hyponatremia   . Lung cancer ([place])   . Normocytic anemia   . Stroke ([place])   . Thrombocytopenia, unspecified ([place])   . Vision loss    Past Surgical History:  Procedure Laterality Date  . ENDOBRONCHIAL ULTRASOUND Bilateral 01/08/2018   Procedure: ENDOBRONCHIAL ULTRASOUND;  Surgeon: Garner Nash, DO;  Location: [facility] ENDOSCOPY;  Service: Cardiopulmonary;  Laterality: Bilateral;  . FINE  NEEDLE ASPIRATION  01/08/2018   Procedure: FINE NEEDLE ASPIRATION (FNA) LINEAR;  Surgeon: Garner Nash, DO;  Location: [facility] ENDOSCOPY;  Service: Cardiopulmonary;;  . FLEXIBLE BRONCHOSCOPY  01/08/2018   Procedure: FLEXIBLE BRONCHOSCOPY;  Surgeon: Garner Nash, DO;  Location: [facility] ENDOSCOPY;  Service: Cardiopulmonary;;  . TUBAL LIGATION     Family History  Problem Relation Age of Onset  . Dementia Mother   . Breast cancer Mother   . Heart attack Father    Social History:  reports that she quit smoking about 10 years ago. Her smoking use included cigarettes. She has a 25.00 pack-year smoking history. She has never used smokeless tobacco. She reports that she does not drink alcohol or use drugs. Allergies: No Known Allergies Medications Prior to Admission  Medication Sig Dispense Refill  . acetaminophen (TYLENOL) 325 MG tablet Take 650 mg by mouth every 6 (six) hours as needed for fever.    Marland Kitchen amLODipine (NORVASC) 5 MG tablet Take 5 mg by mouth daily.    Marland Kitchen aspirin EC 81 MG tablet Take 81 mg by mouth daily.    Marland Kitchen atorvastatin (LIPITOR) 20 MG tablet Take 20 mg by mouth daily.  3  . carvedilol (COREG) 12.5 MG tablet Take 12.5 mg by mouth 2 (two) times daily with a meal.    . ibuprofen (ADVIL,MOTRIN) 200 MG tablet Take 400 mg by mouth every 6 (six) hours as needed for headache or moderate pain.    Marland Kitchen levofloxacin (LEVAQUIN) 750 MG tablet Take 750 mg by mouth daily.    . Magnesium Oxide 400 (240 Mg) MG  TABS Take 400 mg by mouth 2 (two) times daily.    Marland Kitchen omeprazole (PRILOSEC) 20 MG capsule Take 20 mg by mouth daily.      Home: Home Living Family/patient expects to be discharged to:: Private residence Living Arrangements: Spouse/significant other Available Help at Discharge: Family, Available 24 hours/day Type of Home: House Home Access: Stairs to enter [employer] of Steps: 4 Entrance Stairs-Rails: Right, Left Home Layout: One level Bathroom Shower/Tub: Tub/shower unit Home  Equipment: Environmental consultant - 2 wheels, Cane - single point, Hand held shower head, Bedside commode, Wheelchair - manual(pt's sister in law has a BSC and w/c) Additional Comments: on 3L of O2 at all times since previous admission  Lives With: Spouse  Functional History: Prior Function Level of Independence: Independent Functional Status:  Mobility: Bed Mobility Overal bed mobility: Needs Assistance Bed Mobility: Supine to Sit Supine to sit: Min guard General bed mobility comments: increased time and effort, HOB elevated, pt achieving upright sitting at EOB towards her L side Transfers Overall transfer level: Needs assistance Equipment used: Rolling walker (2 wheeled) Transfers: Sit to/from Stand, [employer] Transfers Sit to Stand: Min assist Stand pivot transfers: Min assist General transfer comment: increased time and effort, cueing for hand placement and safety  Ambulation/Gait General Gait Details: unable to tolerate during evaluation    ADL: ADL Overall ADL's : Needs assistance/impaired Grooming: Wash/dry hands, Wash/dry face, Oral care, Set up, Sitting Toilet Transfer: Minimal assistance, Stand-pivot, RW Toilet Transfer Details (indicate cue type and reason): simulated to recliner  General ADL Comments: pt with decreased activity tolerance, generalized weakness   Cognition: Cognition Overall Cognitive Status: Impaired/Different from baseline Arousal/Alertness: Awake/alert Orientation Level: Oriented X4 Attention: Sustained Sustained Attention: Appears intact Memory: Impaired Memory Impairment: Decreased recall of new information Awareness: Impaired Awareness Impairment: Intellectual impairment Cognition Arousal/Alertness: Awake/alert Behavior During Therapy: Flat affect Overall Cognitive Status: Impaired/Different from baseline Area of Impairment: Safety/judgement, Problem solving Safety/Judgement: Decreased awareness of safety Problem Solving: Slow processing, Decreased  initiation, Requires verbal cues General Comments: pt with flat affect, oriented. follows commands, increased time to process and sequence tasks   Blood pressure 108/70, pulse 79, temperature 98 F (36.7 C), temperature source Axillary, resp. rate 18, height _0  (1.626 m), weight 82.1 kg, SpO2 93 %. Physical Exam  Constitutional: She appears well-developed.  Obese  HENT:  Head: Normocephalic and atraumatic.  Eyes: EOM are normal. Right eye exhibits no discharge. Left eye exhibits no discharge.  Neck: Normal range of motion. No thyromegaly present.  Respiratory: Effort normal. No respiratory distress.  + [place]  GI: She exhibits no distension.  Musculoskeletal:     Comments: No edema in extremities  Neurological: She is alert.  Motor: Greater than 4/5 throughout- transferring from bed to bedside commode with steadying assist  Skin: No rash noted. No erythema.  Psychiatric: She has a normal mood and affect. Her behavior is normal.    Results for orders placed or performed during the hospital encounter of 06/26/18 (from the past 24 hour(s))  CBC with Differential/Platelet     Status: Abnormal   Collection Time: 06/28/18  6:35 AM  Result Value Ref Range   WBC 5.6 4.0 - 10.5 K/uL   RBC 3.09 (L) 3.87 - 5.11 MIL/uL   Hemoglobin 9.3 (L) 12.0 - 15.0 g/dL   HCT 29.5 (L) 36.0 - 46.0 %   MCV 95.5 80.0 - 100.0 fL   MCH 30.1 26.0 - 34.0 pg   MCHC 31.5 30.0 - 36.0 g/dL  RDW 15.4 11.5 - 15.5 %   Platelets 96 (L) 150 - 400 K/uL   nRBC 0.0 0.0 - 0.2 %   Neutrophils Relative % 73 %   Neutro Abs 4.1 1.7 - 7.7 K/uL   Lymphocytes Relative 10 %   Lymphs Abs 0.5 (L) 0.7 - 4.0 K/uL   Monocytes Relative 12 %   Monocytes Absolute 0.7 0.1 - 1.0 K/uL   Eosinophils Relative 4 %   Eosinophils Absolute 0.2 0.0 - 0.5 K/uL   Basophils Relative 0 %   Basophils Absolute 0.0 0.0 - 0.1 K/uL   Immature Granulocytes 1 %   Abs Immature Granulocytes 0.05 0.00 - 0.07 K/uL   Mr Brain Wo Contrast  Result Date:  06/27/2018 CLINICAL DATA:  Initial event do a shin for left hand numbness and slurred speech, recent stroke. EXAM: MRI HEAD WITHOUT CONTRAST TECHNIQUE: Multiplanar, multiecho pulse sequences of the brain and surrounding structures were obtained without intravenous contrast. COMPARISON:  Prior head CT from 06/26/2018 as well as recent MRI from 06/22/2018. FINDINGS: Examination is limited with only axial coronal DWI and ADC sequences performed. Normal expected interval evolution of recently identified infarcts involving the right cerebellum, left parieto-occipital region, right occipital region, and high right frontoparietal region, relatively stable in size and distribution as compared to previous MRI from 06/22/2018. However, there is a new area of restricted diffusion involving the cortical and subcortical right frontal lobe, not seen on previous, and consistent with a new ischemic infarct (series 5, image 76). Infarct fairly linear measuring up to approximately 3.5 cm in length. Infarct primarily involves the right frontal operculum. No other evidence for new or interval ischemia. No other definite new acute intracranial abnormality seen on this limited exam. IMPRESSION: 1. 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 2. Otherwise, normal expected interval evolution of additional scattered acute to subacute infarcts involving the bilateral cerebral hemispheres and right cerebellum, relatively stable in size and distribution from previous. Electronically Signed   By: Jeannine Boga M.D.   On: 06/27/2018 04:05   Ct Abdomen Pelvis W Contrast  Result Date: 06/27/2018 CLINICAL DATA:  Inpatient. 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 Restaging. EXAM: CT ABDOMEN AND PELVIS WITH CONTRAST TECHNIQUE: Multidetector CT  imaging of the abdomen and pelvis was performed using the standard protocol following bolus administration of intravenous contrast. CONTRAST:  141m OMNIPAQUE IOHEXOL 300 MG/ML  SOLN COMPARISON:  12/31/2017 PET-CT. FINDINGS: Lower chest: Extensive patchy perilobular consolidation and ground-glass opacity throughout both lung bases, significantly increased from 06/08/2018 chest CT. Coronary atherosclerosis. Trace dependent bilateral pleural effusions. Hepatobiliary: Normal liver size. Simple 1.0 cm inferior right liver cyst. No additional liver lesions. Normal gallbladder with no radiopaque cholelithiasis. No biliary ductal dilatation. Pancreas: Normal, with no mass or duct dilation. Spleen: Normal size. No mass. Adrenals/Urinary Tract: Normal adrenals. Asymmetric right renal atrophy, unchanged. No hydronephrosis. No renal masses. Normal bladder. Stomach/Bowel: Small hiatal hernia. Otherwise normal nondistended stomach. Normal caliber small bowel with no small bowel wall thickening. Normal appendix. Mild sigmoid diverticulosis, with no large bowel wall thickening or significant pericolonic fat stranding. Vascular/Lymphatic: Atherosclerotic nonaneurysmal abdominal aorta. Patent portal, splenic, hepatic and renal veins. No pathologically enlarged lymph nodes in the abdomen or pelvis. Reproductive: Grossly normal uterus.  No adnexal mass. Other: No pneumoperitoneum, ascites or focal fluid collection. Musculoskeletal: No aggressive  appearing focal osseous lesions. Moderate thoracolumbar spondylosis. IMPRESSION: 1. No evidence of metastatic disease in the abdomen or pelvis. 2. Extensive patchy perilobular consolidation and ground-glass opacity throughout both lung bases, significantly increased since recent 06/08/2018 chest CT. The distribution is suggestive of organizing pneumonia such as due to drug toxicity. Multilobar infectious or aspiration pneumonia is a consideration. 3. Trace dependent bilateral pleural  effusions. 4. Small hiatal hernia. 5. Mild sigmoid diverticulosis. 6. Aortic Atherosclerosis (ICD10-I70.0). Electronically Signed   By: Ilona Sorrel M.D.   On: 06/27/2018 02:06   Vas [place] Lower Extremity Venous (dvt)  Result Date: 06/27/2018  Lower Venous Study Indications: Stroke.  Limitations: Patient unable to fully turn knees out. Comparison Study: No prior study on file Performing Technologist: Sharion Dove RVS  Examination Guidelines: A complete evaluation includes B-mode imaging, spectral Doppler, color Doppler, and power Doppler as needed of all accessible portions of each vessel. Bilateral testing is considered an integral part of a complete examination. Limited examinations for reoccurring indications may be performed as noted.  Right Venous Findings: +---------+---------------+---------+-----------+----------+-------+          CompressibilityPhasicitySpontaneityPropertiesSummary +---------+---------------+---------+-----------+----------+-------+ CFV      Full           Yes      Yes                          +---------+---------------+---------+-----------+----------+-------+ SFJ      Full                                                 +---------+---------------+---------+-----------+----------+-------+ FV Prox  Full                                                 +---------+---------------+---------+-----------+----------+-------+ FV Mid   Full                                                 +---------+---------------+---------+-----------+----------+-------+ FV DistalFull                                                 +---------+---------------+---------+-----------+----------+-------+ PFV      Full                                                 +---------+---------------+---------+-----------+----------+-------+ POP      Full           Yes      Yes                          +---------+---------------+---------+-----------+----------+-------+  PTV      Full                                                 +---------+---------------+---------+-----------+----------+-------+  PERO     Full                                                 +---------+---------------+---------+-----------+----------+-------+  Left Venous Findings: +---------+---------------+---------+-----------+----------+----------+          CompressibilityPhasicitySpontaneityPropertiesSummary    +---------+---------------+---------+-----------+----------+----------+ CFV      Full                                                    +---------+---------------+---------+-----------+----------+----------+ SFJ      Full                                                    +---------+---------------+---------+-----------+----------+----------+ FV Prox  Full                                                    +---------+---------------+---------+-----------+----------+----------+ FV Mid   Full                                                    +---------+---------------+---------+-----------+----------+----------+ FV DistalFull                                                    +---------+---------------+---------+-----------+----------+----------+ PFV      Full                                                    +---------+---------------+---------+-----------+----------+----------+ POP                     Yes      Yes                  visualized +---------+---------------+---------+-----------+----------+----------+ PTV      Full                                                    +---------+---------------+---------+-----------+----------+----------+ PERO     Full                                                    +---------+---------------+---------+-----------+----------+----------+    Summary: Right: There is no evidence of deep vein thrombosis in the lower extremity. Left:  There is no evidence of deep vein thrombosis  in the lower extremity. However, portions of this examination were limited- see technologist comments above.  *See table(s) above for measurements and observations. Electronically signed by Monica Martinez MD on 06/27/2018 at 5:50:20 PM.    Final     Assessment/Plan: Diagnosis: Right frontal lobe infarcts Labs and images (see above) independently reviewed.  Records reviewed and summated above.  1. Does the need for close, 24 hr/day medical supervision in concert with the patient's rehab needs make it unreasonable for this patient to be served in a less intensive setting? Potentially  2. Co-Morbidities requiring supervision/potential complications: tachypnea (monitor RR and O2 Sats with increased physical exertion), ABLA (repeat labs, transfuse to ensure appropriate perfusion for increased activity tolerance), hypoalbuminemia (maximize nutrition for overall health and wound healing), thrombocytopenia, hyponatremia, HTN (monitor and provide prns in accordance with increased physical exertion and pain), CVA, A. fib, lung cancer, CKD (avoid nephrotoxic meds) 3. Due to safety, disease management and patient education, does the patient require 24 hr/day rehab nursing? Yes 4. Does the patient require coordinated care of a physician, rehab nurse, PT (1-2 hrs/day, 5 days/week) and OT (1-2 hrs/day, 5 days/week) to address physical and functional deficits in the context of the above medical diagnosis(es)? Potentially Addressing deficits in the following areas: balance, endurance, locomotion, transferring, bathing, dressing, toileting and psychosocial support 5. Can the patient actively participate in an intensive therapy program of at least 3 hrs of therapy per day at least 5 days per week? Yes 6. The potential for patient to make measurable gains while on inpatient rehab is excellent 7. Anticipated functional outcomes upon discharge from inpatient rehab are modified independent  with PT, modified independent  with OT, n/a with SLP. 8. Estimated rehab length of stay to reach the above functional goals is: NA 9. Anticipated D/C setting: Home 10. Anticipated post D/C treatments: HH therapy and Home excercise program 11. Overall Rehab/Functional Prognosis: good  RECOMMENDATIONS: This patient's condition is appropriate for continued rehabilitative care in the following setting: Patient appears to be progressing well and does not appear to require CIR at this time.  Recommend home with home health. Patient has agreed to participate in recommended program. Potentially Note that insurance prior authorization may be required for reimbursement for recommended care.  Comment: Rehab Admissions Coordinator to follow up.   Delice Lesch, MD, Maxine Glenn 06/28/2018

## 2018-06-29 ENCOUNTER — Institutional Professional Consult (permissible substitution): Payer: Medicare Other | Admitting: Cardiology

## 2018-06-29 DIAGNOSIS — I4891 Unspecified atrial fibrillation: Secondary | ICD-10-CM

## 2018-06-29 DIAGNOSIS — D62 Acute posthemorrhagic anemia: Secondary | ICD-10-CM

## 2018-06-29 DIAGNOSIS — Z8673 Personal history of transient ischemic attack (TIA), and cerebral infarction without residual deficits: Secondary | ICD-10-CM

## 2018-06-29 DIAGNOSIS — N189 Chronic kidney disease, unspecified: Secondary | ICD-10-CM

## 2018-06-29 MED ORDER — RIVAROXABAN 20 MG PO TABS: 20.0000 mg | ORAL_TABLET | Freq: Every day | ORAL | 2 refills | Status: AC

## 2018-06-29 NOTE — Plan of Care (Signed)
Adequate for discharge.

## 2018-06-29 NOTE — Progress Notes (Signed)
Physical Therapy Treatment Patient Details Name: Michele [name] MRN: [id number] DOB: [birth year] Today's Date: 06/29/2018    History of Present Illness Pt is a 73 y.o. female with medical history significant of hypertension, hyperlipidemia, lung cancer status post chemo and radiation who finished chemotherapy on November 2019, thrombocytopenia, recent admission to [facility] for pneumonia and then subsequent stroke. She was discharged on 06/22/2018. However, morning of 06/26/2018 she experienced left sided numbness, slurred speech, and facial droop and returned to the ED. MRI revealing new acute ischemic infarct involving the cortical and subcortical R frontal lobe at the level of the R frontal operculum.    PT Comments    Patient progressing with in room ambulation despite fatigue and decreased O2 sats on 4L O2.  Spouse concerned about home entry and discussed options.  Feel best plan is for home with HHPT as pt not likely to tolerate CIR level rehab at this time.  PT to follow until d/c.    Follow Up Recommendations  Home health PT;Supervision/Assistance - 24 hour     Equipment Recommendations  None recommended by PT    Recommendations for Other Services       Precautions / Restrictions Precautions Precautions: Fall Precaution Comments: O2 dependent, watch sats with mobility    Mobility  Bed Mobility               General bed mobility comments: up in recliner finishing with SLP  Transfers Overall transfer level: Needs assistance Equipment used: Rolling walker (2 wheeled) Transfers: Sit to/from Stand Sit to Stand: Min assist         General transfer comment: increased time, assist for balance  Ambulation/Gait Ambulation/Gait assistance: Min assist Gait Distance (Feet): 12 Feet(x 2) Assistive device: Rolling walker (2 wheeled) Gait Pattern/deviations: Step-to pattern;Decreased stride length     General Gait Details: rather dyspneic with ambulation to/from bathroom and  SpO2 dropping to 84% on 4L portable O2; recovers above 90% at rest.    Stairs             Wheelchair Mobility    Modified Rankin (Stroke Patients Only) Modified Rankin (Stroke Patients Only) Pre-Morbid Rankin Score: Moderate disability Modified Rankin: Moderately severe disability     Balance Overall balance assessment: Needs assistance   Sitting balance-Leahy Scale: Fair       Standing balance-Leahy Scale: Poor Standing balance comment: reliant on UE support                            Cognition Arousal/Alertness: Awake/alert Behavior During Therapy: Flat affect Overall Cognitive Status: Impaired/Different from baseline Area of Impairment: Safety/judgement;Problem solving                         Safety/Judgement: Decreased awareness of safety;Decreased awareness of deficits   Problem Solving: Slow processing;Decreased initiation;Requires verbal cues        Exercises      General Comments General comments (skin integrity, edema, etc.): spouse present and stressed about d/c asking multiple questions and concerned about O2 tank to go home with RN case manager assisted to get tank ordered and questions answered about best method for home entry as well as possibility for ramp and w/c for home.  Also gave recommendation regarding inpatient rehab which patient likely would not tolerated.       Pertinent Vitals/Pain Pain Assessment: No/denies pain    Home Living  Prior Function            PT Goals (current goals can now be found in the care plan section) Progress towards PT goals: Progressing toward goals    Frequency    Min 3X/week      PT Plan Discharge plan needs to be updated    Co-evaluation              AM-PAC PT "6 Clicks" Mobility   Outcome Measure  Help needed turning from your back to your side while in a flat bed without using bedrails?: A Little Help needed moving from lying on  your back to sitting on the side of a flat bed without using bedrails?: A Little Help needed moving to and from a bed to a chair (including a wheelchair)?: A Little Help needed standing up from a chair using your arms (e.g., wheelchair or bedside chair)?: A Little Help needed to walk in hospital room?: A Little Help needed climbing 3-5 steps with a railing? : A Lot 6 Click Score: 17    End of Session Equipment Utilized During Treatment: Gait belt;Oxygen Activity Tolerance: Patient limited by fatigue Patient left: in chair;with family/visitor present;with call bell/phone within reach   PT Visit Diagnosis: Other abnormalities of gait and mobility (R26.89);Other symptoms and signs involving the nervous system (R29.898)     Time: 1552-0802 PT Time Calculation (min) (ACUTE ONLY): 24 min  Charges:  $Gait Training: 8-22 mins $Self Care/Home Management: [place] [phone] 06/29/2018    Reginia Naas 06/29/2018, 12:12 PM

## 2018-06-29 NOTE — Care Management Note (Signed)
Case Management Note  Patient Details  Name: Michele [name] MRN: [id number] Date of Birth: 1945/12/16  Subjective/Objective:     Pt admitted with a stroke. She is from home with her spouse.  DME: home oxygen through: [facility] walker, shower chair No issues obtaining home meds. No issues with transportation.               Action/Plan: Pt discharging home with orders for [facility] services. CM provided her choice and spouse states they are active with [facility]. CM called and verified and faxed them the new [facility] orders.  Pt without a tank for the transport home. CM called [facility] and they delivered a tank to the room for the ride.  Spouse to provide transportation home.  Expected Discharge Date:  06/29/18               Expected Discharge Plan:  [place]  In-House Referral:     Discharge planning Services  CM Consult  Post Acute Care Choice:  Home Health Choice offered to:  Patient, Spouse  DME Arranged:    DME Agency:     HH Arranged:  PT, Nurse's Aide, Speech Therapy HH Agency:  [facility]  Status of Service:  Completed, signed off  If discussed at [place] of Stay Meetings, dates discussed:    Additional Comments:  Pollie Friar, RN 06/29/2018, 12:33 PM

## 2018-06-29 NOTE — Care Management (Signed)
#    8.   S/W  CHR[facility] @ [facility] # [phone]  XARELTO  20 MG DAILY COVER- YES CO-PAY-  $ 40.00 TIER-  2 DRUG PRIOR APPROVAL- NO  PREFERRED PHARMACY : YES [facility] [place]

## 2018-06-29 NOTE — Progress Notes (Signed)
  Speech Language Pathology Treatment: Dysphagia  Patient Details Name: Michele [name] MRN: [id number] DOB: 07-Mar-1946 Today's Date: 06/29/2018 Time: 1042-1100 SLP Time Calculation (min) (ACUTE ONLY): 18 min  Assessment / Plan / Recommendation Clinical Impression  Pt seen to assure tolerance of po diet and to educate to aspiration precautions.  Spouse present toward end of session and advised pt to go home today.  Pt stated she did not qualify for CIR - confirmed via note from Dr Posey Pronto.    Pt did have decreased recall of her issues with radiation causing burning to her chest - spouse stated this occurrence.  However swallow ability is improving per pt/spouse.    Pt observed consuming water and cracker - dry cough x2/5 boluses which may indicate aspiration.  Given pt to dc today, all vitals stable and baseline smoking causing decreased ability to assess pharyngeal swallow with pt's cough.  She demonstrates adequate oral clearance of solids and admits poor appetite and displeasure with food liminitating intake. Advised advance diet to dys3/thin with strict precautions. Using teach back and written instructions, reviewed with pt s/s of pneumonias and important airway protection mechanisms with pt benefiting from moderate cues to repetition.  Strong cough/hock and heimlich maneuver reviewed with pt and spouse.    Pt continues to be mildly dysarthric - recommend follow up SLP to address higher level cognition, dysarthria and dysphagia.  MBS in future indicated if pt's symptoms don't resolve and she presents with risk of developing pna.     HPI HPI: Pt is a 73 yo female admitted with L sided weakness, numbness, and facial droop. MRI showed an acute R frontal CVA. PMH: HTN, HLD, lung cancer s/p chemo and XRT, CVA.  Pt was started on a dys1/thin diet, follow up to assure tolerance and education indicated.        SLP Plan  Continue with current plan of care       Recommendations  Diet  recommendations: Dysphagia 3 (mechanical soft);Thin liquid Liquids provided via: Cup;Straw Medication Administration: Whole meds with puree Supervision: Patient able to self feed Compensations: Minimize environmental distractions;Slow rate;Small sips/bites Postural Changes and/or Swallow Maneuvers: Seated upright 90 degrees;Upright 30-60 min after meal                Oral Care Recommendations: Oral care BID Follow up Recommendations: Home health SLP SLP Visit Diagnosis: Dysphagia, unspecified (R13.10) Plan: Continue with current plan of care       GO                Macario Golds 06/29/2018, 11:38 AM  Luanna Salk, [place] [facility] SLP [place] Pager [phone] Office [phone]

## 2018-06-29 NOTE — Progress Notes (Signed)
Inpatient Rehabilitation-Admissions Coordinator   Met with pt and her husband at the bedside as follow up from PM&R consult. AC discussed program details and expectations; Both the pt and her husband feel she is unable to tolerate the intensity of the program and are opting for home with home health. AC answered all questions at this time.   AC spoke with CM regarding DC preference. AC will sign off.   Please call if questions.   Jhonnie Garner, OTR/L  Rehab Admissions Coordinator  [phone] 06/29/2018 12:21 PM

## 2018-06-29 NOTE — Discharge Summary (Signed)
Physician Discharge Summary  Michele [name] VFI:[id number] DOB: 05-23-1945 DOA: 06/26/2018  PCP: Ronita Hipps, MD  Admit date: 06/26/2018 Discharge date: 06/29/2018  Admitted From: Inpatient Disposition: home  Recommendations for Outpatient Follow-up:  1. Follow up with PCP in 1-2 weeks 2. Please obtain BMP/CBC in one week 3. Please follow up on the following pending results:  Home Health:Yes Equipment/Devices:none  Discharge Condition:Stable CODE STATUS:Full code Diet recommendation: Regular healthy diet  Brief/Interim Summary:  Michele B Smithis a 73 y.o.femalewith medical history significant ofhypertension, hyperlipidemia, lung cancer status post chemo and radiation who finished chemotherapyonNovember 2019,thrombocytopenia, recent admission to [facility] for pneumonia and then subsequent stroke. She stayed in the hospital about 2 days. She was discharged on 06/22/2018 on aspirin and a statin. She had improved her right leg weakness and was getting physical therapy at home. Today morning at about 915 she went to kitchen and was at her usual state of health. She suddenly felt her left arm is useless, her husband noted facial drooping on the left side and also her voice was slurred. EMS was called. By the time EMS arrived, she had no residual symptoms. Her voice was clear. Patient did not receive TPA at [facility], she has residual right hemianopsia. Patient stated history of irregular heart rate one time when her blood pressure was high and she had fever. She does not have otherwise recorded history of A. fib. At [facility] on discharge, they discussed about starting Eliquis, however her platelet counts were low so they decided to call her for loop recorder placement and she is a scheduled that for next week. ED Course:Hemodynamically stable in the emergency room. Renal functions were stable. Seen by stroke team at the door.Emergent CTA showed known left frontal and occipital lobe  infarction and no new findings. Her neurological deficits almost completely resolved. She was not a TPA candidate.  Hospital course: 1.Acute stroke. Patient was admitted to monitored unit because of severity of symptoms.  She was placed on stroke protocol with neuro checks and vital signs per protocol.  Patient was not a TPA candidate because of recent stroke and minimal deficits.  In the ER she was drinking water without complications and was seen by Michele [name] speech as follow-up with recommendations for dysphagia diet with thin liquids.  Patient was placed on aspirin, statin relative added.  Blood pressure was monitored per stroke protocol MRI brain was obtained which showed acute CVA as noted.  She was on my speech Michele [name] and OT.  Patient was evaluated by inpatient rehabilitation felt patient improved does not need acute inpatient treatment.  Patient is declined skilled nursing facility elected to go home with home health Michele [name] and speech.   2.Lung cancer status post chemo and radiation:Is stabilized now.  Patient will follow-up with her outpatient provider, next appointment this Thursday.  3.Pancytopenia:Her blood counts are improving, most recent white count 5.6, hemoglobin 9.3, platelets 96  4.Hyperlipidemia: Continue with statin, total cholesterol 106 HDL 26 LDL 58 triglycerides 111, continue with heart healthy diet also.  5.Hypertension: BP meds per stroke protocol when admitted.  Patient will now resume her home medication without complication or change.  6. Possible PNA: Noted on CT of abdomen pelvis with consolidation.  Patient was placed on levofloxacin which will continue for 5 doses, unclear this is true pneumonia versus scarring from history of lung cancer with chemo and radiation.  Patient was afebrile white count is normal and she is normotensive complete course of antibiotics.  Discharge Diagnoses:  Principal  Problem:   TIA (transient ischemic attack) Active Problems:    Mass of lower lobe of left lung   Essential hypertension   Hyperlipidemia   Pancytopenia (HCC)   Acute CVA (cerebrovascular accident) ([place])   Tachypnea   Acute blood loss anemia   Hypoalbuminemia due to protein-calorie malnutrition (HCC)   Thrombocytopenia (HCC)   Hyponatremia   History of CVA (cerebrovascular accident)   Atrial fibrillation [facility])   Chronic kidney disease    Discharge Instructions  Discharge Instructions    Ambulatory referral to Neurology   Complete by:  As directed    Follow up with stroke clinic NP (Jessica Vanschaick or Cecille Rubin, if both not available, consider Zachery Dauer, or [facility]) at [facility] in about 4 weeks. Thanks.   Call MD for:   Complete by:  As directed    Any acute change in medical condition   Diet - low sodium heart healthy   Complete by:  As directed    Increase activity slowly   Complete by:  As directed      Allergies as of 06/29/2018   No Known Allergies     Medication List    TAKE these medications   acetaminophen 325 MG tablet Commonly known as:  TYLENOL Take 650 mg by mouth every 6 (six) hours as needed for fever.   amLODipine 5 MG tablet Commonly known as:  NORVASC Take 5 mg by mouth daily.   aspirin EC 81 MG tablet Take 81 mg by mouth daily.   atorvastatin 20 MG tablet Commonly known as:  LIPITOR Take 20 mg by mouth daily.   carvedilol 12.5 MG tablet Commonly known as:  COREG Take 12.5 mg by mouth 2 (two) times daily with a meal.   ibuprofen 200 MG tablet Commonly known as:  ADVIL,MOTRIN Take 400 mg by mouth every 6 (six) hours as needed for headache or moderate pain.   levofloxacin 750 MG tablet Commonly known as:  LEVAQUIN Take 750 mg by mouth daily.   Magnesium Oxide 400 (240 Mg) MG Tabs Take 400 mg by mouth 2 (two) times daily.   omeprazole 20 MG capsule Commonly known as:  PRILOSEC Take 20 mg by mouth daily.   rivaroxaban 20 MG Tabs tablet Commonly known as:  XARELTO Take 1 tablet (20 mg  total) by mouth daily with supper.      Follow-up Information    [facility].   Specialty:  Emergency Medicine Why:  Return as needed for new or worsening symptoms Contact information: [address] [id number] [facility] [facility]. Schedule an appointment as soon as possible for a visit in 4 week(s).   Specialty:  Neurology Contact information: [address] [place] [phone]         No Known Allergies  Consultations:  neurology   Procedures/Studies: Mr Brain Wo Contrast  Result Date: 06/27/2018 CLINICAL DATA:  Initial event do a shin for left hand numbness and slurred speech, recent stroke. EXAM: MRI HEAD WITHOUT CONTRAST TECHNIQUE: Multiplanar, multiecho pulse sequences of the brain and surrounding structures were obtained without intravenous contrast. COMPARISON:  Prior head CT from 06/26/2018 as well as recent MRI from 06/22/2018. FINDINGS: Examination is limited with only axial coronal DWI and ADC sequences performed. Normal expected interval evolution of recently identified infarcts involving the right cerebellum, left parieto-occipital region, right occipital region, and high right frontoparietal region, relatively  stable in size and distribution as compared to previous MRI from 06/22/2018. However, there is a new area of restricted diffusion involving the cortical and subcortical right frontal lobe, not seen on previous, and consistent with a new ischemic infarct (series 5, image 76). Infarct fairly linear measuring up to approximately 3.5 cm in length. Infarct primarily involves the right frontal operculum. No other evidence for new or interval ischemia. No other definite new acute intracranial abnormality seen on this limited exam. IMPRESSION: 1. New acute ischemic infarct involving the cortical and subcortical right  frontal lobe at the level of the right frontal operculum, new relative to recent MRI from 06/22/2018. 2. Otherwise, normal expected interval evolution of additional scattered acute to subacute infarcts involving the bilateral cerebral hemispheres and right cerebellum, relatively stable in size and distribution from previous. Electronically Signed   By: Jeannine Boga M.D.   On: 06/27/2018 04:05   Ct Abdomen Pelvis W Contrast  Result Date: 06/27/2018 CLINICAL DATA:  Inpatient. 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 Restaging. EXAM: CT ABDOMEN AND PELVIS WITH CONTRAST TECHNIQUE: Multidetector CT imaging of the abdomen and pelvis was performed using the standard protocol following bolus administration of intravenous contrast. CONTRAST:  176mL OMNIPAQUE IOHEXOL 300 MG/ML  SOLN COMPARISON:  12/31/2017 PET-CT. FINDINGS: Lower chest: Extensive patchy perilobular consolidation and ground-glass opacity throughout both lung bases, significantly increased from 06/08/2018 chest CT. Coronary atherosclerosis. Trace dependent bilateral pleural effusions. Hepatobiliary: Normal liver size. Simple 1.0 cm inferior right liver cyst. No additional liver lesions. Normal gallbladder with no radiopaque cholelithiasis. No biliary ductal dilatation. Pancreas: Normal, with no mass or duct dilation. Spleen: Normal size. No mass. Adrenals/Urinary Tract: Normal adrenals. Asymmetric right renal atrophy, unchanged. No hydronephrosis. No renal masses. Normal bladder. Stomach/Bowel: Small hiatal hernia. Otherwise normal nondistended stomach. Normal caliber small bowel with no small bowel wall thickening. Normal appendix. Mild sigmoid diverticulosis, with no large bowel wall thickening or significant pericolonic fat stranding. Vascular/Lymphatic: Atherosclerotic nonaneurysmal abdominal aorta. Patent portal, splenic, hepatic and renal veins. No pathologically enlarged lymph  nodes in the abdomen or pelvis. Reproductive: Grossly normal uterus.  No adnexal mass. Other: No pneumoperitoneum, ascites or focal fluid collection. Musculoskeletal: No aggressive appearing focal osseous lesions. Moderate thoracolumbar spondylosis. IMPRESSION: 1. No evidence of metastatic disease in the abdomen or pelvis. 2. Extensive patchy perilobular consolidation and ground-glass opacity throughout both lung bases, significantly increased since recent 06/08/2018 chest CT. The distribution is suggestive of organizing pneumonia such as due to drug toxicity. Multilobar infectious or aspiration pneumonia is a consideration. 3. Trace dependent bilateral pleural effusions. 4. Small hiatal hernia. 5. Mild sigmoid diverticulosis. 6. Aortic Atherosclerosis (ICD10-I70.0). Electronically Signed   By: Ilona Sorrel M.D.   On: 06/27/2018 02:06   Ct Head Code Stroke Wo Contrast  Result Date: 06/26/2018 CLINICAL DATA:  Code stroke.  Left-sided droop and slurred speech EXAM: CT HEAD WITHOUT CONTRAST TECHNIQUE: Contiguous axial images were obtained from the base of the skull through the vertex without intravenous contrast. COMPARISON:  06/20/2018 FINDINGS: Brain: Known subacute infarct in the left PCA distribution affecting the occipital lobe. Small early subacute infarcts seen in the posterior right frontal cortex. Small recent right cerebellar infarct. No acute hemorrhage, hydrocephalus, or collection. Vascular: No hyperdense vessel Skull: Negative Sinuses/Orbits: Secretions layer within the maxillary sinuses. There is bilateral ethmoid and maxillary opacification. Partial bilateral mastoid opacification. These findings were seen on prior brain MRI. Other: These results were communicated to Dr.  Lindzen at 11:06 amon 2/7/2020by text page via the [facility] messaging system. ASPECTS [facility] Stroke Program Early CT Score) Not scored in this clinical setting. IMPRESSION: 1. Known subacute infarcts in the left occipital cortex,  right posterior frontal cortex, and right cerebellum. 2. No evidence of interval infarct or hemorrhagic conversion. 3. Active sinusitis. Electronically Signed   By: Monte Fantasia M.D.   On: 06/26/2018 11:08   Vas [place] Lower Extremity Venous (dvt)  Result Date: 06/27/2018  Lower Venous Study Indications: Stroke.  Limitations: Patient unable to fully turn knees out. Comparison Study: No prior study on file Performing Technologist: Sharion Dove RVS  Examination Guidelines: A complete evaluation includes B-mode imaging, spectral Doppler, color Doppler, and power Doppler as needed of all accessible portions of each vessel. Bilateral testing is considered an integral part of a complete examination. Limited examinations for reoccurring indications may be performed as noted.  Right Venous Findings: +---------+---------------+---------+-----------+----------+-------+          CompressibilityPhasicitySpontaneityPropertiesSummary +---------+---------------+---------+-----------+----------+-------+ CFV      Full           Yes      Yes                          +---------+---------------+---------+-----------+----------+-------+ SFJ      Full                                                 +---------+---------------+---------+-----------+----------+-------+ FV Prox  Full                                                 +---------+---------------+---------+-----------+----------+-------+ FV Mid   Full                                                 +---------+---------------+---------+-----------+----------+-------+ FV DistalFull                                                 +---------+---------------+---------+-----------+----------+-------+ PFV      Full                                                 +---------+---------------+---------+-----------+----------+-------+ POP      Full           Yes      Yes                           +---------+---------------+---------+-----------+----------+-------+ PTV      Full                                                 +---------+---------------+---------+-----------+----------+-------+ PERO  Full                                                 +---------+---------------+---------+-----------+----------+-------+  Left Venous Findings: +---------+---------------+---------+-----------+----------+----------+          CompressibilityPhasicitySpontaneityPropertiesSummary    +---------+---------------+---------+-----------+----------+----------+ CFV      Full                                                    +---------+---------------+---------+-----------+----------+----------+ SFJ      Full                                                    +---------+---------------+---------+-----------+----------+----------+ FV Prox  Full                                                    +---------+---------------+---------+-----------+----------+----------+ FV Mid   Full                                                    +---------+---------------+---------+-----------+----------+----------+ FV DistalFull                                                    +---------+---------------+---------+-----------+----------+----------+ PFV      Full                                                    +---------+---------------+---------+-----------+----------+----------+ POP                     Yes      Yes                  visualized +---------+---------------+---------+-----------+----------+----------+ PTV      Full                                                    +---------+---------------+---------+-----------+----------+----------+ PERO     Full                                                    +---------+---------------+---------+-----------+----------+----------+    Summary: Right: There is no evidence of deep vein thrombosis in  the lower extremity. Left: There is no evidence  of deep vein thrombosis in the lower extremity. However, portions of this examination were limited- see technologist comments above.  *See table(s) above for measurements and observations. Electronically signed by Monica Martinez MD on 06/27/2018 at 5:50:20 PM.    Final        Subjective: Patient reports feeling much better than yesterday.  She sitting in bedside chair eating breakfast.  Husband at bedside answered all questions.  Discharge Exam: Vitals:   06/29/18 0831 06/29/18 1033  BP: 119/60 128/61  Pulse: 79 77  Resp: 15 14  Temp: 97.6 F (36.4 C) 97.9 F (36.6 C)  SpO2: 94% 94%   Vitals:   06/28/18 2300 06/29/18 0318 06/29/18 0831 06/29/18 1033  BP: (!) 111/45 130/74 119/60 128/61  Pulse:  82 79 77  Resp:   15 14  Temp: 98 F (36.7 C) 98.7 F (37.1 C) 97.6 F (36.4 C) 97.9 F (36.6 C)  TempSrc: Oral Oral Oral Oral  SpO2:  95% 94% 94%  Weight:      Height:        General: Michele [name] is alert, awake, not in acute distress Cardiovascular: RRR, S1/S2 +, no rubs, no gallops Respiratory: CTA bilaterally, no wheezing, no rhonchi Abdominal: Soft, NT, ND, bowel sounds + Extremities: no edema, no cyanosis    The results of significant diagnostics from this hospitalization (including imaging, microbiology, ancillary and laboratory) are listed below for reference.     Microbiology: No results found for this or any previous visit (from the past 240 hour(s)).   Labs: BNP (last 3 results) No results for input(s): BNP in the last 8760 hours. Basic Metabolic Panel: Recent Labs  Lab 06/26/18 1057 06/26/18 1139  NA  --  140  K  --  4.0  CL  --  105  CO2  --  25  GLUCOSE  --  113*  BUN  --  11  CREATININE 1.10* 1.11*  CALCIUM  --  8.5*   Liver Function Tests: Recent Labs  Lab 06/26/18 1139  AST 16  ALT 12  ALKPHOS 64  BILITOT 0.7  PROT 5.8*  ALBUMIN 2.3*   No results for input(s): LIPASE, AMYLASE in the last 168  hours. No results for input(s): AMMONIA in the last 168 hours. CBC: Recent Labs  Lab 06/26/18 1139 06/28/18 0635  WBC 7.5 5.6  NEUTROABS 5.8 4.1  HGB 10.8* 9.3*  HCT 34.6* 29.5*  MCV 97.5 95.5  PLT 88* 96*   Cardiac Enzymes: No results for input(s): CKTOTAL, CKMB, CKMBINDEX, TROPONINI in the last 168 hours. BNP: Invalid input(s): POCBNP CBG: No results for input(s): GLUCAP in the last 168 hours. D-Dimer No results for input(s): DDIMER in the last 72 hours. Hgb A1c Recent Labs    06/27/18 0641  HGBA1C 6.5*   Lipid Profile Recent Labs    06/27/18 0641  CHOL 106  HDL 26*  LDLCALC 58  TRIG 111  CHOLHDL 4.1   Thyroid function studies No results for input(s): TSH, T4TOTAL, T3FREE, THYROIDAB in the last 72 hours.  Invalid input(s): FREET3 Anemia work up No results for input(s): VITAMINB12, FOLATE, FERRITIN, TIBC, IRON, RETICCTPCT in the last 72 hours. Urinalysis No results found for: COLORURINE, APPEARANCEUR, LABSPEC, [place], GLUCOSEU, HGBUR, BILIRUBINUR, KETONESUR, PROTEINUR, UROBILINOGEN, NITRITE, LEUKOCYTESUR Sepsis Labs Invalid input(s): PROCALCITONIN,  WBC,  LACTICIDVEN Microbiology No results found for this or any previous visit (from the past 240 hour(s)).   Time coordinating discharge: Over 30 minutes  SIGNED:   Nicolette Bang, MD  [facility] 06/29/2018, 11:18 AM Pager   If 7PM-7AM, please contact night-coverage [email or web address] Password [email or web address]

## 2018-07-01 ENCOUNTER — Telehealth: Payer: Self-pay | Admitting: Pulmonary Disease

## 2018-07-01 ENCOUNTER — Emergency Department (HOSPITAL_COMMUNITY): Payer: Medicare Other

## 2018-07-01 ENCOUNTER — Encounter (HOSPITAL_COMMUNITY): Payer: Self-pay | Admitting: Emergency Medicine

## 2018-07-01 ENCOUNTER — Inpatient Hospital Stay (HOSPITAL_COMMUNITY)
Admission: EM | Admit: 2018-07-01 | Discharge: 2018-07-19 | DRG: 870 | Disposition: E | Payer: Medicare Other | Attending: Pulmonary Disease | Admitting: Pulmonary Disease

## 2018-07-01 DIAGNOSIS — A419 Sepsis, unspecified organism: Secondary | ICD-10-CM | POA: Diagnosis present

## 2018-07-01 DIAGNOSIS — N179 Acute kidney failure, unspecified: Secondary | ICD-10-CM | POA: Diagnosis not present

## 2018-07-01 DIAGNOSIS — R918 Other nonspecific abnormal finding of lung field: Secondary | ICD-10-CM | POA: Diagnosis present

## 2018-07-01 DIAGNOSIS — I10 Essential (primary) hypertension: Secondary | ICD-10-CM | POA: Diagnosis present

## 2018-07-01 DIAGNOSIS — T451X5A Adverse effect of antineoplastic and immunosuppressive drugs, initial encounter: Secondary | ICD-10-CM | POA: Diagnosis present

## 2018-07-01 DIAGNOSIS — Z9981 Dependence on supplemental oxygen: Secondary | ICD-10-CM

## 2018-07-01 DIAGNOSIS — Z23 Encounter for immunization: Secondary | ICD-10-CM | POA: Diagnosis present

## 2018-07-01 DIAGNOSIS — Z79899 Other long term (current) drug therapy: Secondary | ICD-10-CM

## 2018-07-01 DIAGNOSIS — E1165 Type 2 diabetes mellitus with hyperglycemia: Secondary | ICD-10-CM | POA: Diagnosis present

## 2018-07-01 DIAGNOSIS — Z515 Encounter for palliative care: Secondary | ICD-10-CM | POA: Diagnosis present

## 2018-07-01 DIAGNOSIS — E1122 Type 2 diabetes mellitus with diabetic chronic kidney disease: Secondary | ICD-10-CM | POA: Diagnosis present

## 2018-07-01 DIAGNOSIS — Z7982 Long term (current) use of aspirin: Secondary | ICD-10-CM

## 2018-07-01 DIAGNOSIS — J9621 Acute and chronic respiratory failure with hypoxia: Secondary | ICD-10-CM | POA: Diagnosis not present

## 2018-07-01 DIAGNOSIS — T380X5A Adverse effect of glucocorticoids and synthetic analogues, initial encounter: Secondary | ICD-10-CM | POA: Diagnosis not present

## 2018-07-01 DIAGNOSIS — Z66 Do not resuscitate: Secondary | ICD-10-CM | POA: Diagnosis not present

## 2018-07-01 DIAGNOSIS — D61818 Other pancytopenia: Secondary | ICD-10-CM | POA: Diagnosis present

## 2018-07-01 DIAGNOSIS — Z6834 Body mass index (BMI) 34.0-34.9, adult: Secondary | ICD-10-CM

## 2018-07-01 DIAGNOSIS — J80 Acute respiratory distress syndrome: Secondary | ICD-10-CM

## 2018-07-01 DIAGNOSIS — D6959 Other secondary thrombocytopenia: Secondary | ICD-10-CM | POA: Diagnosis present

## 2018-07-01 DIAGNOSIS — K219 Gastro-esophageal reflux disease without esophagitis: Secondary | ICD-10-CM | POA: Diagnosis present

## 2018-07-01 DIAGNOSIS — Z86711 Personal history of pulmonary embolism: Secondary | ICD-10-CM

## 2018-07-01 DIAGNOSIS — H547 Unspecified visual loss: Secondary | ICD-10-CM | POA: Diagnosis present

## 2018-07-01 DIAGNOSIS — Z0189 Encounter for other specified special examinations: Secondary | ICD-10-CM

## 2018-07-01 DIAGNOSIS — Z01818 Encounter for other preprocedural examination: Secondary | ICD-10-CM

## 2018-07-01 DIAGNOSIS — Z9911 Dependence on respirator [ventilator] status: Secondary | ICD-10-CM | POA: Diagnosis not present

## 2018-07-01 DIAGNOSIS — E876 Hypokalemia: Secondary | ICD-10-CM | POA: Diagnosis present

## 2018-07-01 DIAGNOSIS — I480 Paroxysmal atrial fibrillation: Secondary | ICD-10-CM | POA: Diagnosis present

## 2018-07-01 DIAGNOSIS — L659 Nonscarring hair loss, unspecified: Secondary | ICD-10-CM | POA: Diagnosis present

## 2018-07-01 DIAGNOSIS — Z7901 Long term (current) use of anticoagulants: Secondary | ICD-10-CM

## 2018-07-01 DIAGNOSIS — R0902 Hypoxemia: Secondary | ICD-10-CM

## 2018-07-01 DIAGNOSIS — I129 Hypertensive chronic kidney disease with stage 1 through stage 4 chronic kidney disease, or unspecified chronic kidney disease: Secondary | ICD-10-CM | POA: Diagnosis present

## 2018-07-01 DIAGNOSIS — E872 Acidosis: Secondary | ICD-10-CM | POA: Diagnosis present

## 2018-07-01 DIAGNOSIS — J189 Pneumonia, unspecified organism: Secondary | ICD-10-CM | POA: Diagnosis present

## 2018-07-01 DIAGNOSIS — R0689 Other abnormalities of breathing: Secondary | ICD-10-CM | POA: Diagnosis not present

## 2018-07-01 DIAGNOSIS — Z923 Personal history of irradiation: Secondary | ICD-10-CM

## 2018-07-01 DIAGNOSIS — N183 Chronic kidney disease, stage 3 (moderate): Secondary | ICD-10-CM | POA: Diagnosis present

## 2018-07-01 DIAGNOSIS — Z8249 Family history of ischemic heart disease and other diseases of the circulatory system: Secondary | ICD-10-CM

## 2018-07-01 DIAGNOSIS — J9691 Respiratory failure, unspecified with hypoxia: Secondary | ICD-10-CM

## 2018-07-01 DIAGNOSIS — Y95 Nosocomial condition: Secondary | ICD-10-CM | POA: Diagnosis present

## 2018-07-01 DIAGNOSIS — J9601 Acute respiratory failure with hypoxia: Secondary | ICD-10-CM

## 2018-07-01 DIAGNOSIS — E785 Hyperlipidemia, unspecified: Secondary | ICD-10-CM | POA: Diagnosis present

## 2018-07-01 DIAGNOSIS — E8770 Fluid overload, unspecified: Secondary | ICD-10-CM | POA: Diagnosis not present

## 2018-07-01 DIAGNOSIS — I2699 Other pulmonary embolism without acute cor pulmonale: Secondary | ICD-10-CM

## 2018-07-01 DIAGNOSIS — J9690 Respiratory failure, unspecified, unspecified whether with hypoxia or hypercapnia: Secondary | ICD-10-CM

## 2018-07-01 DIAGNOSIS — C801 Malignant (primary) neoplasm, unspecified: Secondary | ICD-10-CM | POA: Diagnosis not present

## 2018-07-01 DIAGNOSIS — E669 Obesity, unspecified: Secondary | ICD-10-CM | POA: Diagnosis present

## 2018-07-01 DIAGNOSIS — R590 Localized enlarged lymph nodes: Secondary | ICD-10-CM | POA: Diagnosis present

## 2018-07-01 DIAGNOSIS — Z85118 Personal history of other malignant neoplasm of bronchus and lung: Secondary | ICD-10-CM

## 2018-07-01 DIAGNOSIS — Z978 Presence of other specified devices: Secondary | ICD-10-CM

## 2018-07-01 DIAGNOSIS — R0602 Shortness of breath: Secondary | ICD-10-CM | POA: Diagnosis present

## 2018-07-01 DIAGNOSIS — Z87891 Personal history of nicotine dependence: Secondary | ICD-10-CM

## 2018-07-01 DIAGNOSIS — R6521 Severe sepsis with septic shock: Secondary | ICD-10-CM | POA: Diagnosis not present

## 2018-07-01 DIAGNOSIS — Z8673 Personal history of transient ischemic attack (TIA), and cerebral infarction without residual deficits: Secondary | ICD-10-CM

## 2018-07-01 DIAGNOSIS — Z8701 Personal history of pneumonia (recurrent): Secondary | ICD-10-CM

## 2018-07-01 DIAGNOSIS — Z9221 Personal history of antineoplastic chemotherapy: Secondary | ICD-10-CM

## 2018-07-01 LAB — COMPREHENSIVE METABOLIC PANEL
ALT: 13 U/L (ref 0–44)
AST: 25 U/L (ref 15–41)
Albumin: 2.5 g/dL — ABNORMAL LOW (ref 3.5–5.0)
Alkaline Phosphatase: 82 U/L (ref 38–126)
Anion gap: 15 (ref 5–15)
BILIRUBIN TOTAL: 1.3 mg/dL — AB (ref 0.3–1.2)
BUN: 17 mg/dL (ref 8–23)
CO2: 25 mmol/L (ref 22–32)
Calcium: 8.5 mg/dL — ABNORMAL LOW (ref 8.9–10.3)
Chloride: 100 mmol/L (ref 98–111)
Creatinine, Ser: 1.34 mg/dL — ABNORMAL HIGH (ref 0.44–1.00)
GFR calc Af Amer: 46 mL/min — ABNORMAL LOW (ref >60)
GFR calc non Af Amer: 39 mL/min — ABNORMAL LOW (ref >60)
GLUCOSE: 144 mg/dL — AB (ref 70–99)
Potassium: 3.7 mmol/L (ref 3.5–5.1)
Sodium: 140 mmol/L (ref 135–145)
Total Protein: 6.2 g/dL — ABNORMAL LOW (ref 6.5–8.1)

## 2018-07-01 LAB — I-STAT TROPONIN, ED: Troponin i, poc: 0.02 ng/mL (ref 0.00–0.08)

## 2018-07-01 LAB — CBC WITH DIFFERENTIAL/PLATELET
Abs Immature Granulocytes: 0.16 10*3/uL — ABNORMAL HIGH (ref 0.00–0.07)
Basophils Absolute: 0 10*3/uL (ref 0.0–0.1)
Basophils Relative: 0 %
Eosinophils Absolute: 0 10*3/uL (ref 0.0–0.5)
Eosinophils Relative: 0 %
HCT: 31.3 % — ABNORMAL LOW (ref 36.0–46.0)
HEMOGLOBIN: 9.8 g/dL — AB (ref 12.0–15.0)
Immature Granulocytes: 2 %
Lymphocytes Relative: 8 %
Lymphs Abs: 0.8 10*3/uL (ref 0.7–4.0)
MCH: 30.5 pg (ref 26.0–34.0)
MCHC: 31.3 g/dL (ref 30.0–36.0)
MCV: 97.5 fL (ref 80.0–100.0)
Monocytes Absolute: 1.2 10*3/uL — ABNORMAL HIGH (ref 0.1–1.0)
Monocytes Relative: 12 %
Neutro Abs: 7.7 10*3/uL (ref 1.7–7.7)
Neutrophils Relative %: 78 %
Platelets: 129 10*3/uL — ABNORMAL LOW (ref 150–400)
RBC: 3.21 MIL/uL — AB (ref 3.87–5.11)
RDW: 15.5 % (ref 11.5–15.5)
WBC: 9.8 10*3/uL (ref 4.0–10.5)
nRBC: 0 % (ref 0.0–0.2)

## 2018-07-01 LAB — POCT I-STAT EG7
Acid-Base Excess: 3 mmol/L — ABNORMAL HIGH (ref 0.0–2.0)
Bicarbonate: 27.1 mmol/L (ref 20.0–28.0)
Calcium, Ion: 1.12 mmol/L — ABNORMAL LOW (ref 1.15–1.40)
HCT: 24 % — ABNORMAL LOW (ref 36.0–46.0)
Hemoglobin: 8.2 g/dL — ABNORMAL LOW (ref 12.0–15.0)
O2 Saturation: 78 %
Potassium: 3.9 mmol/L (ref 3.5–5.1)
Sodium: 140 mmol/L (ref 135–145)
TCO2: 28 mmol/L (ref 22–32)
pCO2, Ven: 41 mmHg — ABNORMAL LOW (ref 44.0–60.0)
pH, Ven: 7.429 (ref 7.250–7.430)
pO2, Ven: 41 mmHg (ref 32.0–45.0)

## 2018-07-01 LAB — URINALYSIS, ROUTINE W REFLEX MICROSCOPIC
Bilirubin Urine: NEGATIVE
GLUCOSE, UA: NEGATIVE mg/dL
Hgb urine dipstick: NEGATIVE
Ketones, ur: NEGATIVE mg/dL
Leukocytes,Ua: NEGATIVE
Nitrite: NEGATIVE
PH: 6 (ref 5.0–8.0)
Protein, ur: NEGATIVE mg/dL
Specific Gravity, Urine: 1.029 (ref 1.005–1.030)

## 2018-07-01 LAB — POCT I-STAT 7, (LYTES, BLD GAS, ICA,H+H)
Acid-base deficit: 2 mmol/L (ref 0.0–2.0)
Bicarbonate: 23.1 mmol/L (ref 20.0–28.0)
Calcium, Ion: 1.13 mmol/L — ABNORMAL LOW (ref 1.15–1.40)
HCT: 30 % — ABNORMAL LOW (ref 36.0–46.0)
Hemoglobin: 10.2 g/dL — ABNORMAL LOW (ref 12.0–15.0)
O2 Saturation: 91 %
Patient temperature: 98.6
Potassium: 3.2 mmol/L — ABNORMAL LOW (ref 3.5–5.1)
Sodium: 140 mmol/L (ref 135–145)
TCO2: 24 mmol/L (ref 22–32)
pCO2 arterial: 37.9 mmHg (ref 32.0–48.0)
pH, Arterial: 7.393 (ref 7.350–7.450)
pO2, Arterial: 62 mmHg — ABNORMAL LOW (ref 83.0–108.0)

## 2018-07-01 LAB — LACTIC ACID, PLASMA
Lactic Acid, Venous: 2.2 mmol/L (ref 0.5–1.9)
Lactic Acid, Venous: 1.2 mmol/L (ref 0.5–1.9)
Lactic Acid, Venous: 0.9 mmol/L (ref 0.5–1.9)

## 2018-07-01 LAB — PROTIME-INR
INR: 3.16
Prothrombin Time: 31.9 seconds — ABNORMAL HIGH (ref 11.4–15.2)

## 2018-07-01 LAB — STREP PNEUMONIAE URINARY ANTIGEN: Strep Pneumo Urinary Antigen: NEGATIVE

## 2018-07-01 MED ORDER — PANTOPRAZOLE SODIUM 40 MG PO TBEC
40.0000 mg | DELAYED_RELEASE_TABLET | Freq: Every day | ORAL | Status: DC
  Administered 2018-07-04: 40 mg via ORAL
  Filled 2018-07-01: qty 1

## 2018-07-01 MED ORDER — SODIUM CHLORIDE 0.9 % IV BOLUS
1000.0000 mL | Freq: Once | INTRAVENOUS | Status: AC
  Administered 2018-07-01: 1000 mL via INTRAVENOUS

## 2018-07-01 MED ORDER — ENOXAPARIN SODIUM 80 MG/0.8ML ~~LOC~~ SOLN
80.0000 mg | Freq: Two times a day (BID) | SUBCUTANEOUS | Status: DC
  Filled 2018-07-01: qty 0.8

## 2018-07-01 MED ORDER — ATORVASTATIN CALCIUM 10 MG PO TABS
20.0000 mg | ORAL_TABLET | Freq: Every day | ORAL | Status: DC
  Administered 2018-07-04: 20 mg via ORAL
  Filled 2018-07-01: qty 2

## 2018-07-01 MED ORDER — VANCOMYCIN HCL IN DEXTROSE 1-5 GM/200ML-% IV SOLN: 1000.0000 mg | INTRAVENOUS | Status: DC

## 2018-07-01 MED ORDER — SODIUM CHLORIDE 0.9 % IV SOLN
2.0000 g | INTRAVENOUS | Status: DC
  Administered 2018-07-02: 2 g via INTRAVENOUS
  Filled 2018-07-01 (×2): qty 2

## 2018-07-01 MED ORDER — IPRATROPIUM-ALBUTEROL 0.5-2.5 (3) MG/3ML IN SOLN: 3.0000 mL | Freq: Four times a day (QID) | RESPIRATORY_TRACT | Status: DC | PRN

## 2018-07-01 MED ORDER — ASPIRIN EC 81 MG PO TBEC
81.0000 mg | DELAYED_RELEASE_TABLET | Freq: Every day | ORAL | Status: DC
  Administered 2018-07-04 – 2018-07-10 (×7): 81 mg via ORAL
  Filled 2018-07-01 (×8): qty 1

## 2018-07-01 MED ORDER — SODIUM CHLORIDE 0.9 % IV SOLN
2.0000 g | Freq: Once | INTRAVENOUS | Status: AC
  Administered 2018-07-01: 2 g via INTRAVENOUS
  Filled 2018-07-01: qty 2

## 2018-07-01 MED ORDER — SODIUM CHLORIDE 0.9 % IV SOLN
INTRAVENOUS | Status: DC
  Administered 2018-07-02 – 2018-07-04 (×8): via INTRAVENOUS
  Administered 2018-07-05: 500 mL via INTRAVENOUS
  Administered 2018-07-09: 04:00:00 via INTRAVENOUS

## 2018-07-01 MED ORDER — IOPAMIDOL (ISOVUE-370) INJECTION 76%
100.0000 mL | Freq: Once | INTRAVENOUS | Status: AC | PRN
  Administered 2018-07-01: 100 mL via INTRAVENOUS

## 2018-07-01 MED ORDER — ENOXAPARIN SODIUM 80 MG/0.8ML ~~LOC~~ SOLN
80.0000 mg | Freq: Two times a day (BID) | SUBCUTANEOUS | Status: DC
  Administered 2018-07-02 – 2018-07-06 (×9): 80 mg via SUBCUTANEOUS
  Filled 2018-07-01 (×9): qty 0.8

## 2018-07-01 MED ORDER — IOPAMIDOL (ISOVUE-370) INJECTION 76%
INTRAVENOUS | Status: AC
  Filled 2018-07-01: qty 100

## 2018-07-01 MED ORDER — VANCOMYCIN HCL 10 G IV SOLR
1500.0000 mg | Freq: Once | INTRAVENOUS | Status: AC
  Administered 2018-07-01: 1500 mg via INTRAVENOUS
  Filled 2018-07-01: qty 1500

## 2018-07-01 MED ORDER — VANCOMYCIN HCL IN DEXTROSE 1-5 GM/200ML-% IV SOLN: 1000.0000 mg | Freq: Once | INTRAVENOUS | Status: DC

## 2018-07-01 NOTE — Progress Notes (Signed)
Pharmacy Antibiotic Note  Michele [name] is a 73 y.o. female admitted on 07/13/2018 with pneumonia.  Pharmacy has been consulted for vancomycin and cefepime dosing.  Given one time doses in the ED.  Plan: Start cefepime 2g IV Q24h Start vancomycin 1g IV Q36h Monitor clinical picture, renal function, vanc levels prn F/U C&S, abx deescalation / LOT     Temp (24hrs), Avg:98.3 F (36.8 C), Min:98.3 F (36.8 C), Max:98.3 F (36.8 C)  Recent Labs  Lab 06/26/18 1057 06/26/18 1139 06/28/18 0635 07/04/2018 1630 07/13/2018 1850  WBC  --  7.5 5.6 9.8  --   CREATININE 1.10* 1.11*  --  1.34*  --   LATICACIDVEN  --   --   --  2.2* 1.2    Estimated Creatinine Clearance: 39.4 mL/min (A) (by C-G formula based on SCr of 1.34 mg/dL (H)).    No Known Allergies   Thank you for allowing pharmacy to be a part of this patient's care.  Reginia Naas 06/22/2018 8:31 PM

## 2018-07-01 NOTE — ED Notes (Signed)
Pt consistently satting 85% on 15LPM hi-flo, does not appear in any worsening resp distress. RT consulted, per RRT place pt on NRB. Pt on NRB at this time, sats 93

## 2018-07-01 NOTE — ED Triage Notes (Addendum)
PT reports SOB for several weeks. Discharge 2 days ago on oxygen 3 L [place]. Patient sats on 3 L [place] was 67%. He raised it to 5 l [place]. Then increased to 8 L [place]. She is 96% on 8 L [place] currently. They need her to get to rehab center. Husband was concerned and brought her in. Pt is in 8 L with high flow cannula per respiratory at this time. She reports she does not feel well.

## 2018-07-01 NOTE — H&P (Addendum)
History and Physical    Michele [name] KDX:[id number] DOB: 04-Jul-1945 DOA: 07/07/2018  PCP: Ronita Hipps, MD Patient coming from: Home  Chief Complaint: Shortness of breath  HPI: Michele [name] is a 73 y.o. female with medical history significant of hypertension, hyperlipidemia, lung cancer status post chemo and radiation (finished chemo in November 2019), thrombocytopenia, recent admission to [facility] for pneumonia and then subsequent stroke presenting to the hospital for evaluation of shortness of breath.  History provided by husband at bedside.  States patient was discharged from the hospital 2 days ago after stroke and since then has been feeling very short of breath.  She has been coughing.  No fevers or chills.  Patient denies having any chest pain.  No calf pain, swelling, or erythema.  No prior history of blood clots.  She uses 3 L oxygen at home.  He checked her oxygen saturation at home this morning and it was 66% on 3 L, he then increased the oxygen to 8 L.  Husband states patient was admitted to [facility] in January for pneumonia and was discharged home with a 2-week course of Levaquin at that time.  Review of Systems: As per HPI otherwise 10 point review of systems negative.  Past Medical History:  Diagnosis Date  . A-fib ([place])   . Acute kidney failure, unspecified ([place])   . Cancer ([place])    lung  . CKD (chronic kidney disease), stage III ([place])   . CVA (cerebral vascular accident) ([place])   . Hyperlipidemia   . Hypertension   . Hyponatremia   . Lung cancer ([place])   . Normocytic anemia   . Stroke ([place])   . Thrombocytopenia, unspecified ([place])   . Vision loss     Past Surgical History:  Procedure Laterality Date  . ENDOBRONCHIAL ULTRASOUND Bilateral 01/08/2018   Procedure: ENDOBRONCHIAL ULTRASOUND;  Surgeon: Garner Nash, DO;  Location: [facility];  Service: Cardiopulmonary;  Laterality: Bilateral;  . FINE NEEDLE ASPIRATION  01/08/2018   Procedure: FINE NEEDLE  ASPIRATION (FNA) LINEAR;  Surgeon: Garner Nash, DO;  Location: [facility];  Service: Cardiopulmonary;;  . FLEXIBLE BRONCHOSCOPY  01/08/2018   Procedure: FLEXIBLE BRONCHOSCOPY;  Surgeon: Garner Nash, DO;  Location: [facility];  Service: Cardiopulmonary;;  . TUBAL LIGATION       reports that she quit smoking about 10 years ago. Her smoking use included cigarettes. She has a 25.00 pack-year smoking history. She has never used smokeless tobacco. She reports that she does not drink alcohol or use drugs.  No Known Allergies  Family History  Problem Relation Age of Onset  . Dementia Mother   . Breast cancer Mother   . Heart attack Father     Prior to Admission medications   Medication Sig Start Date End Date Taking? Authorizing Provider  acetaminophen (TYLENOL) 325 MG tablet Take 650 mg by mouth every 6 (six) hours as needed (for fever and/or pain).    Yes [provider]  amLODipine (NORVASC) 5 MG tablet Take 5 mg by mouth daily.   Yes [provider]  aspirin EC 81 MG tablet Take 81 mg by mouth daily.   Yes [provider]  atorvastatin (LIPITOR) 20 MG tablet Take 20 mg by mouth daily. 11/19/17  Yes [provider]  carvedilol (COREG) 12.5 MG tablet Take 12.5 mg by mouth 2 (two) times daily with a meal.   Yes [provider]  ibuprofen (ADVIL,MOTRIN) 200 MG tablet Take 400 mg  by mouth every 6 (six) hours as needed for headache or moderate pain.   Yes [provider]  ipratropium-albuterol (DUONEB) 0.5-2.5 (3) MG/3ML SOLN Take 3 mLs by nebulization daily as needed (for wheezing).   Yes [provider]  Magnesium Oxide 400 (240 Mg) MG TABS Take 400 mg by mouth 2 (two) times daily.   Yes [provider]  omeprazole (PRILOSEC) 20 MG capsule Take 20 mg by mouth daily.   Yes [provider]  OXYGEN Inhale 3-8 L into the lungs continuous.   Yes [provider]  rivaroxaban (XARELTO) 20 MG TABS tablet  Take 1 tablet (20 mg total) by mouth daily with supper. Patient taking differently: Take 20 mg by mouth daily.  06/29/18 09/27/18 Yes Spongberg, Audie Pinto, MD    Physical Exam: Vitals:   07/10/2018 1915 06/25/2018 1945 07/13/2018 2000 06/25/2018 2015  BP: 114/66 (!) 111/53 112/65 (!) 110/53  Pulse: 91 90 91 89  Resp: (!) 32 (!) 31 (!) 32 (!) 29  Temp:      TempSrc:      SpO2: 92% 92% 92% 91%    Physical Exam  Constitutional: She is oriented to person, place, and time. She appears well-developed and well-nourished. No distress.  HENT:  Head: Normocephalic.  Mouth/Throat: Oropharynx is clear and moist.  Eyes: Right eye exhibits no discharge. Left eye exhibits no discharge.  Neck: Neck supple.  Cardiovascular: Normal rate, regular rhythm and intact distal pulses.  Pulmonary/Chest: She has no wheezes. She has rales.  Reduced breath sounds and rales bilaterally  Abdominal: Soft. Bowel sounds are normal. She exhibits no distension. There is no abdominal tenderness. There is no guarding.  Musculoskeletal:        General: No edema.  Neurological: She is alert and oriented to person, place, and time.  Skin: Skin is warm and dry. She is not diaphoretic.     Labs on Admission: I have personally reviewed following labs and imaging studies  CBC: Recent Labs  Lab 06/26/18 1139 06/28/18 0635 07/12/2018 1630 06/29/2018 1857 07/09/2018 2003  WBC 7.5 5.6 9.8  --   --   NEUTROABS 5.8 4.1 7.7  --   --   HGB 10.8* 9.3* 9.8* 8.2* 10.2*  HCT 34.6* 29.5* 31.3* 24.0* 30.0*  MCV 97.5 95.5 97.5  --   --   PLT 88* 96* 129*  --   --    Basic Metabolic Panel: Recent Labs  Lab 06/26/18 1057 06/26/18 1139 07/13/2018 1630 07/15/2018 1857 06/30/2018 2003  NA  --  140 140 140 140  K  --  4.0 3.7 3.9 3.2*  CL  --  105 100  --   --   CO2  --  25 25  --   --   GLUCOSE  --  113* 144*  --   --   BUN  --  11 17  --   --   CREATININE 1.10* 1.11* 1.34*  --   --   CALCIUM  --  8.5* 8.5*  --   --     GFR: Estimated Creatinine Clearance: 39.4 mL/min (A) (by C-G formula based on SCr of 1.34 mg/dL (H)). Liver Function Tests: Recent Labs  Lab 06/26/18 1139 06/25/2018 1630  AST 16 25  ALT 12 13  ALKPHOS 64 82  BILITOT 0.7 1.3*  PROT 5.8* 6.2*  ALBUMIN 2.3* 2.5*   No results for input(s): LIPASE, AMYLASE in the last 168 hours. No results for input(s): AMMONIA in  the last 168 hours. Coagulation Profile: Recent Labs  Lab 06/26/18 1139 06/20/2018 1630  INR 1.14 3.16   Cardiac Enzymes: No results for input(s): CKTOTAL, CKMB, CKMBINDEX, TROPONINI in the last 168 hours. BNP (last 3 results) No results for input(s): PROBNP in the last 8760 hours. HbA1C: No results for input(s): HGBA1C in the last 72 hours. CBG: No results for input(s): GLUCAP in the last 168 hours. Lipid Profile: No results for input(s): CHOL, HDL, LDLCALC, TRIG, CHOLHDL, LDLDIRECT in the last 72 hours. Thyroid Function Tests: No results for input(s): TSH, T4TOTAL, FREET4, T3FREE, THYROIDAB in the last 72 hours. Anemia Panel: No results for input(s): VITAMINB12, FOLATE, FERRITIN, TIBC, IRON, RETICCTPCT in the last 72 hours. Urine analysis: No results found for: COLORURINE, APPEARANCEUR, LABSPEC, PHURINE, GLUCOSEU, HGBUR, BILIRUBINUR, KETONESUR, PROTEINUR, UROBILINOGEN, NITRITE, LEUKOCYTESUR  Radiological Exams on Admission: Ct Angio Chest Pe W Or Wo Contrast  Result Date: 06/29/2018 CLINICAL DATA:  73 year old female with shortness of breath EXAM: CT ANGIOGRAPHY CHEST WITH CONTRAST TECHNIQUE: Multidetector CT imaging of the chest was performed using the standard protocol during bolus administration of intravenous contrast. Multiplanar CT image reconstructions and MIPs were obtained to evaluate the vascular anatomy. CONTRAST:  124m ISOVUE-370 IOPAMIDOL (ISOVUE-370) INJECTION 76% COMPARISON:  Multiple priors including 06/08/2018, 06/01/2018 05/06/2018 FINDINGS: Cardiovascular: Heart: No cardiomegaly. No pericardial  fluid/thickening. Calcifications of left main, left anterior descending, right coronary artery. Right IJ port catheter. Aorta: Unremarkable course caliber and contour of the thoracic aorta. Calcifications of the aortic arch and descending thoracic aorta. No periaortic fluid. No dissection Pulmonary arteries: There is a single filling defect within the subsegmental vessels of the right lower lobe. No other filling defects identified. Mediastinum/Nodes: Unremarkable appearance of the thoracic inlet. Multiple lymph nodes of the mediastinum throughout all nodal stations, increasing size comparison CT. Unremarkable course of the thoracic esophagus. Lungs/Pleura: Mixed ground-glass and confluent airspace opacity the bilateral upper lobes, the right middle lobe, bilateral lower lobes. In the lower lobes, there is more dependent distribution. Bilateral small pleural effusions. The appearance has significantly worsened from the most recent CT of 06/08/2018, and is new from the comparison CT of December. Background of reticulonodular changes. Redemonstration of soft tissue left hilar region in this patient with known carcinoma. Upper Abdomen: No acute. Musculoskeletal: No acute displaced fracture. Degenerative changes of the spine. Review of the MIP images confirms the above findings. . IMPRESSION: Multifocal pneumonia of all lobes, which has significantly progressed from the CT of 06/08/2018. Bilateral small pleural effusions. Infection accounts for the mediastinal adenopathy, which is most likely reactive. The CT is positive for a single pulmonary embolus involving subsegmental vessel right lower lobe. Soft tissue in the left hilar region is compatible with the patient's known history of lung carcinoma. Aortic atherosclerosis with left main and 2 vessel coronary artery disease. Electronically Signed   By: JCorrie MckusickD.O.   On: 07/05/2018 18:23   Dg Chest Port 1 View  Result Date: 07/04/2018 CLINICAL DATA:  Shortness  of breath for several weeks, worsening this morning, wet cough, history lung cancer, atrial fibrillation, stroke, hypertension, former smoker EXAM: PORTABLE CHEST 1 VIEW COMPARISON:  Portable exam 1606 hours compared to 06/11/2018 FINDINGS: RIGHT subclavian Port-A-Cath with tip projecting over SVC. Normal heart size and mediastinal contours. Diffuse pulmonary infiltrates bilaterally significantly increased from previous exam. No pleural effusion or pneumothorax. Bones demineralized. IMPRESSION: Significant increase in diffuse BILATERAL pulmonary infiltrates since 06/11/2018. Electronically Signed   By: MLavonia DanaM.D.   On: 06/22/2018 16:34  EKG: Independently reviewed.  Normal sinus rhythm, baseline wander.  Assessment/Plan Principal Problem:   Acute on chronic respiratory failure with hypoxia (HCC) Active Problems:   Hyperlipidemia   Sepsis ([place])   Multifocal pneumonia   Pulmonary embolism (HCC)   Acute on chronic hypoxic respiratory failure secondary to multifocal pneumonia and PE -Oxygen saturation in the 60s on 3 L home oxygen.  In the ED, oxygen saturation 79% on 6 L.  Patient is currently on 15 L oxygen via high flow nasal cannula, however, appears comfortable with no significant increase in work of breathing. -CTA with evidence of multifocal pneumonia which has significantly progressed since January.  Also showing bilateral small pleural effusions.  There is also evidence of a single PE involving a subsegmental vessel in the right lower lobe. -Failed Levaquin treatment.  Give broad-spectrum antibiotics (vancomycin and cefepime) -Started on Xarelto for paroxysmal A. fib by neurology on 2/8 for recurrent cardioembolic strokes.  Will discontinue Xarelto and instead start the patient on Lovenox for anticoagulation due to history of malignancy. -Lower extremity Dopplers to look for possible DVT -ABG -Continue supplemental oxygen, BiPAP if needed  -Respiratory viral panel -Strep pneumo  urinary antigen  Sepsis secondary to multifocal pneumonia -Blood pressure soft.  No leukocytosis.  Lactic acid 2.2. -Continue broad-spectrum antibiotics -IV fluid resuscitation -Continue to trend lactate -UA, urine culture pending -Blood culture x2 pending  Lung cancer status post chemo and radiation Finished chemo in November 2019. -Outpatient oncology follow-up  Pancytopenia In the setting of lung cancer and prior chemo.  White count, hemoglobin, platelet count currently stable compared to prior labs.  Elevated T bili T bili mildly elevated at 1.3, remainder of LFTs normal.  Abdominal exam benign. -Continue to monitor LFTs.  If no improvement, patient will need right upper quadrant ultrasound for further evaluation.  Hyperlipidemia -Continue statin  Paroxysmal atrial fibrillation Patient was started on Xarelto by neurology on 2/8 for recurrent cardioembolic strokes. -Will discontinue Xarelto and instead start the patient on Lovenox for anticoagulation due to history of malignancy.  GERD -Continue PPI  DVT prophylaxis: Lovenox Code Status: Full code.  Discussed with patient and family at bedside. Family Communication: Husband and daughter at bedside. Disposition Plan: Anticipate discharge after clinical improvement. Consults called: None Admission status: It is my clinical opinion that admission to INPATIENT is reasonable and necessary in this 73 y.o. female . presenting with symptoms of respiratory distress and hypoxia, concerning for acute hypoxic respiratory failure secondary to focal pneumonia and PE; sepsis secondary to multifocal pneumonia . in the context of PMH including: Pneumonia . with pertinent positives on physical exam including: Supplemental oxygen requirement . and pertinent positives on radiographic and laboratory data including: Imaging with evidence of multifocal pneumonia and PE . Workup and treatment include IV broad-spectrum antibiotics, IV fluid  resuscitation, IV heparin, supplemental oxygen.  Given the aforementioned, the predictability of an adverse outcome is felt to be significant. I expect that the patient will require at least 2 midnights in the hospital to treat this condition.    Shela Leff MD [facility] Pager [phone]  If 7PM-7AM, please contact night-coverage [email or web address] Password [facility]  07/15/2018, 8:24 PM

## 2018-07-01 NOTE — ED Provider Notes (Signed)
[facility] Emergency Department Provider Note MRN:  [id number]  Arrival date & time: 06/27/2018     Chief Complaint   Shortness of Breath   History of Present Illness   Michele [name] is a 73 y.o. year-old female with a history of lung cancer, stroke presenting to the ED with chief complaint of shortness of breath.  Increased persistent cough for the past 2 nights.  This morning was hypoxic, more short of breath, found to be 67% pulse ox on her 3 L nasal cannula.  Increased to 8 L to obtain sats in the 90s.  Patient denies chest pain, feels more tired than normal, family notes that she is more confused than normal.  Family denies recent fever.  I was unable to obtain an accurate HPI, PMH, or ROS due to the patient's altered mental status.  Review of Systems  A complete 10 system review of systems was obtained and all systems are negative except as noted in the HPI and PMH.   Patient's Health History    Past Medical History:  Diagnosis Date  . A-fib ([place])   . Acute kidney failure, unspecified ([place])   . Cancer ([place])    lung  . CKD (chronic kidney disease), stage III ([place])   . CVA (cerebral vascular accident) ([place])   . Hyperlipidemia   . Hypertension   . Hyponatremia   . Lung cancer ([place])   . Normocytic anemia   . Stroke ([place])   . Thrombocytopenia, unspecified ([place])   . Vision loss     Past Surgical History:  Procedure Laterality Date  . ENDOBRONCHIAL ULTRASOUND Bilateral 01/08/2018   Procedure: ENDOBRONCHIAL ULTRASOUND;  Surgeon: Garner Nash, DO;  [facility];  Service: Cardiopulmonary;  Laterality: Bilateral;  . FINE NEEDLE ASPIRATION  01/08/2018   Procedure: FINE NEEDLE ASPIRATION (FNA) LINEAR;  Surgeon: Garner Nash, DO;  [facility];  Service: Cardiopulmonary;;  . FLEXIBLE BRONCHOSCOPY  01/08/2018   Procedure: FLEXIBLE BRONCHOSCOPY;  Surgeon: Garner Nash, DO;  [facility];  Service: Cardiopulmonary;;  . TUBAL  LIGATION      Family History  Problem Relation Age of Onset  . Dementia Mother   . Breast cancer Mother   . Heart attack Father     Social History   Socioeconomic History  . Marital status: Married    Spouse name: Not on file  . Number of children: Not on file  . Years of education: Not on file  . Highest education level: Not on file  Occupational History  . Not on file  Social Needs  . Financial resource strain: Not on file  . Food insecurity:    Worry: Not on file    Inability: Not on file  . Transportation needs:    Medical: Not on file    Non-medical: Not on file  Tobacco Use  . Smoking status: Former Smoker    Packs/day: 1.00    Years: 25.00    Pack years: 25.00    Types: Cigarettes    Last attempt to quit: 05/20/2008    Years since quitting: 10.1  . Smokeless tobacco: Never Used  Substance and Sexual Activity  . Alcohol use: No  . Drug use: No  . Sexual activity: Not on file  Lifestyle  . Physical activity:    Days per week: Not on file    Minutes per session: Not on file  . Stress: Not on file  Relationships  . Social connections:  Talks on phone: Not on file    Gets together: Not on file    Attends religious service: Not on file    Active member of club or organization: Not on file    Attends meetings of clubs or organizations: Not on file    Relationship status: Not on file  . Intimate partner violence:    Fear of current or ex partner: Not on file    Emotionally abused: Not on file    Physically abused: Not on file    Forced sexual activity: Not on file  Other Topics Concern  . Not on file  Social History Narrative  . Not on file     Physical Exam  Vital Signs and Nursing Notes reviewed Vitals:   07/04/2018 1615 07/11/2018 1800  BP: (!) 105/59 (!) 95/56  Pulse: 90 89  Resp: (!) 21 (!) 27  Temp:    SpO2: (!) 84% 93%    CONSTITUTIONAL: Chronically ill-appearing, NAD NEURO:  Alert and oriented x 3, no focal deficits, eyes closed, open to  voice EYES:  eyes equal and reactive ENT/NECK:  no LAD, no JVD CARDIO: Regular rate, well-perfused, normal S1 and S2 PULM:  CTAB no wheezing or rhonchi GI/GU:  normal bowel sounds, non-distended, non-tender MSK/SPINE:  No gross deformities, no edema SKIN:  no rash, atraumatic PSYCH:  Appropriate speech and behavior  Diagnostic and Interventional Summary    EKG Interpretation  Date/Time:  Wednesday July 01 2018 14:54:23 EST Ventricular Rate:  87 PR Interval:  140 QRS Duration: 68 QT Interval:  378 QTC Calculation: 454 R Axis:   40 Text Interpretation:  Normal sinus rhythm Normal ECG Confirmed by Gerlene Fee [phone]) on 06/29/2018 4:04:26 PM      Labs Reviewed  COMPREHENSIVE METABOLIC PANEL - Abnormal; Notable for the following components:      Result Value   Glucose, Bld 144 (*)    Creatinine, Ser 1.34 (*)    Calcium 8.5 (*)    Total Protein 6.2 (*)    Albumin 2.5 (*)    Total Bilirubin 1.3 (*)    GFR calc non Af Amer 39 (*)    GFR calc Af Amer 46 (*)    All other components within normal limits  LACTIC ACID, PLASMA - Abnormal; Notable for the following components:   Lactic Acid, Venous 2.2 (*)    All other components within normal limits  CBC WITH DIFFERENTIAL/PLATELET - Abnormal; Notable for the following components:   RBC 3.21 (*)    Hemoglobin 9.8 (*)    HCT 31.3 (*)    Platelets 129 (*)    Monocytes Absolute 1.2 (*)    Abs Immature Granulocytes 0.16 (*)    All other components within normal limits  PROTIME-INR - Abnormal; Notable for the following components:   Prothrombin Time 31.9 (*)    All other components within normal limits  POCT I-STAT EG7 - Abnormal; Notable for the following components:   pCO2, Ven 41.0 (*)    Acid-Base Excess 3.0 (*)    Calcium, Ion 1.12 (*)    HCT 24.0 (*)    Hemoglobin 8.2 (*)    All other components within normal limits  CULTURE, BLOOD (ROUTINE X 2)  CULTURE, BLOOD (ROUTINE X 2)  LACTIC ACID, PLASMA  URINALYSIS,  ROUTINE W REFLEX MICROSCOPIC  BRAIN NATRIURETIC PEPTIDE  I-STAT TROPONIN, ED    CT ANGIO CHEST PE W OR WO CONTRAST  Final Result    DG Chest Port 1 [address]  Final Result      Medications  vancomycin (VANCOCIN) 1,500 mg in sodium chloride 0.9 % 500 mL IVPB (1,500 mg Intravenous New Bag/Given 06/20/2018 1813)  iopamidol (ISOVUE-370) 76 % injection (has no administration in time range)  sodium chloride 0.9 % bolus 1,000 mL (1,000 mLs Intravenous New Bag/Given 07/10/2018 1911)  sodium chloride 0.9 % bolus 1,000 mL (0 mLs Intravenous Stopped 07/13/2018 1744)  ceFEPIme (MAXIPIME) 2 g in sodium chloride 0.9 % 100 mL IVPB (0 g Intravenous Stopped 06/21/2018 1712)  iopamidol (ISOVUE-370) 76 % injection 100 mL (100 mLs Intravenous Contrast Given 06/29/2018 1727)     Procedures Critical Care Critical Care Documentation Critical care time provided by me (excluding procedures): 41 minutes  Condition necessitating critical care: Hypoxic respiratory failure, concern for sepsis, acute pulmonary embolism  Components of critical care management: reviewing of prior records, laboratory and imaging interpretation, frequent re-examination and reassessment of vital signs, administration of IV fluids, IV antibiotics, IV heparin, high flow nasal cannula, discussion with consulting services   ED Course and Medical Decision Making  I have reviewed the triage vital signs and the nursing notes.  Pertinent labs & imaging results that were available during my care of the patient were reviewed by me and considered in my medical decision making (see below for details).  Concern for worsening pneumonia, increased tumor burden, pneumothorax, pulmonary embolism in this 73 year old female with hypoxic respiratory failure.  Respiratory therapy consulted for high flow nasal cannula, chest x-ray, labs pending.  Will need CTA of the chest.  X-ray and CT of the chest concerning for worsening pneumonia.  Patient was code sepsis alerted and  started on Vanco and cefepime.  CT also revealed small pulmonary embolism, heparin initiated.  Admitted to stepdown unit for further care.  Barth Kirks. Sedonia Small, [place] [email or web address]  Final Clinical Impressions(s) / ED Diagnoses     ICD-10-CM   1. Acute respiratory failure with hypoxia (HCC) J96.01   2. Hypoxia R09.02 DG Chest [facility] 1 View    DG Chest Port 1 View  3. Sepsis, due to unspecified organism, unspecified whether acute organ dysfunction present ([place]) A41.9   4. Acute pulmonary embolism, unspecified pulmonary embolism type, unspecified whether acute cor pulmonale present [facility]) I26.99     ED Discharge Orders    None         Maudie Flakes, MD 06/24/2018 1930

## 2018-07-01 NOTE — Progress Notes (Signed)
ANTICOAGULATION CONSULT NOTE - Follow Up Consult  Pharmacy Consult for enoxaparin Indication: pulmonary embolus  No Known Allergies  Patient Measurements: TBW 82.1 kg Heparin Dosing Weight: 72.5 kg  Vital Signs: Temp: 98.3 F (36.8 C) (02/12 1456) Temp Source: Oral (02/12 1456) BP: 95/56 (02/12 1800) Pulse Rate: 89 (02/12 1800)  Labs: Recent Labs    07/14/2018 1630 07/10/2018 1857  HGB 9.8* 8.2*  HCT 31.3* 24.0*  PLT 129*  --   LABPROT 31.9*  --   INR 3.16  --   CREATININE 1.34*  --     Estimated Creatinine Clearance: 39.4 mL/min (A) (by C-G formula based on SCr of 1.34 mg/dL (H)).  Assessment: Xarelto was started just two days ago, 20mg  dailly. Unsure why this was started, no explanation in notes but did have stroke. Last dose taken today at 0830. Since treating active clot could consider starting Lovenox tonight but with poor renal function and low Hgb at 8.2 and plts 129 will plan to wait almost 24 hrs. Has lung cancer s/p chemo in 2019.  Goal of Therapy:  Anti-Xa 0.6-1.0 Monitor platelets by anticoagulation protocol: Yes   Plan:  Start enoxaparin 80mg  [place] Q12h tomorrow morning 24 hours after last Xarelto dose Monitor  CBC, s/s of bleed  Dayln Tugwell J 07/09/2018,7:28 PM

## 2018-07-01 NOTE — Telephone Encounter (Signed)
Patient family member states that the patient is being transferred to the hospital right now and now longer needs form at this time.

## 2018-07-02 ENCOUNTER — Ambulatory Visit: Payer: Medicare Other | Admitting: Pulmonary Disease

## 2018-07-02 ENCOUNTER — Other Ambulatory Visit: Payer: Self-pay

## 2018-07-02 ENCOUNTER — Encounter (HOSPITAL_COMMUNITY): Payer: Self-pay

## 2018-07-02 ENCOUNTER — Inpatient Hospital Stay (HOSPITAL_COMMUNITY): Payer: Medicare Other

## 2018-07-02 DIAGNOSIS — R0689 Other abnormalities of breathing: Secondary | ICD-10-CM

## 2018-07-02 LAB — RESPIRATORY PANEL BY PCR
Adenovirus: NOT DETECTED
BORDETELLA PERTUSSIS-RVPCR: NOT DETECTED
Chlamydophila pneumoniae: NOT DETECTED
Coronavirus 229E: NOT DETECTED
Coronavirus HKU1: NOT DETECTED
Coronavirus NL63: NOT DETECTED
Coronavirus OC43: NOT DETECTED
INFLUENZA A-RVPPCR: NOT DETECTED
Influenza B: NOT DETECTED
Metapneumovirus: NOT DETECTED
Mycoplasma pneumoniae: NOT DETECTED
Parainfluenza Virus 1: NOT DETECTED
Parainfluenza Virus 2: NOT DETECTED
Parainfluenza Virus 3: NOT DETECTED
Parainfluenza Virus 4: NOT DETECTED
Respiratory Syncytial Virus: NOT DETECTED
Rhinovirus / Enterovirus: NOT DETECTED

## 2018-07-02 LAB — HIV ANTIBODY (ROUTINE TESTING W REFLEX): HIV Screen 4th Generation wRfx: NONREACTIVE

## 2018-07-02 LAB — CBC
HCT: 28.6 % — ABNORMAL LOW (ref 36.0–46.0)
Hemoglobin: 9 g/dL — ABNORMAL LOW (ref 12.0–15.0)
MCH: 30.4 pg (ref 26.0–34.0)
MCHC: 31.5 g/dL (ref 30.0–36.0)
MCV: 96.6 fL (ref 80.0–100.0)
Platelets: 111 10*3/uL — ABNORMAL LOW (ref 150–400)
RBC: 2.96 MIL/uL — ABNORMAL LOW (ref 3.87–5.11)
RDW: 15.8 % — ABNORMAL HIGH (ref 11.5–15.5)
WBC: 9.4 10*3/uL (ref 4.0–10.5)
nRBC: 0 % (ref 0.0–0.2)

## 2018-07-02 LAB — COMPREHENSIVE METABOLIC PANEL
ALK PHOS: 93 U/L (ref 38–126)
ALT: 11 U/L (ref 0–44)
AST: 17 U/L (ref 15–41)
Albumin: 2.3 g/dL — ABNORMAL LOW (ref 3.5–5.0)
Anion gap: 10 (ref 5–15)
BILIRUBIN TOTAL: 1.5 mg/dL — AB (ref 0.3–1.2)
BUN: 12 mg/dL (ref 8–23)
CO2: 20 mmol/L — ABNORMAL LOW (ref 22–32)
Calcium: 7.6 mg/dL — ABNORMAL LOW (ref 8.9–10.3)
Chloride: 112 mmol/L — ABNORMAL HIGH (ref 98–111)
Creatinine, Ser: 0.93 mg/dL (ref 0.44–1.00)
GFR calc Af Amer: >60 mL/min (ref >60)
Glucose, Bld: 128 mg/dL — ABNORMAL HIGH (ref 70–99)
Potassium: 3.3 mmol/L — ABNORMAL LOW (ref 3.5–5.1)
Sodium: 142 mmol/L (ref 135–145)
TOTAL PROTEIN: 5.7 g/dL — AB (ref 6.5–8.1)

## 2018-07-02 LAB — POCT I-STAT 7, (LYTES, BLD GAS, ICA,H+H)
Acid-Base Excess: 1 mmol/L (ref 0.0–2.0)
BICARBONATE: 24.4 mmol/L (ref 20.0–28.0)
Calcium, Ion: 0.97 mmol/L — ABNORMAL LOW (ref 1.15–1.40)
HCT: 37 % (ref 36.0–46.0)
Hemoglobin: 12.6 g/dL (ref 12.0–15.0)
O2 Saturation: 100 %
Patient temperature: 101
Potassium: 3.4 mmol/L — ABNORMAL LOW (ref 3.5–5.1)
Sodium: 144 mmol/L (ref 135–145)
TCO2: 25 mmol/L (ref 22–32)
pCO2 arterial: 36.5 mmHg (ref 32.0–48.0)
pH, Arterial: 7.439 (ref 7.350–7.450)
pO2, Arterial: 173 mmHg — ABNORMAL HIGH (ref 83.0–108.0)

## 2018-07-02 LAB — ECHOCARDIOGRAM COMPLETE
Height: 64 in
Weight: 2885.38 oz

## 2018-07-02 LAB — MRSA PCR SCREENING: MRSA by PCR: NEGATIVE

## 2018-07-02 LAB — PROCALCITONIN: Procalcitonin: 0.39 ng/mL

## 2018-07-02 MED ORDER — PNEUMOCOCCAL VAC POLYVALENT 25 MCG/0.5ML IJ INJ: 0.5000 mL | INJECTION | INTRAMUSCULAR | Status: DC

## 2018-07-02 MED ORDER — CHLORHEXIDINE GLUCONATE 0.12 % MT SOLN: 15.0000 mL | Freq: Two times a day (BID) | OROMUCOSAL | Status: DC

## 2018-07-02 MED ORDER — SODIUM CHLORIDE 0.9% FLUSH
10.0000 mL | Freq: Two times a day (BID) | INTRAVENOUS | Status: DC
  Administered 2018-07-02: 10 mL
  Administered 2018-07-02: 20 mL
  Administered 2018-07-02 – 2018-07-15 (×17): 10 mL

## 2018-07-02 MED ORDER — VANCOMYCIN HCL 10 G IV SOLR
1250.0000 mg | INTRAVENOUS | Status: DC
  Administered 2018-07-02 – 2018-07-03 (×2): 1250 mg via INTRAVENOUS
  Filled 2018-07-02 (×3): qty 1250

## 2018-07-02 MED ORDER — ACETAMINOPHEN 650 MG RE SUPP: 650.0000 mg | Freq: Four times a day (QID) | RECTAL | Status: DC | PRN

## 2018-07-02 MED ORDER — SODIUM CHLORIDE 0.9% FLUSH: 10.0000 mL | INTRAVENOUS | Status: DC | PRN

## 2018-07-02 MED ORDER — ORAL CARE MOUTH RINSE: 15.0000 mL | Freq: Two times a day (BID) | OROMUCOSAL | Status: DC

## 2018-07-02 MED ORDER — POTASSIUM CHLORIDE CRYS ER 20 MEQ PO TBCR: 40.0000 meq | EXTENDED_RELEASE_TABLET | Freq: Once | ORAL | Status: DC

## 2018-07-02 MED ORDER — ACETAMINOPHEN 325 MG PO TABS: 650.0000 mg | ORAL_TABLET | Freq: Four times a day (QID) | ORAL | Status: DC | PRN

## 2018-07-02 MED ORDER — ACETAMINOPHEN 325 MG PO TABS
650.0000 mg | ORAL_TABLET | Freq: Four times a day (QID) | ORAL | Status: DC | PRN
  Filled 2018-07-02: qty 2

## 2018-07-02 NOTE — Plan of Care (Signed)
?  Problem: Coping: ?Goal: Level of anxiety will decrease ?Outcome: Progressing ?  ?Problem: Safety: ?Goal: Ability to remain free from injury will improve ?Outcome: Progressing ?  ?

## 2018-07-02 NOTE — Progress Notes (Signed)
Pt on BIPAP V60 continuously. Pt unable to tolerate any time off of BIPAP without desaturation occurring. Pt desats from low 90's to high 70's in a very short period of time. RT will continue to monitor.

## 2018-07-02 NOTE — ED Notes (Signed)
Per husband, PO meds need to be crushed and mixed in applesauce or pudding for administration d/t risk of aspiration.

## 2018-07-02 NOTE — Progress Notes (Addendum)
PROGRESS NOTE    Michele [name]  BDZ:[id number] DOB: 12-21-1945 DOA: 06/24/2018 PCP: Ronita Hipps, MD   Brief Narrative:  73 year old with past medical history relevant for hypertension, hyperlipidemia, paroxysmal atrial fibrillation on rivaroxaban, longstanding cancer status post chemo and radiation with recent admission to [facility] for pneumonia and subsequent CVA admitted with acute hypoxic respiratory failure due to multifocal pneumonia and found to have small pulmonary embolism.   Assessment & Plan:   Principal Problem:   Acute on chronic respiratory failure with hypoxia (HCC) Active Problems:   Hyperlipidemia   Sepsis ([place])   Multifocal pneumonia   Pulmonary embolism (HCC)   #) Sepsis and acute hypoxic respiratory failure due to pneumonia: Patient is febrile this morning.  At this time most likely etiology is predominantly from multifocal infiltrates concerning for multifocal pneumonia in the setting of an immunocompromised patient.  Suspect the pulmonary embolism has little of any contribution to her current symptoms. -Continue IV cefepime and vancomycin started 07/14/2018 -Follow-up blood cultures ordered to 05/09/2019 -Continue BiPAP, wean as tolerated -Continue PRN short-acting bronchodilators, currently patient is not wheezing so role for steroids unclear  #) Chronic hypoxic respiratory failure: Patient apparently uses 3 L nasal cannula at home.  It is unclear if patient has a diagnosis of COPD though likely patient has a smoking history. -Continue short-acting bronchodilators  #) Pulmonary embolism: Noted on CTA on admission.  Noted to be small subsegmental with the right lower lobe.  Unlikely to be contributing significantly to the patient's current hypoxia.  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  She apparently initially was not on anticoagulation due to thrombocytopenia from chemotherapy but then was placed on rivaroxaban due to CVA being presumed from paroxysmal atrial fibrillation -Continue 1 mg/kg enoxaparin per above  #) Hypertension/hyperlipidemia: -Continue atorvastatin 20 mg daily -Continue aspirin 81 mg daily - Hold amlodipine 5 mg daily -Hold carvedilol 12.5 mg twice daily  #) Lung cancer/cytopenias: These are improving.  Last chemotherapy was in November 2019. -Notified oncology via epic  Fluids: Gentle IV fluids Electrolytes: Monitor and supplement Nutrition: N.p.o. while on BiPAP Exam prophylaxis: Treatment dose Lovenox  Disposition: Pending resolution of hypoxic respiratory failure   Full code   Consultants:   Oncology  Procedures:   Echo 07/02/2018 pending  Bilateral lower extremity vascular ultrasound 07/02/2018 pending  Antimicrobials:   IV cefepime started 07/03/2018  IV vancomycin started 06/24/2018   Subjective: This morning patient reports that her breathing is better.  She denies any pain anywhere.  She is eager to have some food.  She denies any nausea, vomiting, diarrhea, cough, congestion, rhinorrhea.  Objective: Vitals:   07/02/18 0351 07/02/18 0516 07/02/18 0721 07/02/18 0734  BP:  128/60 (!) 122/58 (!) 122/58  Pulse: 99 100 93 100  Resp: (!) 36 (!) 27 (!) 26 (!) 35  Temp:  98.6 F (37 C) 98.1 F (36.7 C)   TempSrc:  Axillary Axillary   SpO2: 95% 96% 96% 94%  Weight:      Height:        Intake/Output Summary (Last 24 hours) at 07/02/2018 0928 Last data filed at 07/02/2018 0734 Gross per 24 hour  Intake 4499.98 ml  Output  1750 ml  Net 2749.98 ml   Filed Weights   07/02/18 0203  Weight: 81.8 kg    Examination:  General exam: On BiPAP, mildly  uncomfortable appearing Respiratory system: Mildly increased work of breathing, currently on BiPAP, diminished lung sounds throughout, scattered rhonchi Cardiovascular system: Regular rate and rhythm, no murmurs Gastrointestinal system: Abdomen is nondistended, soft and nontender. No organomegaly or masses felt. Normal bowel sounds heard. Central nervous system: Alert and oriented.  Grossly intact, moving all extremities Extremities: Trace lower extremity edema Skin: Port site is clean dry and intact Psychiatry: Judgement and insight appear normal. Mood & affect appropriate.     Data Reviewed: I have personally reviewed following labs and imaging studies  CBC: Recent Labs  Lab 06/26/18 1139 06/28/18 0635 06/30/2018 1630 06/30/2018 1857 06/29/2018 2003 07/02/18 0228 07/02/18 0256  WBC 7.5 5.6 9.8  --   --  9.4  --   NEUTROABS 5.8 4.1 7.7  --   --   --   --   HGB 10.8* 9.3* 9.8* 8.2* 10.2* 9.0* 12.6  HCT 34.6* 29.5* 31.3* 24.0* 30.0* 28.6* 37.0  MCV 97.5 95.5 97.5  --   --  96.6  --   PLT 88* 96* 129*  --   --  111*  --    Basic Metabolic Panel: Recent Labs  Lab 06/26/18 1057  06/26/18 1139 06/29/2018 1630 07/06/2018 1857 07/02/2018 2003 07/02/18 0228 07/02/18 0256  NA  --    < > 140 140 140 140 142 144  K  --    < > 4.0 3.7 3.9 3.2* 3.3* 3.4*  CL  --   --  105 100  --   --  112*  --   CO2  --   --  25 25  --   --  20*  --   GLUCOSE  --   --  113* 144*  --   --  128*  --   BUN  --   --  11 17  --   --  12  --   CREATININE 1.10*  --  1.11* 1.34*  --   --  0.93  --   CALCIUM  --   --  8.5* 8.5*  --   --  7.6*  --    < > = values in this interval not displayed.   GFR: Estimated Creatinine Clearance: 56.5 mL/min (by C-G formula based on SCr of 0.93 mg/dL). Liver Function Tests: Recent Labs  Lab 06/26/18 1139 06/27/2018 1630 07/02/18 0228  AST 16 25 17   ALT 12 13 11   ALKPHOS 64 82 93  BILITOT 0.7 1.3* 1.5*  PROT 5.8* 6.2* 5.7*  ALBUMIN 2.3* 2.5* 2.3*   No results for  input(s): LIPASE, AMYLASE in the last 168 hours. No results for input(s): AMMONIA in the last 168 hours. Coagulation Profile: Recent Labs  Lab 06/26/18 1139 06/22/2018 1630  INR 1.14 3.16   Cardiac Enzymes: No results for input(s): CKTOTAL, CKMB, CKMBINDEX, TROPONINI in the last 168 hours. BNP (last 3 results) No results for input(s): PROBNP in the last 8760 hours. HbA1C: No results for input(s): HGBA1C in the last 72 hours. CBG: No results for input(s): GLUCAP in the last 168 hours. Lipid Profile: No results for input(s): CHOL, HDL, LDLCALC, TRIG, CHOLHDL, LDLDIRECT in the last 72 hours. Thyroid Function Tests: No results for input(s): TSH, T4TOTAL, FREET4, T3FREE, THYROIDAB in the last 72 hours. Anemia Panel: No results for input(s): VITAMINB12, FOLATE, FERRITIN, TIBC,  IRON, RETICCTPCT in the last 72 hours. Sepsis Labs: Recent Labs  Lab 06/26/2018 1630 07/02/2018 1850 06/26/2018 2255  LATICACIDVEN 2.2* 1.2 0.9    Recent Results (from the past 240 hour(s))  Culture, blood (Routine x 2)     Status: None (Preliminary result)   Collection Time: 07/12/2018  4:30 PM  Result Value Ref Range Status   Specimen Description BLOOD LEFT FOREARM  Final   Special Requests   Final    BOTTLES DRAWN AEROBIC AND ANAEROBIC Blood Culture results may not be optimal due to an inadequate volume of blood received in culture bottles   Culture   Final    NO GROWTH < 24 HOURS Performed at [facility], [place] [address] [place], [place] [address]    Report Status PENDING  Incomplete  Culture, blood (Routine x 2)     Status: None (Preliminary result)   Collection Time: 06/30/2018  4:35 PM  Result Value Ref Range Status   Specimen Description BLOOD RIGHT HAND  Final   Special Requests   Final    BOTTLES DRAWN AEROBIC AND ANAEROBIC Blood Culture results may not be optimal due to an inadequate volume of blood received in culture bottles   Culture   Final    NO GROWTH < 24 HOURS Performed at [facility], [place] [address], [place], [place] [address]    Report Status PENDING  Incomplete  Respiratory Panel by PCR     Status: None   Collection Time: 07/04/2018 10:00 PM  Result Value Ref Range Status   Adenovirus NOT DETECTED NOT DETECTED Final   Coronavirus 229E NOT DETECTED NOT DETECTED Final    Comment: (NOTE) The Coronavirus on the Respiratory Panel, DOES NOT test for the novel  Coronavirus (2019 nCoV)    Coronavirus HKU1 NOT DETECTED NOT DETECTED Final   Coronavirus NL63 NOT DETECTED NOT DETECTED Final   Coronavirus OC43 NOT DETECTED NOT DETECTED Final   Metapneumovirus NOT DETECTED NOT DETECTED Final   Rhinovirus / Enterovirus NOT DETECTED NOT DETECTED Final   Influenza A NOT DETECTED NOT DETECTED Final   Influenza B NOT DETECTED NOT DETECTED Final   Parainfluenza Virus 1 NOT DETECTED NOT DETECTED Final   Parainfluenza Virus 2 NOT DETECTED NOT DETECTED Final   Parainfluenza Virus 3 NOT DETECTED NOT DETECTED Final   Parainfluenza Virus 4 NOT DETECTED NOT DETECTED Final   Respiratory Syncytial Virus NOT DETECTED NOT DETECTED Final   Bordetella pertussis NOT DETECTED NOT DETECTED Final   Chlamydophila pneumoniae NOT DETECTED NOT DETECTED Final   Mycoplasma pneumoniae NOT DETECTED NOT DETECTED Final    Comment: Performed at [facility], [place]. [address], [place], [place] [address]  MRSA PCR Screening     Status: None   Collection Time: 07/02/18  2:05 AM  Result Value Ref Range Status   MRSA by PCR NEGATIVE NEGATIVE Final    Comment:        The GeneXpert MRSA Assay (FDA approved for NASAL specimens only), is one component of a comprehensive MRSA colonization surveillance program. It is not intended to diagnose MRSA infection nor to guide or monitor treatment for MRSA infections. Performed at [facility], [place] [address] [place], [place] [address]          Radiology Studies: Ct Angio Chest Pe W Or Wo Contrast  Result Date: 07/06/2018 CLINICAL DATA:   73 year old female with shortness of breath EXAM: CT ANGIOGRAPHY CHEST WITH CONTRAST TECHNIQUE: Multidetector CT  imaging of the chest was performed using the standard protocol during bolus administration of intravenous contrast. Multiplanar CT image reconstructions and MIPs were obtained to evaluate the vascular anatomy. CONTRAST:  128mL ISOVUE-370 IOPAMIDOL (ISOVUE-370) INJECTION 76% COMPARISON:  Multiple priors including 06/08/2018, 06/01/2018 05/06/2018 FINDINGS: Cardiovascular: Heart: No cardiomegaly. No pericardial fluid/thickening. Calcifications of left main, left anterior descending, right coronary artery. Right IJ port catheter. Aorta: Unremarkable course caliber and contour of the thoracic aorta. Calcifications of the aortic arch and descending thoracic aorta. No periaortic fluid. No dissection Pulmonary arteries: There is a single filling defect within the subsegmental vessels of the right lower lobe. No other filling defects identified. Mediastinum/Nodes: Unremarkable appearance of the thoracic inlet. Multiple lymph nodes of the mediastinum throughout all nodal stations, increasing size comparison CT. Unremarkable course of the thoracic esophagus. Lungs/Pleura: Mixed ground-glass and confluent airspace opacity the bilateral upper lobes, the right middle lobe, bilateral lower lobes. In the lower lobes, there is more dependent distribution. Bilateral small pleural effusions. The appearance has significantly worsened from the most recent CT of 06/08/2018, and is new from the comparison CT of December. Background of reticulonodular changes. Redemonstration of soft tissue left hilar region in this patient with known carcinoma. Upper Abdomen: No acute. Musculoskeletal: No acute displaced fracture. Degenerative changes of the spine. Review of the MIP images confirms the above findings. . IMPRESSION: Multifocal pneumonia of all lobes, which has significantly progressed from the CT of 06/08/2018. Bilateral small  pleural effusions. Infection accounts for the mediastinal adenopathy, which is most likely reactive. The CT is positive for a single pulmonary embolus involving subsegmental vessel right lower lobe. Soft tissue in the left hilar region is compatible with the patient's known history of lung carcinoma. Aortic atherosclerosis with left main and 2 vessel coronary artery disease. Electronically Signed   By: Corrie Mckusick D.O.   On: 07/05/2018 18:23   Dg Chest Port 1 View  Result Date: 07/09/2018 CLINICAL DATA:  Shortness of breath for several weeks, worsening this morning, wet cough, history lung cancer, atrial fibrillation, stroke, hypertension, former smoker EXAM: PORTABLE CHEST 1 VIEW COMPARISON:  Portable exam 1606 hours compared to 06/11/2018 FINDINGS: RIGHT subclavian Port-A-Cath with tip projecting over SVC. Normal heart size and mediastinal contours. Diffuse pulmonary infiltrates bilaterally significantly increased from previous exam. No pleural effusion or pneumothorax. Bones demineralized. IMPRESSION: Significant increase in diffuse BILATERAL pulmonary infiltrates since 06/11/2018. Electronically Signed   By: Lavonia Dana M.D.   On: 07/08/2018 16:34        Scheduled Meds: . aspirin EC  81 mg Oral Daily  . atorvastatin  20 mg Oral Daily  . enoxaparin (LOVENOX) injection  80 mg Subcutaneous Q12H  . pantoprazole  40 mg Oral Daily  . [START ON 07/03/2018] pneumococcal 23 valent vaccine  0.5 mL Intramuscular Tomorrow-1000  . potassium chloride  40 mEq Oral Once  . sodium chloride flush  10-40 mL Intracatheter Q12H   Continuous Infusions: . sodium chloride 125 mL/hr at 07/02/18 0512  . ceFEPime (MAXIPIME) IV    . [START ON 07/03/2018] vancomycin       LOS: 1 day    Time spent: Normal, MD [facility]  If 7PM-7AM, please contact night-coverage [email or web address] Password [facility] 07/02/2018, 9:28 AM

## 2018-07-02 NOTE — Progress Notes (Signed)
MD notified of patient unable to take PO at this time due to BiPAP; desats quickly w/removal of BiPAP.  Presently on FiO2 of 80% w/O2 sat 92%.

## 2018-07-02 NOTE — Plan of Care (Signed)

## 2018-07-02 NOTE — Progress Notes (Signed)
RT transported pt from ED to [facility] without complications

## 2018-07-02 NOTE — Progress Notes (Signed)
Arterial blood gas results were ran on the I-stat in 106M, the results did not cross over into Epic. The results are as followed ph=7.44,PC02=37,Po2=173,HCO3=24. Doctor made aware of results.

## 2018-07-02 NOTE — Progress Notes (Signed)
Patient admitted form ED with A BIPAP and temp of 101 Axillary. Oriented patient and family to room and surroundings. Telemetry applied and verified times two nurses. Skin assessment done. On call provider notified about elevated temp and need for Tylenol Supp. Orders. Will continue to monitor.

## 2018-07-02 NOTE — Progress Notes (Signed)
Pharmacy Antibiotic Note  Michele [name] is a 73 y.o. female admitted on 06/26/2018 with pneumonia.  Pharmacy has been consulted for vancomycin and cefepime dosing. -WBC= 9, tmax= 101, CrCL ~ 55 -MRSA PCR negative, resp panel- neg, blood cultures- ngtd   Plan: Continue cefepime 2g IV Q24h Change vancomycin to 1250mg  IV q24hr; consider discontinuing Monitor clinical picture, renal function, vanc levels prn F/U C&S, abx deescalation / LOT  Height: 5\' 4"  (162.6 cm) Weight: 180 lb 5.4 oz (81.8 kg) IBW/kg (Calculated) : 54.7  Temp (24hrs), Avg:99.1 F (37.3 C), Min:98.1 F (36.7 C), Max:101 F (38.3 C)  Recent Labs  Lab 06/26/18 1057 06/26/18 1139 06/28/18 0635 06/25/2018 1630 07/12/2018 1850 06/20/2018 2255 07/02/18 0228  WBC  --  7.5 5.6 9.8  --   --  9.4  CREATININE 1.10* 1.11*  --  1.34*  --   --  0.93  LATICACIDVEN  --   --   --  2.2* 1.2 0.9  --     Estimated Creatinine Clearance: 56.5 mL/min (by C-G formula based on SCr of 0.93 mg/dL).    No Known Allergies   Thank you for allowing pharmacy to be a part of this patient's care.  Hildred Laser, PharmD Clinical Pharmacist **Pharmacist phone directory can now be found on [email or web address] ([email or web address]).  Listed under [place].

## 2018-07-02 NOTE — Care Management (Signed)
#  1.   Michele [name]   @ [place] # [phone]  1. LOVENOX  SYRINGES  80 MG  Q12HRS COVER- YES CO-PAY- 25 % OF TOTAL COST TIER- 4 DRUG PRIOR APPROVAL- NO  2. ENOXAPARIN  SYRINGES  80 MG Q12HRS COVER- YES CO-PAY- $ 64.00 TIER- 3 DRUG PRIOR APPROVAL- NO  NO DEDUCTIBLE OUT-OF-POCKET: NOT  MET  PREFERRED PHARMACY : YES [facility] [place]

## 2018-07-02 NOTE — Care Management (Signed)
Benefit check submitted for Lovenox

## 2018-07-02 NOTE — Plan of Care (Signed)
Patient was admitted this AM from ED on BiPAP w/respiratory distress.  Patient remains on BiPAP at 80% FiO2.  Any attempts to remove BiPAP, for oral care, patient desats very quickly.  She needs encouragement to remain on BiPAP and understanding that she cannot eat or drink at this time.  Encourage mouth care as appropriate and for comfort.  Patient is on bedrest at this time.  Generalized weakness noted.  Attending has rounded this AM.  Will continue w/current plan as patient tolerates and escalate care if needed, based on patient's respiratory status.  No plans for discharge at this time.

## 2018-07-02 NOTE — Progress Notes (Signed)
  Echocardiogram 2D Echocardiogram has been performed.  Laster Appling G Trestan Vahle 07/02/2018, 11:32 AM

## 2018-07-03 ENCOUNTER — Inpatient Hospital Stay (HOSPITAL_COMMUNITY): Payer: Medicare Other

## 2018-07-03 DIAGNOSIS — A419 Sepsis, unspecified organism: Principal | ICD-10-CM

## 2018-07-03 DIAGNOSIS — J9621 Acute and chronic respiratory failure with hypoxia: Secondary | ICD-10-CM

## 2018-07-03 DIAGNOSIS — J189 Pneumonia, unspecified organism: Secondary | ICD-10-CM

## 2018-07-03 DIAGNOSIS — I2699 Other pulmonary embolism without acute cor pulmonale: Secondary | ICD-10-CM

## 2018-07-03 LAB — CBC WITH DIFFERENTIAL/PLATELET
Abs Immature Granulocytes: 0.1 10*3/uL — ABNORMAL HIGH (ref 0.00–0.07)
Basophils Absolute: 0 K/uL (ref 0.0–0.1)
Basophils Relative: 0 %
Eosinophils Absolute: 0.2 10*3/uL (ref 0.0–0.5)
Eosinophils Relative: 3 %
HCT: 25.9 % — ABNORMAL LOW (ref 36.0–46.0)
Hemoglobin: 7.9 g/dL — ABNORMAL LOW (ref 12.0–15.0)
Immature Granulocytes: 1 %
Lymphocytes Relative: 3 %
Lymphs Abs: 0.3 10*3/uL — ABNORMAL LOW (ref 0.7–4.0)
MCH: 30.3 pg (ref 26.0–34.0)
MCHC: 30.5 g/dL (ref 30.0–36.0)
MCV: 99.2 fL (ref 80.0–100.0)
Monocytes Absolute: 1 10*3/uL (ref 0.1–1.0)
Monocytes Relative: 11 %
Neutro Abs: 7.3 10*3/uL (ref 1.7–7.7)
Neutrophils Relative %: 82 %
Platelets: 110 10*3/uL — ABNORMAL LOW (ref 150–400)
RBC: 2.61 MIL/uL — ABNORMAL LOW (ref 3.87–5.11)
RDW: 16 % — ABNORMAL HIGH (ref 11.5–15.5)
WBC: 8.9 10*3/uL (ref 4.0–10.5)
nRBC: 0 % (ref 0.0–0.2)

## 2018-07-03 LAB — BLOOD GAS, ARTERIAL
Acid-base deficit: 4.2 mmol/L — ABNORMAL HIGH (ref 0.0–2.0)
Bicarbonate: 23 mmol/L (ref 20.0–28.0)
Drawn by: 358491
FIO2: 100
MECHVT: 430 mL
O2 Saturation: 95.5 %
PEEP: 5 cmH2O
PH ART: 7.181 — AB (ref 7.350–7.450)
Patient temperature: 98.6
RATE: 16 resp/min
pCO2 arterial: 64.1 mmHg — ABNORMAL HIGH (ref 32.0–48.0)
pO2, Arterial: 102 mmHg (ref 83.0–108.0)

## 2018-07-03 LAB — BASIC METABOLIC PANEL
Anion gap: 13 (ref 5–15)
BUN: 9 mg/dL (ref 8–23)
CO2: 23 mmol/L (ref 22–32)
Calcium: 7.7 mg/dL — ABNORMAL LOW (ref 8.9–10.3)
Chloride: 109 mmol/L (ref 98–111)
Creatinine, Ser: 0.96 mg/dL (ref 0.44–1.00)
GFR calc Af Amer: >60 mL/min (ref >60)
GFR calc non Af Amer: 59 mL/min — ABNORMAL LOW (ref >60)
Glucose, Bld: 88 mg/dL (ref 70–99)
Potassium: 3 mmol/L — ABNORMAL LOW (ref 3.5–5.1)
Sodium: 145 mmol/L (ref 135–145)

## 2018-07-03 LAB — PHOSPHORUS: Phosphorus: 3.8 mg/dL (ref 2.5–4.6)

## 2018-07-03 LAB — POCT I-STAT 7, (LYTES, BLD GAS, ICA,H+H)
Acid-base deficit: 4 mmol/L — ABNORMAL HIGH (ref 0.0–2.0)
Acid-base deficit: 3 mmol/L — ABNORMAL HIGH (ref 0.0–2.0)
Bicarbonate: 23.4 mmol/L (ref 20.0–28.0)
Bicarbonate: 22.8 mmol/L (ref 20.0–28.0)
CALCIUM ION: 1.2 mmol/L (ref 1.15–1.40)
Calcium, Ion: 1.18 mmol/L (ref 1.15–1.40)
HCT: 22 % — ABNORMAL LOW (ref 36.0–46.0)
HCT: 22 % — ABNORMAL LOW (ref 36.0–46.0)
Hemoglobin: 7.5 g/dL — ABNORMAL LOW (ref 12.0–15.0)
Hemoglobin: 7.5 g/dL — ABNORMAL LOW (ref 12.0–15.0)
O2 Saturation: 95 %
O2 Saturation: 99 %
PCO2 ART: 52 mmHg — AB (ref 32.0–48.0)
Patient temperature: 98.4
Patient temperature: 97.9
Potassium: 3.5 mmol/L (ref 3.5–5.1)
Potassium: 3.5 mmol/L (ref 3.5–5.1)
SODIUM: 144 mmol/L (ref 135–145)
Sodium: 144 mmol/L (ref 135–145)
TCO2: 25 mmol/L (ref 22–32)
TCO2: 24 mmol/L (ref 22–32)
pCO2 arterial: 44.3 mmHg (ref 32.0–48.0)
pH, Arterial: 7.261 — ABNORMAL LOW (ref 7.350–7.450)
pH, Arterial: 7.319 — ABNORMAL LOW (ref 7.350–7.450)
pO2, Arterial: 88 mmHg (ref 83.0–108.0)
pO2, Arterial: 133 mmHg — ABNORMAL HIGH (ref 83.0–108.0)

## 2018-07-03 LAB — COMPREHENSIVE METABOLIC PANEL
ALT: 12 U/L (ref 0–44)
AST: 20 U/L (ref 15–41)
Albumin: 1.9 g/dL — ABNORMAL LOW (ref 3.5–5.0)
Alkaline Phosphatase: 131 U/L — ABNORMAL HIGH (ref 38–126)
Anion gap: 10 (ref 5–15)
BUN: 9 mg/dL (ref 8–23)
CO2: 22 mmol/L (ref 22–32)
Calcium: 7.7 mg/dL — ABNORMAL LOW (ref 8.9–10.3)
Chloride: 113 mmol/L — ABNORMAL HIGH (ref 98–111)
GFR calc Af Amer: >60 mL/min (ref >60)
GFR calc non Af Amer: >60 mL/min (ref >60)
Glucose, Bld: 88 mg/dL (ref 70–99)
Potassium: 2.9 mmol/L — ABNORMAL LOW (ref 3.5–5.1)
Sodium: 145 mmol/L (ref 135–145)
Total Bilirubin: 1.8 mg/dL — ABNORMAL HIGH (ref 0.3–1.2)
Total Protein: 5.2 g/dL — ABNORMAL LOW (ref 6.5–8.1)

## 2018-07-03 LAB — MAGNESIUM
Magnesium: 1.8 mg/dL (ref 1.7–2.4)
Magnesium: 1.8 mg/dL (ref 1.7–2.4)

## 2018-07-03 LAB — CBC
HCT: 26.2 % — ABNORMAL LOW (ref 36.0–46.0)
Hemoglobin: 7.9 g/dL — ABNORMAL LOW (ref 12.0–15.0)
MCH: 30 pg (ref 26.0–34.0)
MCHC: 30.2 g/dL (ref 30.0–36.0)
MCV: 99.6 fL (ref 80.0–100.0)
Platelets: 107 10*3/uL — ABNORMAL LOW (ref 150–400)
RBC: 2.63 MIL/uL — ABNORMAL LOW (ref 3.87–5.11)
RDW: 16.1 % — ABNORMAL HIGH (ref 11.5–15.5)
WBC: 9.7 10*3/uL (ref 4.0–10.5)
nRBC: 0.2 % (ref 0.0–0.2)

## 2018-07-03 LAB — MRSA PCR SCREENING: MRSA by PCR: NEGATIVE

## 2018-07-03 LAB — GLUCOSE, CAPILLARY
GLUCOSE-CAPILLARY: 82 mg/dL (ref 70–99)
Glucose-Capillary: 89 mg/dL (ref 70–99)
Glucose-Capillary: 99 mg/dL (ref 70–99)

## 2018-07-03 LAB — PROCALCITONIN: Procalcitonin: 0.53 ng/mL

## 2018-07-03 LAB — COMPREHENSIVE METABOLIC PANEL WITH GFR: Creatinine, Ser: 0.94 mg/dL (ref 0.44–1.00)

## 2018-07-03 MED ORDER — POTASSIUM CHLORIDE 10 MEQ/100ML IV SOLN
10.0000 meq | INTRAVENOUS | Status: AC
  Administered 2018-07-03 (×4): 10 meq via INTRAVENOUS
  Filled 2018-07-03 (×4): qty 100

## 2018-07-03 MED ORDER — CHLORHEXIDINE GLUCONATE 0.12% ORAL RINSE (MEDLINE KIT)
15.0000 mL | Freq: Two times a day (BID) | OROMUCOSAL | Status: DC
  Administered 2018-07-03 – 2018-07-15 (×24): 15 mL via OROMUCOSAL

## 2018-07-03 MED ORDER — FENTANYL CITRATE (PF) 100 MCG/2ML IJ SOLN
100.0000 ug | Freq: Once | INTRAMUSCULAR | Status: AC
  Administered 2018-07-03: 100 ug via INTRAVENOUS

## 2018-07-03 MED ORDER — MIDAZOLAM HCL 2 MG/2ML IJ SOLN
2.0000 mg | Freq: Once | INTRAMUSCULAR | Status: AC
  Administered 2018-07-03: 2 mg via INTRAVENOUS

## 2018-07-03 MED ORDER — POTASSIUM CHLORIDE 10 MEQ/100ML IV SOLN: 10.0000 meq | INTRAVENOUS | Status: AC

## 2018-07-03 MED ORDER — ROCURONIUM BROMIDE 50 MG/5ML IV SOLN
50.0000 mg | Freq: Once | INTRAVENOUS | Status: AC
  Administered 2018-07-03: 50 mg via INTRAVENOUS

## 2018-07-03 MED ORDER — PRO-STAT SUGAR FREE PO LIQD
30.0000 mL | Freq: Two times a day (BID) | ORAL | Status: DC
  Administered 2018-07-03 – 2018-07-15 (×24): 30 mL
  Filled 2018-07-03 (×22): qty 30

## 2018-07-03 MED ORDER — SODIUM CHLORIDE 0.9 % IV SOLN
2.0000 g | Freq: Two times a day (BID) | INTRAVENOUS | Status: DC
  Administered 2018-07-03 – 2018-07-04 (×3): 2 g via INTRAVENOUS
  Filled 2018-07-03 (×4): qty 2

## 2018-07-03 MED ORDER — VITAL AF 1.2 CAL PO LIQD
1000.0000 mL | ORAL | Status: DC
  Administered 2018-07-03 – 2018-07-07 (×4): 1000 mL
  Filled 2018-07-03 (×2): qty 1000

## 2018-07-03 MED ORDER — ETOMIDATE 2 MG/ML IV SOLN
20.0000 mg | Freq: Once | INTRAVENOUS | Status: AC
  Administered 2018-07-03: 20 mg via INTRAVENOUS

## 2018-07-03 MED ORDER — ORAL CARE MOUTH RINSE
15.0000 mL | OROMUCOSAL | Status: DC
  Administered 2018-07-03 – 2018-07-16 (×122): 15 mL via OROMUCOSAL

## 2018-07-03 MED ORDER — FENTANYL 2500MCG IN NS 250ML (10MCG/ML) PREMIX INFUSION
25.0000 ug/h | INTRAVENOUS | Status: DC
  Administered 2018-07-03: 50 ug/h via INTRAVENOUS
  Administered 2018-07-04: 350 ug/h via INTRAVENOUS
  Administered 2018-07-04: 150 ug/h via INTRAVENOUS
  Filled 2018-07-03 (×3): qty 250

## 2018-07-03 MED ORDER — BISACODYL 10 MG RE SUPP: 10.0000 mg | Freq: Every day | RECTAL | Status: DC | PRN

## 2018-07-03 MED ORDER — VITAL HIGH PROTEIN PO LIQD: 1000.0000 mL | ORAL | Status: DC

## 2018-07-03 MED ORDER — DOCUSATE SODIUM 50 MG/5ML PO LIQD: 100.0000 mg | Freq: Two times a day (BID) | ORAL | Status: DC | PRN

## 2018-07-03 MED ORDER — FENTANYL BOLUS VIA INFUSION
25.0000 ug | INTRAVENOUS | Status: DC | PRN
  Administered 2018-07-03 – 2018-07-04 (×2): 25 ug via INTRAVENOUS
  Filled 2018-07-03: qty 25

## 2018-07-03 MED ORDER — FENTANYL CITRATE (PF) 100 MCG/2ML IJ SOLN
50.0000 ug | Freq: Once | INTRAMUSCULAR | Status: AC
  Administered 2018-07-03: 50 ug via INTRAVENOUS
  Filled 2018-07-03: qty 2

## 2018-07-03 MED ORDER — PNEUMOCOCCAL VAC POLYVALENT 25 MCG/0.5ML IJ INJ
0.5000 mL | INJECTION | INTRAMUSCULAR | Status: AC
  Administered 2018-07-05: 0.5 mL via INTRAMUSCULAR
  Filled 2018-07-03: qty 0.5

## 2018-07-03 MED ORDER — LACTATED RINGERS IV BOLUS
250.0000 mL | Freq: Once | INTRAVENOUS | Status: AC
  Administered 2018-07-03: 250 mL via INTRAVENOUS

## 2018-07-03 NOTE — Procedures (Signed)
Bronchoscopy Procedure Note Michele [name] [id number] [birth year]  Procedure: Bronchoscopy Indications: Obtain specimens for culture and/or other diagnostic studies  Procedure Details Consent: Risks of procedure as well as the alternatives and risks of each were explained to the (patient/caregiver).  Consent for procedure obtained. Time Out: Verified patient identification, verified procedure, site/side was marked, verified correct patient position, special equipment/implants available, medications/allergies/relevent history reviewed, required imaging and test results available.  Performed  In preparation for procedure, patient was given 100% FiO2 and bronchoscope lubricated. Sedation: Benzodiazepines, Muscle relaxants, Etomidate and Fentanyl  Airway entered and the following bronchi were examined: RUL, RML, RLL, LUL, LLL and Bronchi.   Procedures performed: Brushings performed Bronchoscope removed.  , Patient placed back on 100% FiO2 at conclusion of procedure.    Evaluation Hemodynamic Status: BP stable throughout; O2 sats: stable throughout Patient's Current Condition: stable Specimens:  Sent purulent fluid Complications: No apparent complications Patient did tolerate procedure well.   Jennet Maduro 07/03/2018

## 2018-07-03 NOTE — Progress Notes (Signed)
RT NOTE: RT transported patient with RN from 2C07 to 1E07 without complications.

## 2018-07-03 NOTE — Progress Notes (Signed)
RT attempted to wean pts FIO2 down and pt began to desat very quickly. Pt sats were 95 before adjustment and after sats were 88. RT returned pt back to previous settings and pt took some time to recover. RT will continue to monitor.

## 2018-07-03 NOTE — Progress Notes (Signed)
Initial Nutrition Assessment  DOCUMENTATION CODES:   Not applicable  INTERVENTION:   Tube Feeding:  Vital AF 1.2 @ 50 ml/hr Pro-Stat 30 mL BID Provides 1640 kcals, 120 g of protein and 972 mL of free water Provides 100% estimated calorie and protein needs  NUTRITION DIAGNOSIS:   Inadequate oral intake related to acute illness as evidenced by NPO status.  GOAL:   Patient will meet greater than or equal to 90% of their needs   MONITOR:   TF tolerance, Vent status, Labs, Weight trends  REASON FOR ASSESSMENT:   Ventilator    ASSESSMENT:   73 yo female admitted with sepsis and acute respiratory due to profound multilobar pneumonia, LLL lung adenocarcinoma s/p XRT and chemo (finished November 2019). PMH includes HTN, HLD, CVA   Patient is currently intubated on ventilator support MV: 12.1 L/min Temp (24hrs), Avg:99 F (37.2 C), Min:98.2 F (36.8 C), Max:99.7 F (37.6 C)  2/14 Bronch, OG tube placed, Intubated  Unable to obtain diet and weight history at this time  Per weight encounters, pt with 4.7% wt loss since August   Labs: reviewed Meds: NS at 125 ml/hr     NUTRITION - FOCUSED PHYSICAL EXAM:    Most Recent Value  Orbital Region  Mild depletion  Upper Arm Region  No depletion  Thoracic and Lumbar Region  No depletion  Buccal Region  Unable to assess  Temple Region  Moderate depletion  Clavicle Bone Region  No depletion  Clavicle and Acromion Bone Region  No depletion  Scapular Bone Region  Mild depletion  Dorsal Hand  Unable to assess  Patellar Region  Mild depletion  Anterior Thigh Region  Mild depletion  Posterior Calf Region  Moderate depletion  Edema (RD Assessment)  Mild       Diet Order:   Diet Order            Diet NPO time specified Except for: Sips with Meds  Diet effective now              EDUCATION NEEDS:   Not appropriate for education at this time  Skin:  Skin Assessment: Reviewed RN Assessment  Last BM:   2/9  Height:   Ht Readings from Last 1 Encounters:  07/02/18 5\' 4"  (1.626 m)    Weight:   Wt Readings from Last 1 Encounters:  07/02/18 81.8 kg    Ideal Body Weight:     BMI:  Body mass index is 30.95 kg/m.  Estimated Nutritional Needs:   Kcal:  1640 kcals  Protein:  115-130 g  Fluid:  >/= 1.7 L   Kerman Passey MS, RD, LDN, CNSC [phone] Pager  [phone] Weekend/On-Call Pager

## 2018-07-03 NOTE — Consult Note (Addendum)
NAME:  Michele [name], MRN:  [id number], DOB:  [birth year], LOS: 2 ADMISSION DATE:  06/21/2018, CONSULTATION DATE:  07/03/2018 REFERRING MD:  TRH Thereasa Solo, CHIEF COMPLAINT:  Respiratory failure and HCAP   Brief History   73 year old female with PMH of left lung adenocarcinoma with mediastinal LAN who underwent chemo and radiation therapy who has been struggling with PNA and now developed respiratory failure failing BiPAP.  Patient is unable to communicate on BiPAP via nothing but nods so the history is obtained via husband.   History of present illness   73 year old female with PMH of left lung adenocarcinoma with mediastinal LAN who underwent chemo and radiation therapy who has been struggling with PNA and now developed respiratory failure failing BiPAP.  Patient is unable to communicate on BiPAP via nothing but nods so the history is obtained via husband.   Past Medical History  Stage III adeno of the lung  Significant Hospital Events   2/14 respiratory failure  Consults:  PCCM  Procedures:  ETT 2/14>>>  Significant Diagnostic Tests:  CT of the chest with diffuse infiltrate  Micro Data:  Blood 2/14>>> Urine 2/14>>> Sputum 2/14>>> BAL 2/14>>>  Antimicrobials:  Cefepime 2/14>>> Vancomycin 2/14>>>   Interim history/subjective:  Respiratory failure  Objective   Blood pressure 135/64, pulse (!) 101, temperature 98.7 F (37.1 C), temperature source Axillary, resp. rate (!) 34, height _0  (1.626 m), weight 81.8 kg, SpO2 90 %.    FiO2 (%):  [70 %-80 %] 80 %   Intake/Output Summary (Last 24 hours) at 07/03/2018 1009 Last data filed at 07/03/2018 0800 Gross per 24 hour  Intake 3914.67 ml  Output 1800 ml  Net 2114.67 ml   Filed Weights   07/02/18 0203  Weight: 81.8 kg    Examination: General: Chronically ill appearing female, in acute respiratory distress HENT: [place]/AT, PERRL, EOM-I and MMM Lungs: Coarse BS diffusely Cardiovascular: Soft, NT, ND and +BS Abdomen: Soft,  NT, ND and +BS Extremities: -edema and -tenderness Neuro: Alert and interactive, moving all ext to command Skin: Thin but intact  I reviewed chest CT myself, radiation burn and infiltrate noted.  Resolved Hospital Problem list   N/A  Assessment & Plan:  73 year old female with stage III adeno presenting in respiratory failure from HCAP.  I reviewed chest CT myself, infiltrate noted.  Discussed with PCCM-NP.  HCAP:  - Cefepime  - Vanc  - Pan culture  - BAL today  VDRF:  - Intubate  - Full vent support  - ABG  - Adjust vent for ABG  - Vent bundle  GOC:  - Spoke with patient and husband, LCB with no CPR/cardioversion/trach/peg  H/O: adeno CA, monitor  PE:  - Continue full dose lovenox  Hg steady drop:  - Stool OB  - Continue anticoagulation for now as there are no overt signs of bleeding.  Leukocytosis:  - CBC in AM  Labs   CBC: Recent Labs  Lab 06/26/18 1139 06/28/18 0635 06/20/2018 1630  06/24/2018 2003 07/02/18 0228 07/02/18 0256 07/03/18 0429 07/03/18 0556  WBC 7.5 5.6 9.8  --   --  9.4  --  8.9 9.7  NEUTROABS 5.8 4.1 7.7  --   --   --   --  7.3  --   HGB 10.8* 9.3* 9.8*   < > 10.2* 9.0* 12.6 7.9* 7.9*  HCT 34.6* 29.5* 31.3*   < > 30.0* 28.6* 37.0 25.9* 26.2*  MCV 97.5 95.5  97.5  --   --  96.6  --  99.2 99.6  PLT 88* 96* 129*  --   --  111*  --  110* 107*   < > = values in this interval not displayed.    Basic Metabolic Panel: Recent Labs  Lab 06/26/18 1139 07/11/2018 1630  06/21/2018 2003 07/02/18 0228 07/02/18 0256 07/03/18 0429 07/03/18 0556  NA 140 140   < > 140 142 144 145 145  K 4.0 3.7   < > 3.2* 3.3* 3.4* 2.9* 3.0*  CL 105 100  --   --  112*  --  113* 109  CO2 25 25  --   --  20*  --  22 23  GLUCOSE 113* 144*  --   --  128*  --  88 88  BUN 11 17  --   --  12  --  9 9  CREATININE 1.11* 1.34*  --   --  0.93  --  0.94 0.96  CALCIUM 8.5* 8.5*  --   --  7.6*  --  7.7* 7.7*  MG  --   --   --   --   --   --  1.8  --    < > = values in this  interval not displayed.   GFR: Estimated Creatinine Clearance: 54.8 mL/min (by C-G formula based on SCr of 0.96 mg/dL). Recent Labs  Lab 06/24/2018 1630 06/22/2018 1850 06/26/2018 2255 07/02/18 0228 07/03/18 0429 07/03/18 0556  PROCALCITON  --   --   --  0.39 0.53  --   WBC 9.8  --   --  9.4 8.9 9.7  LATICACIDVEN 2.2* 1.2 0.9  --   --   --     Liver Function Tests: Recent Labs  Lab 06/26/18 1139 06/23/2018 1630 07/02/18 0228 07/03/18 0429  AST _0 ALT _1 ALKPHOS 64 82 93 131*  BILITOT 0.7 1.3* 1.5* 1.8*  PROT 5.8* 6.2* 5.7* 5.2*  ALBUMIN 2.3* 2.5* 2.3* 1.9*   No results for input(s): LIPASE, AMYLASE in the last 168 hours. No results for input(s): AMMONIA in the last 168 hours.  ABG    Component Value Date/Time   PHART 7.439 07/02/2018 0256   PCO2ART 36.5 07/02/2018 0256   PO2ART 173.0 (H) 07/02/2018 0256   HCO3 24.4 07/02/2018 0256   TCO2 25 07/02/2018 0256   ACIDBASEDEF 2.0 06/23/2018 2003   O2SAT 100.0 07/02/2018 0256     Coagulation Profile: Recent Labs  Lab 06/26/18 1139 07/05/2018 1630  INR 1.14 3.16    Cardiac Enzymes: No results for input(s): CKTOTAL, CKMB, CKMBINDEX, TROPONINI in the last 168 hours.  HbA1C: Hgb A1c MFr Bld  Date/Time Value Ref Range Status  06/27/2018 06:41 AM 6.5 (H) 4.8 - 5.6 % Final    Comment:    (NOTE) Pre diabetes:          5.7%-6.4% Diabetes:              >6.4% Glycemic control for   <7.0% adults with diabetes     CBG: No results for input(s): GLUCAP in the last 168 hours.  Review of Systems:   Unattainable, on BiPAP  Past Medical History  She,  has a past medical history of A-fib ([place]), Acute kidney failure, unspecified ([place]), Cancer ([place]), CKD (chronic kidney disease), stage III ([place]), CVA (cerebral vascular accident) ([place]), Hyperlipidemia, Hypertension, Hyponatremia, Lung cancer ([place]), Normocytic anemia, Stroke ([place]), Thrombocytopenia, unspecified (  [place]), and Vision loss.   Surgical History     Past Surgical History:  Procedure Laterality Date  . ENDOBRONCHIAL ULTRASOUND Bilateral 01/08/2018   Procedure: ENDOBRONCHIAL ULTRASOUND;  Surgeon: Garner Nash, DO;  Location: [facility];  Service: Cardiopulmonary;  Laterality: Bilateral;  . FINE NEEDLE ASPIRATION  01/08/2018   Procedure: FINE NEEDLE ASPIRATION (FNA) LINEAR;  Surgeon: Garner Nash, DO;  Location: [facility] ENDOS[facility];  Service: Cardiopulmonary;;  . FLEXIBLE BRONCHOSCOPY  01/08/2018   Procedure: FLEXIBLE BRONCHOSCOPY;  Surgeon: Garner Nash, DO;  Location: [facility];  Service: Cardiopulmonary;;  . TUBAL LIGATION       Social History   reports that she quit smoking about 10 years ago. Her smoking use included cigarettes. She has a 25.00 pack-year smoking history. She has never used smokeless tobacco. She reports that she does not drink alcohol or use drugs.   Family History   Her family history includes Breast cancer in her mother; Dementia in her mother; Heart attack in her father.   Allergies No Known Allergies   Home Medications  Prior to Admission medications   Medication Sig Start Date End Date Taking? Authorizing Provider  acetaminophen (TYLENOL) 325 MG tablet Take 650 mg by mouth every 6 (six) hours as needed (for fever and/or pain).    Yes [provider]  amLODipine (NORVASC) 5 MG tablet Take 5 mg by mouth daily.   Yes [provider]  aspirin EC 81 MG tablet Take 81 mg by mouth daily.   Yes [provider]  atorvastatin (LIPITOR) 20 MG tablet Take 20 mg by mouth daily. 11/19/17  Yes [provider]  carvedilol (COREG) 12.5 MG tablet Take 12.5 mg by mouth 2 (two) times daily with a meal.   Yes [provider]  ibuprofen (ADVIL,MOTRIN) 200 MG tablet Take 400 mg by mouth every 6 (six) hours as needed for headache or moderate pain.   Yes [provider]  ipratropium-albuterol (DUONEB) 0.5-2.5 (3) MG/3ML SOLN Take 3 mLs by nebulization daily as needed (for  wheezing).   Yes [provider]  Magnesium Oxide 400 (240 Mg) MG TABS Take 400 mg by mouth 2 (two) times daily.   Yes [provider]  omeprazole (PRILOSEC) 20 MG capsule Take 20 mg by mouth daily.   Yes [provider]  OXYGEN Inhale 3-8 L into the lungs continuous.   Yes [provider]  rivaroxaban (XARELTO) 20 MG TABS tablet Take 1 tablet (20 mg total) by mouth daily with supper. Patient taking differently: Take 20 mg by mouth daily.  06/29/18 09/27/18 Yes Spongberg, Audie Pinto, MD    The patient is critically ill with multiple organ systems failure and requires high complexity decision making for assessment and support, frequent evaluation and titration of therapies, application of advanced monitoring technologies and extensive interpretation of multiple databases.   Critical Care Time devoted to patient care services described in this note is  45  Minutes. This time reflects time of care of this signee Dr Jennet Maduro. This critical care time does not reflect procedure time, or teaching time or supervisory time of PA/NP/Med student/Med Resident etc but could involve care discussion time.  Rush Farmer, M.D. [facility] Pulmonary/Critical Care Medicine. Pager: [phone]. After hours pager: [phone].

## 2018-07-03 NOTE — Care Management Important Message (Signed)
Important Message  Patient Details  Name: Michele [name] MRN: [id number] Date of Birth: November 16, 1945   Medicare Important Message Given:  Yes    Barb Merino [place] 07/03/2018, 11:41 AM

## 2018-07-03 NOTE — Procedures (Signed)
Intubation Procedure Note Michele [name] [id number] [birth year]  Procedure: Intubation Indications: Respiratory insufficiency  Procedure Details Consent: Risks of procedure as well as the alternatives and risks of each were explained to the (patient/caregiver).  Consent for procedure obtained. Time Out: Verified patient identification, verified procedure, site/side was marked, verified correct patient position, special equipment/implants available, medications/allergies/relevent history reviewed, required imaging and test results available.  Performed  Maximum sterile technique was used including gloves, gown, hand hygiene and mask.  MAC and 3    Evaluation Hemodynamic Status: BP stable throughout; O2 sats: transiently fell during during procedure Patient's Current Condition: stable Complications: No apparent complications Patient did tolerate procedure well. Chest X-ray ordered to verify placement.  CXR: pending.   Mcneil Sober 07/03/2018

## 2018-07-03 NOTE — Procedures (Signed)
OGT Placement By MD  OGT placed under direct laryngoscopy and confirmed by auscultation  Rush Farmer, M.D. [facility] Pulmonary/Critical Care Medicine. Pager: [phone]. After hours pager: [phone].

## 2018-07-03 NOTE — Progress Notes (Signed)
[place] TEAM 1 - Stepdown/ICU TEAM  Michele [name]  MVH:[id number] DOB: 07/04/1945 DOA: 07/04/2018 PCP: Ronita Hipps, MD    Brief Narrative:  73yo F with a hx of hypertension, hyperlipidemia, paroxysmal atrial fibrillation on rivaroxaban, lung cancer status post chemo and radiation (finished Nov 2019), and an admission for CVA 2/7 > 2/10 who was admitted to [facility] 06/28/2018 with acute hypoxic respiratory failure due to multifocal pneumonia. She was incidentally found to have small a pulmonary embolism.  Significant Events: 2/12 admit 2/13 TTE EF 60-65% 2/13 B LE venous duplex -   Subjective: I was called to the patient's bedside by her nurse first thing this morning due to respiratory distress.  I arrived to find the patient on BiPAP at 80% oxygen support breathing 38 times a minute.  She is alert and interactive but is clearly experiencing markedly increased work of breathing.  She tells me she is tired.  She has been on BiPAP since her admission and any attempt to remove her results in rapid desaturation.  She denies chest pain nausea or vomiting.  I have expressed to her my concern that she will not be able to continue on BiPAP alone and have suggested that we intubate her.  She wishes to discuss this with her husband further before making a decision on whether or not to be intubated.  I have advised her that this is reasonable but the time is of the essence.  Assessment & Plan:  Sepsis and acute hypoxic respiratory failure due to profound multilobar pneumonia Cont empiric abx tx - will require intubation if she desires continued full code status - CT is impressive and will not improve in short course   LLL Lung adenocarcinoma w/ mediastinal and hilar adenopathy s/p XRT and Chemo ([place]) finished November 2019  Chronic hypoxic respiratory failure uses 3 L nasal cannula at home - unclear if patient has a diagnosis of COPD - she does have a smoking history  Pulmonary  embolism Noted on CTA on admission - small subsegmental in the right lower lobe - possibly occurred in the setting of being on rivaroxaban for atrial fibrillation, though it may predate this - no signif RV strain on TTE   Paroxysmal atrial fibrillation Cont full anticoag - HR controlled   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 - subacute CVAs left occipital cortex, right posterior frontal cortex, and right cerebellum Felt to be embolic in setting of PAF - Xarelto started in response - was previously not on anticoagulation due to thrombocytopenia from chemotherapy   Hypertension  Hyperlipidemia Continue atorvastatin   Hypokalemia  Replace and follow - check Mg  Normocytic anemia   ?Newly diagnosed DM A1c meets criteria for DM - SSI - follow CBG   DVT prophylaxis: lovenox  Code Status: FULL CODE Family Communication: no family present at time of exam  Disposition Plan: SDU - intubation or NCB pending   Consultants:  PCCM  Antimicrobials:  cefepime 2/12 > vancomycin 2/12 >  Objective: Blood pressure 112/63, pulse 99, temperature 98.7 F (37.1 C), temperature source Axillary, resp. rate (!) 29, height 5\' 4"  (1.626 m), weight 81.8 kg, SpO2 91 %.  Intake/Output Summary (Last 24 hours) at 07/03/2018 0838 Last data filed at 07/03/2018 0800 Gross per 24 hour  Intake 3914.67 ml  Output 1800 ml  Net 2114.67 ml   Filed Weights   07/02/18 0203  Weight: 81.8 kg    Examination: General: clear resp distress w/ increased WOB Lungs:  course crackles th/o  Cardiovascular: RRR - no M - no rub  Abdomen: Nontender, nondistended, soft, bowel sounds positive, no rebound, no ascites, no appreciable mass Extremities: No significant cyanosis, clubbing, or edema bilateral lower extremities  CBC: Recent Labs  Lab 06/28/18 0635 07/04/2018 1630  07/02/18 0228 07/02/18 0256 07/03/18 0429 07/03/18 0556  WBC 5.6 9.8  --  9.4  --  8.9 9.7  NEUTROABS 4.1 7.7  --   --   --  7.3  --   HGB 9.3*  9.8*   < > 9.0* 12.6 7.9* 7.9*  HCT 29.5* 31.3*   < > 28.6* 37.0 25.9* 26.2*  MCV 95.5 97.5  --  96.6  --  99.2 99.6  PLT 96* 129*  --  111*  --  110* 107*   < > = values in this interval not displayed.   Basic Metabolic Panel: Recent Labs  Lab 07/02/18 0228 07/02/18 0256 07/03/18 0429 07/03/18 0556  NA 142 144 145 145  K 3.3* 3.4* 2.9* 3.0*  CL 112*  --  113* 109  CO2 20*  --  22 23  GLUCOSE 128*  --  88 88  BUN 12  --  9 9  CREATININE 0.93  --  0.94 0.96  CALCIUM 7.6*  --  7.7* 7.7*  MG  --   --  1.8  --    GFR: Estimated Creatinine Clearance: 54.8 mL/min (by C-G formula based on SCr of 0.96 mg/dL).  Liver Function Tests: Recent Labs  Lab 06/26/18 1139 07/14/2018 1630 07/02/18 0228 07/03/18 0429  AST 16 25 17 20   ALT 12 13 11 12   ALKPHOS 64 82 93 131*  BILITOT 0.7 1.3* 1.5* 1.8*  PROT 5.8* 6.2* 5.7* 5.2*  ALBUMIN 2.3* 2.5* 2.3* 1.9*    Coagulation Profile: Recent Labs  Lab 06/26/18 1139 07/13/2018 1630  INR 1.14 3.16    HbA1C: Hgb A1c MFr Bld  Date/Time Value Ref Range Status  06/27/2018 06:41 AM 6.5 (H) 4.8 - 5.6 % Final    Comment:    (NOTE) Pre diabetes:          5.7%-6.4% Diabetes:              >6.4% Glycemic control for   <7.0% adults with diabetes     Recent Results (from the past 240 hour(s))  Culture, blood (Routine x 2)     Status: None (Preliminary result)   Collection Time: 06/20/2018  4:30 PM  Result Value Ref Range Status   Specimen Description BLOOD LEFT FOREARM  Final   Special Requests   Final    BOTTLES DRAWN AEROBIC AND ANAEROBIC Blood Culture results may not be optimal due to an inadequate volume of blood received in culture bottles   Culture   Final    NO GROWTH 2 DAYS Performed at [facility], [place] [address], [place], [place] [address]    Report Status PENDING  Incomplete  Culture, blood (Routine x 2)     Status: None (Preliminary result)   Collection Time: 06/21/2018  4:35 PM  Result Value Ref Range Status   Specimen  Description BLOOD RIGHT HAND  Final   Special Requests   Final    BOTTLES DRAWN AEROBIC AND ANAEROBIC Blood Culture results may not be optimal due to an inadequate volume of blood received in culture bottles   Culture   Final    NO GROWTH 2 DAYS Performed at [facility], [place]  [address], [place], [place] [address]    Report Status PENDING  Incomplete  Respiratory Panel by PCR     Status: None   Collection Time: 06/21/2018 10:00 PM  Result Value Ref Range Status   Adenovirus NOT DETECTED NOT DETECTED Final   Coronavirus 229E NOT DETECTED NOT DETECTED Final    Comment: (NOTE) The Coronavirus on the Respiratory Panel, DOES NOT test for the novel  Coronavirus (2019 nCoV)    Coronavirus HKU1 NOT DETECTED NOT DETECTED Final   Coronavirus NL63 NOT DETECTED NOT DETECTED Final   Coronavirus OC43 NOT DETECTED NOT DETECTED Final   Metapneumovirus NOT DETECTED NOT DETECTED Final   Rhinovirus / Enterovirus NOT DETECTED NOT DETECTED Final   Influenza A NOT DETECTED NOT DETECTED Final   Influenza B NOT DETECTED NOT DETECTED Final   Parainfluenza Virus 1 NOT DETECTED NOT DETECTED Final   Parainfluenza Virus 2 NOT DETECTED NOT DETECTED Final   Parainfluenza Virus 3 NOT DETECTED NOT DETECTED Final   Parainfluenza Virus 4 NOT DETECTED NOT DETECTED Final   Respiratory Syncytial Virus NOT DETECTED NOT DETECTED Final   Bordetella pertussis NOT DETECTED NOT DETECTED Final   Chlamydophila pneumoniae NOT DETECTED NOT DETECTED Final   Mycoplasma pneumoniae NOT DETECTED NOT DETECTED Final    Comment: Performed at [facility], [place]. [address], [place], [place] [address]  MRSA PCR Screening     Status: None   Collection Time: 07/02/18  2:05 AM  Result Value Ref Range Status   MRSA by PCR NEGATIVE NEGATIVE Final    Comment:        The GeneXpert MRSA Assay (FDA approved for NASAL specimens only), is one component of a comprehensive MRSA colonization surveillance program. It is not intended to  diagnose MRSA infection nor to guide or monitor treatment for MRSA infections. Performed at [facility], [place] [address] [place],  [address]      Scheduled Meds: . aspirin EC  81 mg Oral Daily  . atorvastatin  20 mg Oral Daily  . chlorhexidine  15 mL Mouth Rinse BID  . enoxaparin (LOVENOX) injection  80 mg Subcutaneous Q12H  . mouth rinse  15 mL Mouth Rinse q12n4p  . pantoprazole  40 mg Oral Daily  . pneumococcal 23 valent vaccine  0.5 mL Intramuscular Tomorrow-1000  . potassium chloride  40 mEq Oral Once  . sodium chloride flush  10-40 mL Intracatheter Q12H   Continuous Infusions: . sodium chloride 125 mL/hr at 07/03/18 0800  . ceFEPime (MAXIPIME) IV 2 g (07/02/18 1522)  . vancomycin 1,250 mg (07/02/18 1717)     LOS: 2 days   Michele Altes, MD [facility] Office  [phone] Pager - Text Page per Amion  If 7PM-7AM, please contact night-coverage per Amion 07/03/2018, 8:38 AM

## 2018-07-03 NOTE — Progress Notes (Signed)
ANTICOAGULATION CONSULT NOTE - Follow Up Consult  Pharmacy Consult for enoxaparin Indication: pulmonary embolus  No Known Allergies  Patient Measurements: TBW 82.1 kg  Vital Signs: Temp: 98.7 F (37.1 C) (02/14 0800) Temp Source: Axillary (02/14 0800) BP: 135/64 (02/14 0800) Pulse Rate: 101 (02/14 0800)  Labs: Recent Labs    06/28/2018 1630  07/02/18 0228 07/02/18 0256 07/03/18 0429 07/03/18 0556  HGB 9.8*   < > 9.0* 12.6 7.9* 7.9*  HCT 31.3*   < > 28.6* 37.0 25.9* 26.2*  PLT 129*  --  111*  --  110* 107*  LABPROT 31.9*  --   --   --   --   --   INR 3.16  --   --   --   --   --   CREATININE 1.34*  --  0.93  --  0.94 0.96   < > = values in this interval not displayed.    Estimated Creatinine Clearance: 54.8 mL/min (by C-G formula based on SCr of 0.96 mg/dL).  Assessment: 25 yoF with PMH of lung ca, thrombocytopenia, and AFib c/b recent CVA. Pt was started on Xarelto on 2/8, now transitioned to enoxaparin. Hgb low but stable, pltc consistent with baseline thrombocytopenia, renal function stable.  Goal of Therapy:  Anti-Xa 0.6-1.0 Monitor platelets by anticoagulation protocol: Yes   Plan:  -Continue enoxaparin 80mg  SQ q12h -Monitor CBC  Arrie Senate, PharmD, BCPS Clinical Pharmacist [phone] Please check AMION for all [place] numbers 07/03/2018

## 2018-07-03 NOTE — Progress Notes (Signed)
Pharmacy Antibiotic Note  Michele [name] is a 73 y.o. female admitted on 07/07/2018 with pneumonia.  Pharmacy has been consulted for vancomycin and cefepime dosing. SCr has improved, WBC wnl, cultures negative.   Plan: -Continue vancomycin 1250mg  IV q24h -Adjust cefepime to 2g IV q12h -Vancomycin levels as needed -Monitor LOT, renal function, cultures   Height: 5\' 4"  (162.6 cm) Weight: 180 lb 5.4 oz (81.8 kg) IBW/kg (Calculated) : 54.7  Temp (24hrs), Avg:99.1 F (37.3 C), Min:97.9 F (36.6 C), Max:99.7 F (37.6 C)  Recent Labs  Lab 06/26/18 1139 06/28/18 0635 06/30/2018 1630 06/21/2018 1850 06/22/2018 2255 07/02/18 0228 07/03/18 0429 07/03/18 0556  WBC 7.5 5.6 9.8  --   --  9.4 8.9 9.7  CREATININE 1.11*  --  1.34*  --   --  0.93 0.94 0.96  LATICACIDVEN  --   --  2.2* 1.2 0.9  --   --   --     Estimated Creatinine Clearance: 54.8 mL/min (by C-G formula based on SCr of 0.96 mg/dL).    No Known Allergies   Thank you for allowing pharmacy to be a part of this patient's care.  Arrie Senate, PharmD, BCPS Clinical Pharmacist [phone] Please check AMION for all [place] numbers 07/03/2018

## 2018-07-03 NOTE — Procedures (Signed)
Bedside Bronchoscopy Procedure Note Michele [name] [id number] 09/09/1945  Procedure: Bronchoscopy Indications: Diagnostic evaluation of the airways  Procedure Details: ET Tube Size: ET Tube secured at lip (cm): Bite block in place: No In preparation for procedure, Patient hyper-oxygenated with 100 % FiO2 Airway entered and the following bronchi were examined: RUL, RML, RLL, LUL and LLL.   Bronchoscope removed.    Evaluation BP 111/73 (BP Location: Right Arm)   Pulse (!) 102   Temp 98.4 F (36.9 C) (Oral)   Resp 17   Ht 5\' 4"  (1.626 m)   Wt 81.8 kg   SpO2 90%   BMI 30.95 kg/m  Breath Sounds:Coarse crackles O2 sats: stable throughout Patient's Current Condition: stable Specimens:  None Complications: No apparent complications Patient did tolerate procedure well.   Mcneil Sober 07/03/2018, 11:42 AM

## 2018-07-03 NOTE — Procedures (Signed)
Intubation Procedure Note Michele [name] [id number] 1946-03-14  Procedure: Intubation Indications: Respiratory insufficiency  Procedure Details Consent: Risks of procedure as well as the alternatives and risks of each were explained to the (patient/caregiver).  Consent for procedure obtained. Time Out: Verified patient identification, verified procedure, site/side was marked, verified correct patient position, special equipment/implants available, medications/allergies/relevent history reviewed, required imaging and test results available.  Performed  Maximum sterile technique was used including gloves, hand hygiene and mask.  MAC    Evaluation Hemodynamic Status: BP stable throughout; O2 sats: stable throughout Patient's Current Condition: stable Complications: No apparent complications Patient did tolerate procedure well. Chest X-ray ordered to verify placement.  CXR: pending.   Jennet Maduro 07/03/2018

## 2018-07-03 NOTE — Progress Notes (Signed)
Sela Hilding, NP, notified about hypotensive pressures with sedation. Bolus ordered. Will continue to monitor.  Donnetta Simpers, RN

## 2018-07-04 ENCOUNTER — Inpatient Hospital Stay (HOSPITAL_COMMUNITY): Payer: Medicare Other

## 2018-07-04 ENCOUNTER — Inpatient Hospital Stay: Payer: Self-pay

## 2018-07-04 DIAGNOSIS — J80 Acute respiratory distress syndrome: Secondary | ICD-10-CM

## 2018-07-04 LAB — BASIC METABOLIC PANEL
Anion gap: 6 (ref 5–15)
BUN: 23 mg/dL (ref 8–23)
CO2: 22 mmol/L (ref 22–32)
Calcium: 7.5 mg/dL — ABNORMAL LOW (ref 8.9–10.3)
Chloride: 110 mmol/L (ref 98–111)
Creatinine, Ser: 1.47 mg/dL — ABNORMAL HIGH (ref 0.44–1.00)
GFR calc Af Amer: 41 mL/min — ABNORMAL LOW (ref >60)
GFR calc non Af Amer: 35 mL/min — ABNORMAL LOW (ref >60)
Glucose, Bld: 204 mg/dL — ABNORMAL HIGH (ref 70–99)
Potassium: 3.2 mmol/L — ABNORMAL LOW (ref 3.5–5.1)
Sodium: 138 mmol/L (ref 135–145)

## 2018-07-04 LAB — POCT I-STAT 7, (LYTES, BLD GAS, ICA,H+H)
Acid-base deficit: 5 mmol/L — ABNORMAL HIGH (ref 0.0–2.0)
Acid-base deficit: 3 mmol/L — ABNORMAL HIGH (ref 0.0–2.0)
BICARBONATE: 22.3 mmol/L (ref 20.0–28.0)
Bicarbonate: 23.6 mmol/L (ref 20.0–28.0)
CALCIUM ION: 1.23 mmol/L (ref 1.15–1.40)
Calcium, Ion: 1.24 mmol/L (ref 1.15–1.40)
HCT: 21 % — ABNORMAL LOW (ref 36.0–46.0)
HCT: 23 % — ABNORMAL LOW (ref 36.0–46.0)
Hemoglobin: 7.1 g/dL — ABNORMAL LOW (ref 12.0–15.0)
Hemoglobin: 7.8 g/dL — ABNORMAL LOW (ref 12.0–15.0)
O2 SAT: 96 %
O2 Saturation: 100 %
PH ART: 7.219 — AB (ref 7.350–7.450)
POTASSIUM: 3.4 mmol/L — AB (ref 3.5–5.1)
Patient temperature: 99.3
Potassium: 3.7 mmol/L (ref 3.5–5.1)
Sodium: 146 mmol/L — ABNORMAL HIGH (ref 135–145)
Sodium: 145 mmol/L (ref 135–145)
TCO2: 24 mmol/L (ref 22–32)
TCO2: 25 mmol/L (ref 22–32)
pCO2 arterial: 54.9 mmHg — ABNORMAL HIGH (ref 32.0–48.0)
pCO2 arterial: 51.3 mmHg — ABNORMAL HIGH (ref 32.0–48.0)
pH, Arterial: 7.271 — ABNORMAL LOW (ref 7.350–7.450)
pO2, Arterial: 102 mmHg (ref 83.0–108.0)
pO2, Arterial: 249 mmHg — ABNORMAL HIGH (ref 83.0–108.0)

## 2018-07-04 LAB — CBC
HCT: 24.1 % — ABNORMAL LOW (ref 36.0–46.0)
HCT: 24.3 % — ABNORMAL LOW (ref 36.0–46.0)
Hemoglobin: 7.3 g/dL — ABNORMAL LOW (ref 12.0–15.0)
Hemoglobin: 7.3 g/dL — ABNORMAL LOW (ref 12.0–15.0)
MCH: 30.4 pg (ref 26.0–34.0)
MCH: 30.3 pg (ref 26.0–34.0)
MCHC: 30.3 g/dL (ref 30.0–36.0)
MCHC: 30 g/dL (ref 30.0–36.0)
MCV: 100.4 fL — ABNORMAL HIGH (ref 80.0–100.0)
MCV: 100.8 fL — ABNORMAL HIGH (ref 80.0–100.0)
Platelets: 108 10*3/uL — ABNORMAL LOW (ref 150–400)
Platelets: 130 10*3/uL — ABNORMAL LOW (ref 150–400)
RBC: 2.4 MIL/uL — ABNORMAL LOW (ref 3.87–5.11)
RBC: 2.41 MIL/uL — ABNORMAL LOW (ref 3.87–5.11)
RDW: 16.3 % — ABNORMAL HIGH (ref 11.5–15.5)
RDW: 16.4 % — ABNORMAL HIGH (ref 11.5–15.5)
WBC: 9.3 10*3/uL (ref 4.0–10.5)
WBC: 14.4 10*3/uL — ABNORMAL HIGH (ref 4.0–10.5)
nRBC: 0 % (ref 0.0–0.2)
nRBC: 0.3 % — ABNORMAL HIGH (ref 0.0–0.2)

## 2018-07-04 LAB — PHOSPHORUS: Phosphorus: 2.9 mg/dL (ref 2.5–4.6)

## 2018-07-04 LAB — BASIC METABOLIC PANEL WITH GFR
Anion gap: 6 (ref 5–15)
BUN: 17 mg/dL (ref 8–23)
CO2: 23 mmol/L (ref 22–32)
Calcium: 7.7 mg/dL — ABNORMAL LOW (ref 8.9–10.3)
Chloride: 117 mmol/L — ABNORMAL HIGH (ref 98–111)
Creatinine, Ser: 1.12 mg/dL — ABNORMAL HIGH (ref 0.44–1.00)
GFR calc Af Amer: 57 mL/min — ABNORMAL LOW
GFR calc non Af Amer: 49 mL/min — ABNORMAL LOW
Glucose, Bld: 124 mg/dL — ABNORMAL HIGH (ref 70–99)
Potassium: 3.4 mmol/L — ABNORMAL LOW (ref 3.5–5.1)
Sodium: 146 mmol/L — ABNORMAL HIGH (ref 135–145)

## 2018-07-04 LAB — SEDIMENTATION RATE: Sed Rate: 131 mm/hr — ABNORMAL HIGH (ref 0–22)

## 2018-07-04 LAB — GLUCOSE, CAPILLARY
GLUCOSE-CAPILLARY: 128 mg/dL — AB (ref 70–99)
Glucose-Capillary: 112 mg/dL — ABNORMAL HIGH (ref 70–99)
Glucose-Capillary: 118 mg/dL — ABNORMAL HIGH (ref 70–99)
Glucose-Capillary: 136 mg/dL — ABNORMAL HIGH (ref 70–99)
Glucose-Capillary: 182 mg/dL — ABNORMAL HIGH (ref 70–99)
Glucose-Capillary: 188 mg/dL — ABNORMAL HIGH (ref 70–99)

## 2018-07-04 LAB — LACTIC ACID, PLASMA: Lactic Acid, Venous: 1.3 mmol/L (ref 0.5–1.9)

## 2018-07-04 LAB — MAGNESIUM: Magnesium: 1.6 mg/dL — ABNORMAL LOW (ref 1.7–2.4)

## 2018-07-04 MED ORDER — FENTANYL BOLUS VIA INFUSION
50.0000 ug | INTRAVENOUS | Status: DC | PRN
  Filled 2018-07-04: qty 50

## 2018-07-04 MED ORDER — FENTANYL CITRATE (PF) 100 MCG/2ML IJ SOLN
100.0000 ug | Freq: Once | INTRAMUSCULAR | Status: AC
  Administered 2018-07-04: 100 ug via INTRAVENOUS

## 2018-07-04 MED ORDER — MIDAZOLAM HCL 2 MG/2ML IJ SOLN: 2.0000 mg | Freq: Once | INTRAMUSCULAR | Status: DC | PRN

## 2018-07-04 MED ORDER — SODIUM CHLORIDE 0.9 % IV SOLN
3.0000 ug/kg/min | INTRAVENOUS | Status: DC
  Administered 2018-07-04: 3 ug/kg/min via INTRAVENOUS
  Administered 2018-07-05: 2 ug/kg/min via INTRAVENOUS
  Administered 2018-07-05: 1.5 ug/kg/min via INTRAVENOUS
  Administered 2018-07-06: 3 ug/kg/min via INTRAVENOUS
  Filled 2018-07-04 (×5): qty 20

## 2018-07-04 MED ORDER — SODIUM CHLORIDE 0.9 % IV SOLN
2.0000 g | INTRAVENOUS | Status: DC
  Administered 2018-07-05 – 2018-07-07 (×3): 2 g via INTRAVENOUS
  Filled 2018-07-04 (×4): qty 2

## 2018-07-04 MED ORDER — FENTANYL 2500MCG IN NS 250ML (10MCG/ML) PREMIX INFUSION
0.0000 ug/h | INTRAVENOUS | Status: DC
  Administered 2018-07-04: 350 ug/h via INTRAVENOUS
  Administered 2018-07-04: 400 ug/h via INTRAVENOUS
  Administered 2018-07-05: 175 ug/h via INTRAVENOUS
  Administered 2018-07-06 – 2018-07-07 (×2): 100 ug/h via INTRAVENOUS
  Filled 2018-07-04 (×4): qty 250

## 2018-07-04 MED ORDER — CHLORHEXIDINE GLUCONATE 0.12% ORAL RINSE (MEDLINE KIT): 15.0000 mL | Freq: Two times a day (BID) | OROMUCOSAL | Status: DC

## 2018-07-04 MED ORDER — MIDAZOLAM HCL 2 MG/2ML IJ SOLN
2.0000 mg | Freq: Once | INTRAMUSCULAR | Status: AC
  Administered 2018-07-04: 2 mg via INTRAVENOUS

## 2018-07-04 MED ORDER — LACTATED RINGERS IV BOLUS
1000.0000 mL | Freq: Once | INTRAVENOUS | Status: AC
  Administered 2018-07-04: 1000 mL via INTRAVENOUS

## 2018-07-04 MED ORDER — ARTIFICIAL TEARS OPHTHALMIC OINT
1.0000 "application " | TOPICAL_OINTMENT | Freq: Three times a day (TID) | OPHTHALMIC | Status: DC
  Administered 2018-07-04 – 2018-07-07 (×10): 1 via OPHTHALMIC
  Filled 2018-07-04: qty 3.5

## 2018-07-04 MED ORDER — MAGNESIUM SULFATE 2 GM/50ML IV SOLN
2.0000 g | Freq: Once | INTRAVENOUS | Status: AC
  Administered 2018-07-04: 2 g via INTRAVENOUS
  Filled 2018-07-04: qty 50

## 2018-07-04 MED ORDER — ORAL CARE MOUTH RINSE: 15.0000 mL | OROMUCOSAL | Status: DC

## 2018-07-04 MED ORDER — NOREPINEPHRINE 4 MG/250ML-% IV SOLN
0.0000 ug/min | INTRAVENOUS | Status: DC
  Administered 2018-07-04: 6 ug/min via INTRAVENOUS
  Filled 2018-07-04: qty 250

## 2018-07-04 MED ORDER — POLYETHYLENE GLYCOL 3350 17 G PO PACK
17.0000 g | PACK | Freq: Two times a day (BID) | ORAL | Status: DC
  Administered 2018-07-05 – 2018-07-14 (×14): 17 g via ORAL
  Filled 2018-07-04 (×15): qty 1

## 2018-07-04 MED ORDER — MIDAZOLAM BOLUS VIA INFUSION
2.0000 mg | INTRAVENOUS | Status: DC | PRN
  Filled 2018-07-04: qty 2

## 2018-07-04 MED ORDER — DOCUSATE SODIUM 50 MG/5ML PO LIQD
100.0000 mg | Freq: Two times a day (BID) | ORAL | Status: DC
  Administered 2018-07-04 – 2018-07-14 (×17): 100 mg
  Filled 2018-07-04 (×17): qty 10

## 2018-07-04 MED ORDER — MIDAZOLAM 50MG/50ML (1MG/ML) PREMIX INFUSION
2.0000 mg/h | INTRAVENOUS | Status: DC
  Administered 2018-07-04: 4 mg/h via INTRAVENOUS
  Administered 2018-07-05 – 2018-07-07 (×3): 2 mg/h via INTRAVENOUS
  Filled 2018-07-04 (×6): qty 50

## 2018-07-04 MED ORDER — CISATRACURIUM BOLUS VIA INFUSION
0.1000 mg/kg | Freq: Once | INTRAVENOUS | Status: AC
  Administered 2018-07-04: 8.1 mg via INTRAVENOUS
  Filled 2018-07-04: qty 9

## 2018-07-04 MED ORDER — ATROPINE SULFATE 1 MG/10ML IJ SOSY
PREFILLED_SYRINGE | INTRAMUSCULAR | Status: AC
  Filled 2018-07-04: qty 10

## 2018-07-04 MED ORDER — SODIUM BICARBONATE 8.4 % IV SOLN
50.0000 meq | Freq: Once | INTRAVENOUS | Status: AC
  Administered 2018-07-04: 50 meq via INTRAVENOUS

## 2018-07-04 MED ORDER — EPINEPHRINE PF 1 MG/10ML IJ SOSY
PREFILLED_SYRINGE | INTRAMUSCULAR | Status: AC
  Filled 2018-07-04: qty 10

## 2018-07-04 MED ORDER — FAMOTIDINE 40 MG/5ML PO SUSR
20.0000 mg | Freq: Two times a day (BID) | ORAL | Status: DC
  Administered 2018-07-04 – 2018-07-07 (×7): 20 mg
  Filled 2018-07-04 (×6): qty 2.5

## 2018-07-04 MED ORDER — NOREPINEPHRINE 16 MG/250ML-% IV SOLN
0.0000 ug/min | INTRAVENOUS | Status: DC
  Administered 2018-07-04: 40 ug/min via INTRAVENOUS
  Administered 2018-07-04: 48 ug/min via INTRAVENOUS
  Administered 2018-07-05: 40 ug/min via INTRAVENOUS
  Administered 2018-07-06: 26 ug/min via INTRAVENOUS
  Administered 2018-07-06: 13 ug/min via INTRAVENOUS
  Administered 2018-07-07: 10 ug/min via INTRAVENOUS
  Administered 2018-07-08: 8 ug/min via INTRAVENOUS
  Administered 2018-07-09: 7 ug/min via INTRAVENOUS
  Filled 2018-07-04 (×9): qty 250

## 2018-07-04 MED ORDER — FENTANYL CITRATE (PF) 100 MCG/2ML IJ SOLN: 100.0000 ug | Freq: Once | INTRAMUSCULAR | Status: DC | PRN

## 2018-07-04 MED ORDER — SENNOSIDES 8.8 MG/5ML PO SYRP
5.0000 mL | ORAL_SOLUTION | Freq: Two times a day (BID) | ORAL | Status: DC
  Administered 2018-07-04 – 2018-07-14 (×16): 5 mL
  Filled 2018-07-04 (×17): qty 5

## 2018-07-04 MED ORDER — SODIUM BICARBONATE 8.4 % IV SOLN
INTRAVENOUS | Status: AC
  Administered 2018-07-04: 50 meq via INTRAVENOUS
  Filled 2018-07-04: qty 50

## 2018-07-04 NOTE — Progress Notes (Signed)
Long discussion with husband Dixie via telephone while in the patients room with son Roderic [place] and daughter in [employer]. Husband and son wish to make patient full code but do speak to her wishes to not be "hooked up to a machine forever." They would like to do all they can to "make her well enough to go home" but understand how gravely ill she is. Discussed this with Dr. Chase Caller and will change patient to full code.

## 2018-07-04 NOTE — Progress Notes (Addendum)
ANTICOAGULATION & ANTIBIOTIC CONSULT NOTE - Follow Up Consult  Pharmacy Consult for enoxaparin; cefepime + vancomycin Indication: pulmonary embolus; pneumonia  No Known Allergies  Patient Measurements: TBW 82.1 kg Heparin Dosing Weight: 72.5 kg  Vital Signs: Temp: 100.6 F (38.1 C) (02/15 1214) Temp Source: Axillary (02/15 1214) BP: 89/53 (02/15 1200) Pulse Rate: 105 (02/15 1200)  Labs: Recent Labs    07/06/2018 1630  07/03/18 0429 07/03/18 0556 07/03/18 1413 07/03/18 1522 07/04/18 0331  HGB 9.8*   < > 7.9* 7.9* 7.5* 7.5* 7.3*  HCT 31.3*   < > 25.9* 26.2* 22.0* 22.0* 24.1*  PLT 129*   < > 110* 107*  --   --  108*  LABPROT 31.9*  --   --   --   --   --   --   INR 3.16  --   --   --   --   --   --   CREATININE 1.34*   < > 0.94 0.96  --   --  1.12*   < > = values in this interval not displayed.    Estimated Creatinine Clearance: 46.8 mL/min (A) (by C-G formula based on SCr of 1.12 mg/dL (H)).  Assessment: 73 year old female on Xarelto s/p stroke and Afib. Transitioned to Lovenox therapeutic dosing on 2/12 when active PE found on CT Angio. Has lung cancer s/p chemo in 2019. SCr has remained stable. Hgb is trending down at 7.3 but no bleeding noted. FOB pending but no stool since 2/9.  Patient has chronic TTP and platelets remain low-stable.   Patient is also on day # 2 of vancomycin and cefepime for pneumonia. WBC is within normal limits. Tm 101. CT showing pneumonia vs lung injury from chemo and radiation for lung cancer. Culture negative to date.   Goal of Therapy:  Anti-Xa 0.6-1.0 Monitor platelets by anticoagulation protocol: Yes   Plan:  Continue enoxaparin 80mg  [place] Q12h Monitor  CBC, s/s of bleed  Reduce cefepime to 2g IV every 24 hours with current CrCl ~46 mL/min.  Continue vancomycin 1250mg  IV every 24 hours (expected AUC 485, goal 400-550; SCr 1.12) Consider if vancomycin can be discontinued with negative MRSA pcr.   Sloan Leiter, PharmD, BCPS,  BCCCP Clinical Pharmacist Please refer to [facility] for [place] numbers 07/04/2018,1:12 PM

## 2018-07-04 NOTE — Progress Notes (Signed)
Nutrition Follow-up  DOCUMENTATION CODES:   Not applicable  INTERVENTION:  - As soon as able, re-advance TF to goal of Vital AF 1.2 @ 50 ml/hr with 30 ml Prostat BID.    NUTRITION DIAGNOSIS:   Inadequate oral intake related to acute illness as evidenced by NPO status. -ongoing  GOAL:   Patient will meet greater than or equal to 90% of their needs -unmet with current TF rate.  MONITOR:   TF tolerance, Vent status, Labs, Weight trends  REASON FOR ASSESSMENT:   Consult Enteral/tube feeding initiation and management  ASSESSMENT:   73 yo female admitted with sepsis and acute respiratory due to profound multilobar pneumonia, LLL lung adenocarcinoma s/p XRT and chemo (finished November 2019). PMH includes HTN, HLD, CVA  Weight stable from yesterday. Patient remains intubated with OGT in place. She was receiving Vital AF 1.2 @ 50 ml/hr with 30 ml Prostat BID which provides 1640 kcal (93% re-estimated kcal need), 120 grams of protein, and 973 ml free water. RN at bedside and reports that patient was tolerating this regimen without issue, but that when Nimbex was started a short time ago CCM MD instructed to decrease TF rate to trickle rate of 10 ml/hr.   Per Dr. Golden Pop note today: ARDS, nimbex ordered x48 hours d/t disynchrony with the vent.   Patient is currently intubated on ventilator support MV: 11.2 L/min Temp (24hrs), Avg:99.2 F (37.3 C), Min:98.4 F (36.9 C), Max:100.6 F (38.1 C) Propofol: none  Medications reviewed; 20 mg pepcid per OGT BID, 2 g IV Mg sulfate x1 run 2/15, 10 mEq IV KCl x4 runs 2/14. Labs reviewed; CBGs: 112, 128, and 118 mg/dl today, Na: 148 mmol/l, K: 3.4 mmol/l, Cl: 117 mmol/l, creatinine: 1.12 mg/dl, Ca: 7.7 mg/dl, Mg: 1.6 mg/dl, GFR: 49 ml/min.  IVF; NS @ 125 ml/hr. Drips; fentanyl @ 400 mcg/hr, nimbex @ 3 mcg/kg/hr, levo @ 18 mcg/min, versed @ 10 mg/hr.     Diet Order:   Diet Order    None      EDUCATION NEEDS:   Not appropriate  for education at this time  Skin:  Skin Assessment: Reviewed RN Assessment  Last BM:  2/9  Height:   Ht Readings from Last 1 Encounters:  07/02/18 5\' 4"  (1.626 m)    Weight:   Wt Readings from Last 1 Encounters:  07/04/18 81.3 kg    Ideal Body Weight:     BMI:  Body mass index is 30.77 kg/m.  Estimated Nutritional Needs:   Kcal:  1759 kcal  Protein:  115-130 g  Fluid:  >/= 1.7 L     Jarome Matin, MS, RD, LDN, [facility] Inpatient Clinical Dietitian Pager # [phone] After hours/weekend pager # [phone]

## 2018-07-04 NOTE — Consult Note (Signed)
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  Patient is unable to communicate on BiPAP via nothing but nods so the history is obtained via husband.  CTA 2/12 0- The CT is positive for a single pulmonary embolus involving subsegmental vessel right lower lobe.and diffuse ALI   Past Medical History  Small cell  Significant Hospital Events   2/14 respiratory failure  Consults:  PCCM  Procedures:  ETT 2/14>>>  Significant Diagnostic Tests:  CT of the chest with diffuse infiltrate  Micro Data:  Blood 2/14>>> Urine 2/14>>> Sputum 2/14>>> BAL 2/14>>> RV  21/12 - neg Urine srep negatife  Antimicrobials:  Cefepime 2/14>>> Vancomycin 2/14>>>   Interim history/subjective:   07/04/2018 - 1005 fio2/peep 5 . Fever 101F WBC 10. Dysnch with vent. On fent gtt. Not on pressors. ECHO 07/02/2018 - ef 65%. Has new AKI creat 1.12. PCT 0.5 and borderline  Husband at bedside - says aug/sept 2019 Icard made dx of small cell. Rx at [facility] with platin/taxol Rx and 33d of XRT to lung. Dec 2019 CT chest clear but for ? Mild fibrosis at lung based, Jan 2020 CT - new ggo scatterd. Feb 2020 now worse GGO with some fibrotic hchanges and worse  Objective   Blood pressure (!) 93/59, pulse (!) 107, temperature 98.7 F (37.1 C), temperature source Axillary, resp. rate 14, height _0  (1.626 m), weight 81.3 kg, SpO2 90 %.    Vent Mode: PRVC FiO2 (%):  [80 %-100 %] 100 % Set Rate:  [30 bmp] 30 bmp Vt Set:  [430 mL] 430 mL PEEP:  [5 cmH20] 5 cmH20 Plateau Pressure:  [19 cmH20-28 cmH20] 28 cmH20   Intake/Output  Summary (Last 24 hours) at 07/04/2018 1203 Last data filed at 07/04/2018 1100 Gross per 24 hour  Intake 5031.08 ml  Output 650 ml  Net 4381.08 ml   Filed Weights   07/02/18 0203 07/04/18 0457  Weight: 81.8 kg 81.3 kg    General Appearance:  Looks criticall ill. Alopecia + Head:  Normocephalic, without obvious abnormality, atraumatic Eyes:  PERRL - yes, conjunctiva/corneas - clear     Ears:  Normal external ear canals, both ears Nose:  G tube - no Throat:  ETT TUBE - yes , OG tube - yes Neck:  Supple,  No enlargement/tenderness/nodules Lungs: Clear to auscultation bilaterally, Ventilator   Synchrony - no, dysnch, 100% fip2, peep 5 Heart:  S1 and S2 normal, no murmur, CVP - no.  Pressors - no Abdomen:  Soft, no masses, no organomegaly Genitalia / Rectal:  Not done Extremities:  Extremities- intact Skin:  ntact in exposed areas . Sacral area - no Neurologic:  Sedation - fent gtt -> RASS - -3       LABS    PULMONARY Recent Labs  Lab 06/24/2018 1857 06/27/2018 2003 07/02/18 0256 07/03/18 1225 07/03/18 1413 07/03/18 1522  PHART  --  7.393 7.439 7.181* 7.261* 7.319*  PCO2ART  --  37.9 36.5 64.1* 52.0* 44.3  PO2ART  --  62.0* 173.0* 102 88.0 133.0*  HCO3 27.1 23.1 24.4 23.0 23.4 22.8  TCO2  _0 --  25 24  O2SAT 78.0 91.0 100.0 95.5 95.0 99.0    CBC Recent Labs  Lab 07/03/18 0429 07/03/18 0556 07/03/18 1413 07/03/18 1522 07/04/18 0331  HGB 7.9* 7.9* 7.5* 7.5* 7.3*  HCT 25.9* 26.2* 22.0* 22.0* 24.1*  WBC 8.9 9.7  --   --  9.3  PLT 110* 107*  --   --  108*    COAGULATION Recent Labs  Lab 06/26/2018 1630  INR 3.16    CARDIAC  No results for input(s): TROPONINI in the last 168 hours. No results for input(s): PROBNP in the last 168 hours.   CHEMISTRY Recent Labs  Lab 07/15/2018 1630  07/02/18 0228  07/03/18 0429 07/03/18 0556 07/03/18 1413 07/03/18 1522 07/03/18 1801 07/04/18 0331  NA 140   < > 142   < > 145 145 144 144  --  146*  K 3.7   < >  3.3*   < > 2.9* 3.0* 3.5 3.5  --  3.4*  CL 100  --  112*  --  113* 109  --   --   --  117*  CO2 25  --  20*  --  22 23  --   --   --  23  GLUCOSE 144*  --  128*  --  88 88  --   --   --  124*  BUN 17  --  12  --  9 9  --   --   --  17  CREATININE 1.34*  --  0.93  --  0.94 0.96  --   --   --  1.12*  CALCIUM 8.5*  --  7.6*  --  7.7* 7.7*  --   --   --  7.7*  MG  --   --   --   --  1.8  --   --   --  1.8 1.6*  PHOS  --   --   --   --   --   --   --   --  3.8 2.9   < > = values in this interval not displayed.   Estimated Creatinine Clearance: 46.8 mL/min (A) (by C-G formula based on SCr of 1.12 mg/dL (H)).   LIVER Recent Labs  Lab 06/29/2018 1630 07/02/18 0228 07/03/18 0429  AST _1 ALT _2 ALKPHOS 82 93 131*  BILITOT 1.3* 1.5* 1.8*  PROT 6.2* 5.7* 5.2*  ALBUMIN 2.5* 2.3* 1.9*  INR 3.16  --   --      INFECTIOUS Recent Labs  Lab 07/14/2018 1630 06/20/2018 1850 06/20/2018 2255 07/02/18 0228 07/03/18 0429  LATICACIDVEN 2.2* 1.2 0.9  --   --   PROCALCITON  --   --   --  0.39 0.53     ENDOCRINE CBG (last 3)  Recent Labs    07/03/18 2312 07/04/18 0336 07/04/18 0819  GLUCAP 99 112* 128*         IMAGING x48h  - image(s) personally visualized  -   highlighted in bold Dg Chest Port 1 View  Result Date: 07/03/2018 CLINICAL DATA:  Endotracheal tube placement. EXAM: PORTABLE CHEST 1 VIEW COMPARISON:  Radiographs and CT 06/30/2018. Other chest radiographs over the last month. FINDINGS: 1249 hours. Interval intubation. Tip of the endotracheal tube is in the mid trachea. Enteric tube projects below the diaphragm, tip not visualized. Right subclavian Port-A-Cath tip is at the superior cavoatrial junction. The heart  size and mediastinal contours are stable. There has been slight worsening in the diffuse bilateral airspace opacities. Small bilateral pleural effusions appear unchanged. No pneumothorax. IMPRESSION: 1. Satisfactory position of the endotracheal and enteric  tubes. 2. Slight worsening in bilateral airspace opacities consistent with pneumonia. Electronically Signed   By: Richardean Sale M.D.   On: 07/03/2018 13:23     Resolved Hospital Problem list   N/A  Assessment & Plan:  ASSESSMENT / PLAN:  PULMONARY A:  Acute severe hypoxemic resp failure ARDS physiology - concerning for viral hit and run from Jan 2020 or Chemo.XRt lung injury. Less likely is HCAP. On 2/12 - also single vessel PE    07/04/2018 -> dysnch with vent. 100% fio2  P:   ARDS protocol Nimbex x 48h Cotniue lovenox Rx dose  CARDIOVASCULAR A:   Maintains BP/HR  P:  MAP goal > 65  RENAL A:  At risk AKI P:  Monitor Place PICC  ELECTROLYTES A:  hypomagnesemia P: replete   GASTROINTESTINAL A:   NPO  P:   Start TF  HEMATOLOGIC A:  Anemia Mild thrombocytopenia - improving (88 on 06/26/2018)   P:  - PRBC for hgb </= 6.9gm%    - exceptions are   -  if ACS susepcted/confirmed then transfuse for hgb </= 8.0gm%,  or    -  active bleeding with hemodynamic instability, then transfuse regardless of hemoglobin value   At at all times try to transfuse 1 unit prbc as possible with exception of active hemorrhage  - Lvenox Rx dose    INFECTIOUS A:   Being Rx for HCAP - PCT is borderline P:   Anti-infectives (From admission, onward)   Start     Dose/Rate Route Frequency Ordered Stop   07/03/18 1045  ceFEPIme (MAXIPIME) 2 g in sodium chloride 0.9 % 100 mL IVPB     2 g 200 mL/hr over 30 Minutes Intravenous Every 12 hours 07/03/18 1030     07/03/18 0600  vancomycin (VANCOCIN) IVPB 1000 mg/200 mL premix  Status:  Discontinued     1,000 mg 200 mL/hr over 60 Minutes Intravenous Every 36 hours 06/27/2018 2030 07/02/18 1001   07/02/18 1800  vancomycin (VANCOCIN) 1,250 mg in sodium chloride 0.9 % 250 mL IVPB     1,250 mg 166.7 mL/hr over 90 Minutes Intravenous Every 24 hours 07/02/18 1001     07/02/18 1600  ceFEPIme (MAXIPIME) 2 g in sodium chloride 0.9 % 100 mL  IVPB  Status:  Discontinued     2 g 200 mL/hr over 30 Minutes Intravenous Every 24 hours 06/22/2018 2026 07/03/18 1030   07/07/2018 1615  vancomycin (VANCOCIN) 1,500 mg in sodium chloride 0.9 % 500 mL IVPB     1,500 mg 250 mL/hr over 120 Minutes Intravenous  Once 07/07/2018 1604 06/30/2018 2013   06/30/2018 1600  vancomycin (VANCOCIN) IVPB 1000 mg/200 mL premix  Status:  Discontinued     1,000 mg 200 mL/hr over 60 Minutes Intravenous  Once 07/08/2018 1559 06/24/2018 1604   07/05/2018 1600  ceFEPIme (MAXIPIME) 2 g in sodium chloride 0.9 % 100 mL IVPB     2 g 200 mL/hr over 30 Minutes Intravenous  Once 06/30/2018 1559 07/04/2018 1712        ENDOCRINE A:   At risk hyperglycemia   P:   Insulin protocol  NEUROLOGIC A:   Need for deep sedation P:   RASS -5 with bIs < 60 for nimbex  GLOBAL criticall ill Keep in  ICU Husband updated     [place]   The patient Adithi B Fantroy is critically ill with multiple organ systems failure and requires high complexity decision making for assessment and support, frequent evaluation and titration of therapies, application of advanced monitoring technologies and extensive interpretation of multiple databases.   Critical Care Time devoted to patient care services described in this note is  30  Minutes. This time reflects time of care of this signee Dr Brand Males. This critical care time does not reflect procedure time, or teaching time or supervisory time of PA/NP/Med student/Med Resident etc but could involve care discussion time     Dr. Brand Males, M.D., [facility].C.P Pulmonary and Critical Care Medicine Staff Physician [place] [facility] Pager: [phone], If no answer or between  15:00h - 7:00h: call [phone]  07/04/2018 12:54 PM

## 2018-07-04 NOTE — Progress Notes (Signed)
Concern for cuff leak Examined with glidescope and noteiced ET tube was advanced proximally with balloon above the cords  Deflated balloon and reinserted et tube and inflated balloon and leak resolved  Get cxr    SIGNATURE    Dr. Brand Males, M.D., F.C.C.P,  Pulmonary and Critical Care Medicine Staff Physician, [place] Director - Interstitial Lung Disease  Program  Pulmonary [place] at [place], [place], [address]  Pager: [phone], If no answer or between  15:00h - 7:00h: call [phone] Telephone: [phone]  4:34 PM 07/04/2018

## 2018-07-04 NOTE — Progress Notes (Addendum)
Dr. Jimmy Footman notified about vent settings and I:E ratio.  Orders given to change pt's VT to 350 and to keep the rate at 35.

## 2018-07-04 NOTE — Progress Notes (Signed)
Pt had audible cuff leak so MD looked with glidescope to check ETT.  MD advanced ETT to 24cm at lip.  CXR confirms good placement.  NP looked at CXR and confirmed placement.  RT will continue to monitor.

## 2018-07-05 ENCOUNTER — Inpatient Hospital Stay (HOSPITAL_COMMUNITY): Payer: Medicare Other

## 2018-07-05 LAB — COMPREHENSIVE METABOLIC PANEL
ALT: 9 U/L (ref 0–44)
AST: 16 U/L (ref 15–41)
Albumin: 1.5 g/dL — ABNORMAL LOW (ref 3.5–5.0)
Alkaline Phosphatase: 129 U/L — ABNORMAL HIGH (ref 38–126)
Anion gap: 11 (ref 5–15)
BILIRUBIN TOTAL: 0.9 mg/dL (ref 0.3–1.2)
BUN: 26 mg/dL — ABNORMAL HIGH (ref 8–23)
CO2: 17 mmol/L — ABNORMAL LOW (ref 22–32)
Calcium: 8.1 mg/dL — ABNORMAL LOW (ref 8.9–10.3)
Chloride: 118 mmol/L — ABNORMAL HIGH (ref 98–111)
Creatinine, Ser: 1.6 mg/dL — ABNORMAL HIGH (ref 0.44–1.00)
GFR calc Af Amer: 37 mL/min — ABNORMAL LOW (ref >60)
GFR calc non Af Amer: 32 mL/min — ABNORMAL LOW (ref >60)
Glucose, Bld: 188 mg/dL — ABNORMAL HIGH (ref 70–99)
Potassium: 4 mmol/L (ref 3.5–5.1)
Sodium: 146 mmol/L — ABNORMAL HIGH (ref 135–145)
Total Protein: 5.3 g/dL — ABNORMAL LOW (ref 6.5–8.1)

## 2018-07-05 LAB — CBC WITH DIFFERENTIAL/PLATELET
Abs Immature Granulocytes: 0.28 10*3/uL — ABNORMAL HIGH (ref 0.00–0.07)
Basophils Absolute: 0.1 10*3/uL (ref 0.0–0.1)
Basophils Relative: 0 %
Eosinophils Absolute: 1.6 10*3/uL — ABNORMAL HIGH (ref 0.0–0.5)
Eosinophils Relative: 11 %
HEMATOCRIT: 24.3 % — AB (ref 36.0–46.0)
Hemoglobin: 7.2 g/dL — ABNORMAL LOW (ref 12.0–15.0)
Immature Granulocytes: 2 %
LYMPHS ABS: 0.9 10*3/uL (ref 0.7–4.0)
LYMPHS PCT: 7 %
MCH: 29.8 pg (ref 26.0–34.0)
MCHC: 29.6 g/dL — ABNORMAL LOW (ref 30.0–36.0)
MCV: 100.4 fL — AB (ref 80.0–100.0)
Monocytes Absolute: 1.5 10*3/uL — ABNORMAL HIGH (ref 0.1–1.0)
Monocytes Relative: 11 %
Neutro Abs: 10 10*3/uL — ABNORMAL HIGH (ref 1.7–7.7)
Neutrophils Relative %: 69 %
Platelets: 129 10*3/uL — ABNORMAL LOW (ref 150–400)
RBC: 2.42 MIL/uL — ABNORMAL LOW (ref 3.87–5.11)
RDW: 16.5 % — ABNORMAL HIGH (ref 11.5–15.5)
WBC: 14.4 10*3/uL — ABNORMAL HIGH (ref 4.0–10.5)
nRBC: 0.6 % — ABNORMAL HIGH (ref 0.0–0.2)

## 2018-07-05 LAB — PHOSPHORUS: Phosphorus: 3.4 mg/dL (ref 2.5–4.6)

## 2018-07-05 LAB — POCT I-STAT 7, (LYTES, BLD GAS, ICA,H+H)
Acid-base deficit: 3 mmol/L — ABNORMAL HIGH (ref 0.0–2.0)
Bicarbonate: 23.7 mmol/L (ref 20.0–28.0)
Calcium, Ion: 1.24 mmol/L (ref 1.15–1.40)
HCT: 23 % — ABNORMAL LOW (ref 36.0–46.0)
Hemoglobin: 7.8 g/dL — ABNORMAL LOW (ref 12.0–15.0)
O2 Saturation: 100 %
Potassium: 4 mmol/L (ref 3.5–5.1)
SODIUM: 145 mmol/L (ref 135–145)
TCO2: 25 mmol/L (ref 22–32)
pCO2 arterial: 54.1 mmHg — ABNORMAL HIGH (ref 32.0–48.0)
pH, Arterial: 7.25 — ABNORMAL LOW (ref 7.350–7.450)
pO2, Arterial: 290 mmHg — ABNORMAL HIGH (ref 83.0–108.0)

## 2018-07-05 LAB — CULTURE, BAL-QUANTITATIVE

## 2018-07-05 LAB — HEPATIC FUNCTION PANEL
ALT: 8 U/L (ref 0–44)
AST: 15 U/L (ref 15–41)
Albumin: 1.5 g/dL — ABNORMAL LOW (ref 3.5–5.0)
Alkaline Phosphatase: 138 U/L — ABNORMAL HIGH (ref 38–126)
Bilirubin, Direct: 0.4 mg/dL — ABNORMAL HIGH (ref 0.0–0.2)
Indirect Bilirubin: 0.5 mg/dL (ref 0.3–0.9)
Total Bilirubin: 0.9 mg/dL (ref 0.3–1.2)
Total Protein: 5.2 g/dL — ABNORMAL LOW (ref 6.5–8.1)

## 2018-07-05 LAB — MAGNESIUM: Magnesium: 2.5 mg/dL — ABNORMAL HIGH (ref 1.7–2.4)

## 2018-07-05 LAB — RHEUMATOID FACTOR: Rheumatoid fact SerPl-aCnc: 14.9 IU/mL — ABNORMAL HIGH (ref 0.0–13.9)

## 2018-07-05 LAB — GLUCOSE, CAPILLARY
GLUCOSE-CAPILLARY: 148 mg/dL — AB (ref 70–99)
Glucose-Capillary: 160 mg/dL — ABNORMAL HIGH (ref 70–99)
Glucose-Capillary: 169 mg/dL — ABNORMAL HIGH (ref 70–99)
Glucose-Capillary: 133 mg/dL — ABNORMAL HIGH (ref 70–99)
Glucose-Capillary: 133 mg/dL — ABNORMAL HIGH (ref 70–99)

## 2018-07-05 LAB — PROTIME-INR
INR: 1.47
Prothrombin Time: 17.6 seconds — ABNORMAL HIGH (ref 11.4–15.2)

## 2018-07-05 LAB — MPO/PR-3 (ANCA) ANTIBODIES
ANCA Proteinase 3: <3.5 U/mL (ref 0.0–3.5)
Myeloperoxidase Abs: <9 U/mL (ref 0.0–9.0)

## 2018-07-05 LAB — CULTURE, BAL-QUANTITATIVE W GRAM STAIN: Culture: NO GROWTH

## 2018-07-05 LAB — TROPONIN I: Troponin I: 0.06 ng/mL (ref <0.03)

## 2018-07-05 MED ORDER — LACTATED RINGERS IV BOLUS
2000.0000 mL | Freq: Once | INTRAVENOUS | Status: AC
  Administered 2018-07-05: 1000 mL via INTRAVENOUS

## 2018-07-05 MED ORDER — INSULIN ASPART 100 UNIT/ML ~~LOC~~ SOLN
0.0000 [IU] | SUBCUTANEOUS | Status: DC
  Administered 2018-07-05: 2 [IU] via SUBCUTANEOUS
  Administered 2018-07-05 (×3): 1 [IU] via SUBCUTANEOUS
  Administered 2018-07-06 (×2): 2 [IU] via SUBCUTANEOUS
  Administered 2018-07-06 (×3): 1 [IU] via SUBCUTANEOUS
  Administered 2018-07-07: 7 [IU] via SUBCUTANEOUS
  Administered 2018-07-07 (×2): 5 [IU] via SUBCUTANEOUS

## 2018-07-05 MED ORDER — SODIUM CHLORIDE 0.9 % IV SOLN
1.2500 ng/kg/min | INTRAVENOUS | Status: DC
  Administered 2018-07-05: 5 ng/kg/min via INTRAVENOUS
  Filled 2018-07-05: qty 1

## 2018-07-05 MED ORDER — CHLORHEXIDINE GLUCONATE CLOTH 2 % EX PADS
6.0000 | MEDICATED_PAD | Freq: Every day | CUTANEOUS | Status: DC
  Administered 2018-07-07 – 2018-07-16 (×7): 6 via TOPICAL

## 2018-07-05 MED ORDER — SODIUM CHLORIDE 0.9% FLUSH
10.0000 mL | Freq: Two times a day (BID) | INTRAVENOUS | Status: DC
  Administered 2018-07-05 – 2018-07-15 (×18): 10 mL

## 2018-07-05 MED ORDER — POTASSIUM CHLORIDE 10 MEQ/50ML IV SOLN
10.0000 meq | INTRAVENOUS | Status: AC
  Administered 2018-07-05 (×6): 10 meq via INTRAVENOUS
  Filled 2018-07-05 (×6): qty 50

## 2018-07-05 MED ORDER — VASOPRESSIN 20 UNIT/ML IV SOLN
0.0300 [IU]/min | INTRAVENOUS | Status: DC
  Administered 2018-07-05: 0.03 [IU]/min via INTRAVENOUS
  Filled 2018-07-05 (×2): qty 2

## 2018-07-05 MED ORDER — SODIUM CHLORIDE 0.9% FLUSH
10.0000 mL | INTRAVENOUS | Status: DC | PRN
  Administered 2018-07-09 – 2018-07-10 (×2): 10 mL
  Filled 2018-07-05 (×2): qty 40

## 2018-07-05 NOTE — Progress Notes (Signed)
Peripherally Inserted Central Catheter/Midline Placement  The IV Nurse has discussed with the patient and/or persons authorized to consent for the patient, the purpose of this procedure and the potential benefits and risks involved with this procedure.  The benefits include less needle sticks, lab draws from the catheter, and the patient may be discharged home with the catheter. Risks include, but not limited to, infection, bleeding, blood clot (thrombus formation), and puncture of an artery; nerve damage and irregular heartbeat and possibility to perform a PICC exchange if needed/ordered by physician.  Alternatives to this procedure were also discussed.  B[facility] Power PICC patient education guide, fact sheet on infection prevention and patient information card has been provided to patient /or left at bedside. Consent signed by spouse, as patient is sedated and on ventilator.  PICC/Midline Placement Documentation  PICC Double Lumen 03/52/48 PICC Left Basilic 40 cm (Active)  Indication for Insertion or Continuance of Line Administration of hyperosmolar/irritating solutions (i.e. TPN, Vancomycin, etc.) 07/05/2018 10:00 AM  Site Assessment Clean;Dry;Intact 07/05/2018 10:00 AM  Lumen #1 Status Flushed;Blood return noted;Saline locked 07/05/2018 10:00 AM  Lumen #2 Status Flushed;Blood return noted;Saline locked 07/05/2018 10:00 AM  Dressing Type Transparent 07/05/2018 10:00 AM  Dressing Status Clean;Dry;Intact;Antimicrobial disc in place 07/05/2018 10:00 AM  Line Care Connections checked and tightened 07/05/2018 10:00 AM  Dressing Change Due 07/12/18 07/05/2018 10:00 AM       Lorenza [place] 07/05/2018, 10:34 AM

## 2018-07-05 NOTE — Progress Notes (Addendum)
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  Patient is unable to communicate on BiPAP via nothing but nods so the history is obtained via husband.  CTA 2/12 0- The CT is positive for a single pulmonary embolus involving subsegmental vessel right lower lobe.and diffuse ALI   Past Medical History  Small cell  Significant Hospital Events    2/13 - echo - diast dysfn, EF 65% 2/14 respiratory failure 2/15 -  1005 fio2/peep 5 . Fever 101F WBC 10. Dysnch with vent. On fent gtt. Not on pressors. ECHO 07/02/2018 - ef 65%. Has new AKI creat 1.12. PCT 0.5 and borderline  Husband at bedside - says aug/sept 2019 Icard made dx of small cell. Rx at [facility] with platin/taxol Rx and 33d of XRT to lung. Dec 2019 CT chest clear but for ? Mild fibrosis at lung based, Jan 2020 CT - new ggo scatterd. Feb 2020 now worse GGO with some fibrotic hchanges and worse  Consults:  PCCM  Procedures:  Portocath long standing ETT 2/14>>>  Significant Diagnostic Tests:  CT of the chest with diffuse infiltrate  Micro Data:  Blood 2/14>>> Urine 2/14>>> Sputum 2/14>>> BAL 2/14>>> RV  21/12 - neg Urine srep negatife MRSA PCR - negative  Antimicrobials:  Cefepime 2/14>>> Vancomycin 2/14>>>   Interim history/subjective:   07/05/2018 - aRDS protocol with d1 completing of nimbex. Currently on 60% fio2/peep 10 -> pulse ox 96%, levophed 37, vaso 0.03, fent gtt, versed gtt, nimbex gtt. Urine strep - negative. RVP neg. Pressor needs was after nimbex. Hsban changed to full code but told RN - no LTAC/no PEG/no TRach. IV team  refused picc line. Ur op dropped  ESR 131  Objective   Blood pressure (!) 97/47, pulse 62, temperature 97.7 F (36.5 C), temperature source Oral, resp. rate (!) 35, height 5' 4.02" (1.626 m), weight 85.4 kg, SpO2 100 %.    Vent Mode: PRVC FiO2 (%):  [100 %] 100 % Set Rate:  [30 bmp-35 bmp] 35 bmp Vt Set:  [350 mL-430 mL] 350 mL PEEP:  [5 cmH20-10 cmH20] 10 cmH20 Plateau Pressure:  [20 cmH20-39 cmH20] 31 cmH20   Intake/Output Summary (Last 24 hours) at 07/05/2018 0747 Last data filed at 07/05/2018 0700 Gross per 24 hour  Intake 5023.67 ml  Output 457 ml  Net 4566.67 ml   Filed Weights   07/02/18 0203 07/04/18 0457 07/05/18 0500  Weight: 81.8 kg 81.3 kg 85.4 kg     General Appearance:  Looks criticall ill OBESE - yes Head:  Normocephalic, without obvious abnormality, atraumatic Eyes:  PERRL - yes, conjunctiva/corneas - clear     Ears:  Normal external ear canals, both ears Nose:  G tube - no Throat:  ETT TUBE - yes , OG tube - yes Neck:  Supple,  No enlargement/tenderness/nodules Lungs: Clear to auscultation bilaterally, Ventilator   Synchrony - yes Heart:  S1 and S2 normal, no murmur, CVP - x.  Pressors - levophed and vasoperssin Abdomen:  Soft, no masses, no organomegaly Genitalia / Rectal:  Not done Extremities:  Extremities- intact Skin:  ntact  in exposed areas . Sacral area - not examined Neurologic:  Sedation - fent gttt, versed gtt, nimbex gtt -> RASS - -4/BIS 25          LABS    PULMONARY Recent Labs  Lab 07/03/18 1413 07/03/18 1522 07/04/18 1620 07/04/18 2116 07/05/18 0406  PHART 7.261* 7.319* 7.219* 7.271* 7.250*  PCO2ART 52.0* 44.3 54.9* 51.3* 54.1*  PO2ART 88.0 133.0* 102.0 249.0* 290.0*  HCO3 23.4 22.8 22.3 23.6 23.7  TCO2 _0 O2SAT 95.0 99.0 96.0 100.0 100.0    CBC Recent Labs  Lab 07/04/18 0331  07/04/18 2325 07/05/18 0404 07/05/18 0406  HGB 7.3*   < > 7.3* 7.2* 7.8*  HCT 24.1*   < > 24.3* 24.3* 23.0*  WBC 9.3  --   14.4* 14.4*  --   PLT 108*  --  130* 129*  --    < > = values in this interval not displayed.    COAGULATION Recent Labs  Lab 06/25/2018 1630 07/05/18 0404  INR 3.16 1.47    CARDIAC   Recent Labs  Lab 07/05/18 0404  TROPONINI 0.06*   No results for input(s): PROBNP in the last 168 hours.   CHEMISTRY Recent Labs  Lab 07/02/18 0228  07/03/18 0429 07/03/18 0556  07/03/18 1801 07/04/18 0331 07/04/18 1620 07/04/18 2116 07/04/18 2325 07/05/18 0404 07/05/18 0406  NA 142   < > 145 145   < >  --  146* 146* 145 138  --  145  K 3.3*   < > 2.9* 3.0*   < >  --  3.4* 3.7 3.4* 3.2*  --  4.0  CL 112*  --  113* 109  --   --  117*  --   --  110  --   --   CO2 20*  --  22 23  --   --  23  --   --  22  --   --   GLUCOSE 128*  --  88 88  --   --  124*  --   --  204*  --   --   BUN 12  --  9 9  --   --  17  --   --  23  --   --   CREATININE 0.93  --  0.94 0.96  --   --  1.12*  --   --  1.47*  --   --   CALCIUM 7.6*  --  7.7* 7.7*  --   --  7.7*  --   --  7.5*  --   --   MG  --   --  1.8  --   --  1.8 1.6*  --   --   --  2.5*  --   PHOS  --   --   --   --   --  3.8 2.9  --   --   --  3.4  --    < > = values in this interval not displayed.   Estimated Creatinine Clearance: 36.6 mL/min (A) (by C-G formula based on SCr of 1.47 mg/dL (H)).   LIVER Recent Labs  Lab 07/07/2018 1630 07/02/18 0228 07/03/18 0429 07/05/18 0404  AST _1 ALT _2 ALKPHOS 82 93 131* 138*  BILITOT 1.3* 1.5* 1.8* 0.9  PROT 6.2* 5.7* 5.2* 5.2*  ALBUMIN 2.5* 2.3* 1.9* 1.5*  INR 3.16  --   --  1.[place]  Lab 07/08/2018 1850 07/12/2018 2255 07/02/18 0228 07/03/18 0429 07/04/18 2325  LATICACIDVEN 1.2 0.9  --   --  1.3  PROCALCITON  --   --  0.39 0.53  --      ENDOCRINE CBG (last 3)  Recent Labs    07/04/18 2009 07/04/18 2321 07/05/18 0357  GLUCAP 182* 188* 160*         IMAGING x48h  - image(s) personally visualized  -   highlighted in bold Dg Chest  Port 1 View  Result Date: 07/04/2018 CLINICAL DATA:  ARDS. EXAM: PORTABLE CHEST 1 VIEW COMPARISON:  Radiographs 07/03/2018 and 06/27/2018.  CT 07/13/2018. FINDINGS: 1305 hours. Endotracheal tube has been partially withdrawn, now lying 6.7 cm above the carina, but below the thoracic inlet. Enteric tube projects below the diaphragm, tip not visualized. Right subclavian Port-A-Cath is unchanged at the lower cavoatrial junction level. The heart size and mediastinal contours are stable. Diffuse bilateral airspace opacities are again noted with slight worsening in the left perihilar region. There is no pneumothorax or significant pleural effusion. IMPRESSION: Slight worsening of the left perihilar component of the bilateral airspace opacities consistent with ARDS/pneumonia. No pneumothorax. Satisfactory position of the support system. Electronically Signed   By: Richardean Sale M.D.   On: 07/04/2018 15:35   Dg Chest Port 1 View  Result Date: 07/03/2018 CLINICAL DATA:  Endotracheal tube placement. EXAM: PORTABLE CHEST 1 VIEW COMPARISON:  Radiographs and CT 06/26/2018. Other chest radiographs over the last month. FINDINGS: 1249 hours. Interval intubation. Tip of the endotracheal tube is in the mid trachea. Enteric tube projects below the diaphragm, tip not visualized. Right subclavian Port-A-Cath tip is at the superior cavoatrial junction. The heart size and mediastinal contours are stable. There has been slight worsening in the diffuse bilateral airspace opacities. Small bilateral pleural effusions appear unchanged. No pneumothorax. IMPRESSION: 1. Satisfactory position of the endotracheal and enteric tubes. 2. Slight worsening in bilateral airspace opacities consistent with pneumonia. Electronically Signed   By: Richardean Sale M.D.   On: 07/03/2018 13:23   Dg Chest Port 1v Same Day  Result Date: 07/04/2018 CLINICAL DATA:  Intubation EXAM: PORTABLE CHEST 1 VIEW COMPARISON:  Portable exam 1632 hours compared to  07/04/2018 at 1305 hours FINDINGS: Tip of endotracheal tube projects 3.5 cm above carina. Nasogastric tube extends into stomach. RIGHT subclavian Port-A-Cath with tip projecting over SVC. Normal heart size mediastinal contours. Atherosclerotic calcifications aorta. Diffuse BILATERAL pulmonary infiltrates which could represent pneumonia or ARDS. Minimal RIGHT pleural effusion. No pneumothorax. Bones demineralized. IMPRESSION: Persistent diffuse BILATERAL pulmonary infiltrates question pneumonia versus ARDS. Electronically Signed   By: Lavonia Dana M.D.   On: 07/04/2018 17:16   Dg Abd Portable 1v  Result Date: 07/04/2018 CLINICAL DATA:  Orogastric tube placement EXAM: PORTABLE ABDOMEN - 1 VIEW COMPARISON:  Portable exam 1640 hours compared to CT abdomen and pelvis of 06/27/2018 FINDINGS: Tip of orogastric tube projects over distal gastric or pylorus. Paucity of bowel gas in upper abdomen. Severe diffuse BILATERAL pulmonary infiltrates. Osseous structures unremarkable. IMPRESSION: Tip of nasogastric tube projects over distal gastric antrum or pylorus. Electronically Signed   By: Lavonia Dana M.D.   On: 07/04/2018 17:17   [place] Ekg Site Rite  Result Date: 07/04/2018 If Site Rite image not attached, placement could not be confirmed due to current cardiac rhythm.    Resolved Hospital Problem list   N/A  Assessment & Plan:  ASSESSMENT / PLAN:  PULMONARY  A:  Acute severe hypoxemic resp failure ARDS physiology - concerning for viral hit and run from Jan 2020 or Chemo/XRt lung injury. Less likely is HCAP. On 2/12 - also single vessel PE    07/05/2018 ->ARDS protocol since 07/04/2008 with nimbex. ESR 135. Hypoxemia some better  P:   ARDS protocol Nimbex x 48h since 07/04/2018 Cotniue lovenox Rx dose Continue empiric antibitotics If culture negative 07/06/2018 and ALI not further better, consider steroids for dx of chemo/XRT lung injury (ESR 135) but with fact she is on nimbex on mind  CARDIOVASCULAR A:    07/05/2018 - In Circulatory shock  - on high dose levophed and shock vasopressin   P:  MAP goal > 65;  Levophed and vasopressin Add giaprezza Might need separate CVL    RENAL A:  At risk AKI  2/16 - creat pending. Ur op pending  P:  Monitor; await morning bmet Fluid bolus 2L LR Re-ask IV team about placing PICC  ELECTROLYTES A:  Normal 2/16/20202 P: replete   GASTROINTESTINAL A:   NPO  P:   Continue TF   HEMATOLOGIC A:  Anemia Mild thrombocytopenia - improving (88 on 06/26/2018) -  - on Rx dose lovenox for single vessel PE 07/06/2018  P:  - PRBC for hgb </= 6.9gm%    - exceptions are   -  if ACS susepcted/confirmed then transfuse for hgb </= 8.0gm%,  or    -  active bleeding with hemodynamic instability, then transfuse regardless of hemoglobin value   At at all times try to transfuse 1 unit prbc as possible with exception of active hemorrhage  - Lvenox Rx dose    INFECTIOUS A:   Being Rx for HCAP - PCT is borderline P:   abx as above    ENDOCRINE A:   At risk hyperglycemia   P:   Insulin protocol  NEUROLOGIC A:   Need for deep sedation  2/16- BIS < 30 with fent versed and nimbex since 07/04/2018  P:   RASS -5 with bIs < 60 for nimbex to continue    GLOBAL Nutn: TF DVT proph: Lovenox  GI proph: pepcid Family: husband updated Goals: Full code but no LTAC, trach, peg Disp: keep in ICU       ATTESTATION & SIGNATURE   The patient Michele [name] is critically ill with multiple organ systems failure and requires high complexity decision making for assessment and support, frequent evaluation and titration of therapies, application of advanced monitoring technologies and extensive interpretation of multiple databases.   Critical Care Time devoted to patient care services described in this note is  30  Minutes. This time reflects time of care of this signee Dr Brand Males. This critical care time does not reflect procedure time, or  teaching time or supervisory time of PA/NP/Med student/Med Resident etc but could involve care discussion time     Dr. Brand Males, M.D., [facility].C.P Pulmonary and Critical Care Medicine Staff Physician [facility] Pager: [phone], If no answer or between  15:00h - 7:00h: call [phone]  07/05/2018 8:09 AM

## 2018-07-06 ENCOUNTER — Inpatient Hospital Stay (HOSPITAL_COMMUNITY): Payer: Medicare Other

## 2018-07-06 DIAGNOSIS — N179 Acute kidney failure, unspecified: Secondary | ICD-10-CM

## 2018-07-06 DIAGNOSIS — R6521 Severe sepsis with septic shock: Secondary | ICD-10-CM

## 2018-07-06 DIAGNOSIS — E872 Acidosis: Secondary | ICD-10-CM

## 2018-07-06 LAB — POCT I-STAT 7, (LYTES, BLD GAS, ICA,H+H)
ACID-BASE DEFICIT: 7 mmol/L — AB (ref 0.0–2.0)
Acid-base deficit: 7 mmol/L — ABNORMAL HIGH (ref 0.0–2.0)
Bicarbonate: 21 mmol/L (ref 20.0–28.0)
Bicarbonate: 21 mmol/L (ref 20.0–28.0)
Calcium, Ion: 1.26 mmol/L (ref 1.15–1.40)
Calcium, Ion: 1.24 mmol/L (ref 1.15–1.40)
HCT: 18 % — ABNORMAL LOW (ref 36.0–46.0)
HEMATOCRIT: 20 % — AB (ref 36.0–46.0)
HEMOGLOBIN: 6.8 g/dL — AB (ref 12.0–15.0)
Hemoglobin: 6.1 g/dL — CL (ref 12.0–15.0)
O2 Saturation: 93 %
O2 Saturation: 97 %
PCO2 ART: 63.3 mmHg — AB (ref 32.0–48.0)
PO2 ART: 90 mmHg (ref 83.0–108.0)
POTASSIUM: 4.7 mmol/L (ref 3.5–5.1)
Patient temperature: 99.7
Patient temperature: 100.6
Potassium: 4.5 mmol/L (ref 3.5–5.1)
Sodium: 145 mmol/L (ref 135–145)
Sodium: 145 mmol/L (ref 135–145)
TCO2: 23 mmol/L (ref 22–32)
TCO2: 23 mmol/L (ref 22–32)
pCO2 arterial: 60.1 mmHg — ABNORMAL HIGH (ref 32.0–48.0)
pH, Arterial: 7.133 — CL (ref 7.350–7.450)
pH, Arterial: 7.157 — CL (ref 7.350–7.450)
pO2, Arterial: 123 mmHg — ABNORMAL HIGH (ref 83.0–108.0)

## 2018-07-06 LAB — BASIC METABOLIC PANEL
Anion gap: 5 (ref 5–15)
Anion gap: 5 (ref 5–15)
BUN: 31 mg/dL — AB (ref 8–23)
BUN: 45 mg/dL — ABNORMAL HIGH (ref 8–23)
CO2: 20 mmol/L — ABNORMAL LOW (ref 22–32)
CO2: 22 mmol/L (ref 22–32)
Calcium: 8 mg/dL — ABNORMAL LOW (ref 8.9–10.3)
Calcium: 7.7 mg/dL — ABNORMAL LOW (ref 8.9–10.3)
Chloride: 117 mmol/L — ABNORMAL HIGH (ref 98–111)
Chloride: 114 mmol/L — ABNORMAL HIGH (ref 98–111)
Creatinine, Ser: 1.75 mg/dL — ABNORMAL HIGH (ref 0.44–1.00)
Creatinine, Ser: 2.45 mg/dL — ABNORMAL HIGH (ref 0.44–1.00)
GFR calc Af Amer: 33 mL/min — ABNORMAL LOW (ref >60)
GFR calc Af Amer: 22 mL/min — ABNORMAL LOW (ref >60)
GFR calc non Af Amer: 29 mL/min — ABNORMAL LOW (ref >60)
GFR calc non Af Amer: 19 mL/min — ABNORMAL LOW (ref >60)
GLUCOSE: 243 mg/dL — AB (ref 70–99)
Glucose, Bld: 162 mg/dL — ABNORMAL HIGH (ref 70–99)
Potassium: 4.4 mmol/L (ref 3.5–5.1)
Potassium: 4.8 mmol/L (ref 3.5–5.1)
SODIUM: 142 mmol/L (ref 135–145)
Sodium: 141 mmol/L (ref 135–145)

## 2018-07-06 LAB — CBC WITH DIFFERENTIAL/PLATELET
Abs Immature Granulocytes: 0.19 10*3/uL — ABNORMAL HIGH (ref 0.00–0.07)
Basophils Absolute: 0 10*3/uL (ref 0.0–0.1)
Basophils Relative: 0 %
Eosinophils Absolute: 0.7 10*3/uL — ABNORMAL HIGH (ref 0.0–0.5)
Eosinophils Relative: 7 %
HCT: 23.8 % — ABNORMAL LOW (ref 36.0–46.0)
Hemoglobin: 7.2 g/dL — ABNORMAL LOW (ref 12.0–15.0)
Immature Granulocytes: 2 %
Lymphocytes Relative: 7 %
Lymphs Abs: 0.7 10*3/uL (ref 0.7–4.0)
MCH: 30.9 pg (ref 26.0–34.0)
MCHC: 30.3 g/dL (ref 30.0–36.0)
MCV: 102.1 fL — ABNORMAL HIGH (ref 80.0–100.0)
Monocytes Absolute: 0.8 10*3/uL (ref 0.1–1.0)
Monocytes Relative: 8 %
Neutro Abs: 7.8 10*3/uL — ABNORMAL HIGH (ref 1.7–7.7)
Neutrophils Relative %: 76 %
Platelets: 95 10*3/uL — ABNORMAL LOW (ref 150–400)
RBC: 2.33 MIL/uL — AB (ref 3.87–5.11)
RDW: 16.6 % — ABNORMAL HIGH (ref 11.5–15.5)
WBC: 10.2 10*3/uL (ref 4.0–10.5)
nRBC: 0.6 % — ABNORMAL HIGH (ref 0.0–0.2)

## 2018-07-06 LAB — GLUCOSE, CAPILLARY
GLUCOSE-CAPILLARY: 136 mg/dL — AB (ref 70–99)
Glucose-Capillary: 134 mg/dL — ABNORMAL HIGH (ref 70–99)
Glucose-Capillary: 138 mg/dL — ABNORMAL HIGH (ref 70–99)
Glucose-Capillary: 159 mg/dL — ABNORMAL HIGH (ref 70–99)
Glucose-Capillary: 196 mg/dL — ABNORMAL HIGH (ref 70–99)
Glucose-Capillary: 95 mg/dL (ref 70–99)

## 2018-07-06 LAB — CYCLIC CITRUL PEPTIDE ANTIBODY, IGG/IGA: CCP Antibodies IgG/IgA: 7 units (ref 0–19)

## 2018-07-06 LAB — COMPREHENSIVE METABOLIC PANEL
ALK PHOS: 122 U/L (ref 38–126)
ALT: 10 U/L (ref 0–44)
AST: 17 U/L (ref 15–41)
Albumin: 1.4 g/dL — ABNORMAL LOW (ref 3.5–5.0)
Anion gap: 7 (ref 5–15)
BILIRUBIN TOTAL: 0.7 mg/dL (ref 0.3–1.2)
BUN: 33 mg/dL — ABNORMAL HIGH (ref 8–23)
CO2: 21 mmol/L — ABNORMAL LOW (ref 22–32)
Calcium: 7.9 mg/dL — ABNORMAL LOW (ref 8.9–10.3)
Chloride: 115 mmol/L — ABNORMAL HIGH (ref 98–111)
Creatinine, Ser: 1.83 mg/dL — ABNORMAL HIGH (ref 0.44–1.00)
GFR calc Af Amer: 31 mL/min — ABNORMAL LOW (ref >60)
GFR calc non Af Amer: 27 mL/min — ABNORMAL LOW (ref >60)
Glucose, Bld: 178 mg/dL — ABNORMAL HIGH (ref 70–99)
Potassium: 4.4 mmol/L (ref 3.5–5.1)
Sodium: 143 mmol/L (ref 135–145)
Total Protein: 5 g/dL — ABNORMAL LOW (ref 6.5–8.1)

## 2018-07-06 LAB — HEMOGLOBIN AND HEMATOCRIT, BLOOD
HCT: 29.3 % — ABNORMAL LOW (ref 36.0–46.0)
Hemoglobin: 8.9 g/dL — ABNORMAL LOW (ref 12.0–15.0)

## 2018-07-06 LAB — CULTURE, BLOOD (ROUTINE X 2)
CULTURE: NO GROWTH
Culture: NO GROWTH

## 2018-07-06 LAB — PREPARE RBC (CROSSMATCH)

## 2018-07-06 LAB — MAGNESIUM
Magnesium: 2.3 mg/dL (ref 1.7–2.4)
Magnesium: 2.4 mg/dL (ref 1.7–2.4)

## 2018-07-06 LAB — ABO/RH: ABO/RH(D): A POS

## 2018-07-06 LAB — ANTINUCLEAR ANTIBODIES, IFA: ANA Ab, IFA: NEGATIVE

## 2018-07-06 LAB — PHOSPHORUS: PHOSPHORUS: 3.7 mg/dL (ref 2.5–4.6)

## 2018-07-06 LAB — GLOMERULAR BASEMENT MEMBRANE ANTIBODIES: GBM Ab: 2 units (ref 0–20)

## 2018-07-06 LAB — SJOGRENS SYNDROME-A EXTRACTABLE NUCLEAR ANTIBODY: SSA (Ro) (ENA) Antibody, IgG: <0.2 AI (ref 0.0–0.9)

## 2018-07-06 LAB — SJOGRENS SYNDROME-B EXTRACTABLE NUCLEAR ANTIBODY: SSB (La) (ENA) Antibody, IgG: <0.2 AI (ref 0.0–0.9)

## 2018-07-06 LAB — ANTI-DNA ANTIBODY, DOUBLE-STRANDED: ds DNA Ab: <1 IU/mL (ref 0–9)

## 2018-07-06 MED ORDER — METHYLPREDNISOLONE SODIUM SUCC 125 MG IJ SOLR
125.0000 mg | Freq: Four times a day (QID) | INTRAMUSCULAR | Status: DC
  Administered 2018-07-06 – 2018-07-09 (×13): 125 mg via INTRAVENOUS
  Filled 2018-07-06 (×12): qty 2

## 2018-07-06 MED ORDER — STERILE WATER FOR INJECTION IV SOLN
INTRAVENOUS | Status: DC
  Filled 2018-07-06: qty 850

## 2018-07-06 MED ORDER — SODIUM BICARBONATE 8.4 % IV SOLN
INTRAVENOUS | Status: DC
  Administered 2018-07-06 – 2018-07-07 (×2): via INTRAVENOUS
  Filled 2018-07-06 (×2): qty 150

## 2018-07-06 MED ORDER — SODIUM CHLORIDE 0.9% IV SOLUTION: Freq: Once | INTRAVENOUS | Status: DC

## 2018-07-06 NOTE — Progress Notes (Signed)
ABG critical values reported:   PH: 7.13 CO2: 63 PO2: 90 Bicarb: 21

## 2018-07-06 NOTE — Progress Notes (Signed)
eLink Physician-Brief Progress Note Patient Name: Michele [name] DOB: 02/27/1946 MRN: [id number]   Date of Service  07/06/2018  HPI/Events of Note  HR transiently increased to 141. 12 Lead EKG reveals sinus tachycardia. Now in NSR with rate = 84.   eICU Interventions  Will order: 1. BMP and Mg++ level STAT.      Intervention Category Major Interventions: Arrhythmia - evaluation and management  Nicolemarie Wooley [name] 07/06/2018, 9:32 PM

## 2018-07-06 NOTE — Progress Notes (Signed)
Pharmacy Antibiotic Note  Michele [name] is a 73 y.o. female admitted on 07/04/2018 with pneumonia. He continues on cefepime. Currently AF, WBC down WNL. Scr has been rising, now 1.83 and estimated CrCl ~30 mL/min. All cultures have been negative. Today is D#5 of cefepime.  Plan: Continue cefepime 2g IV q24h F/u clinical status, renal function, de-escalation, LOT  Height: 5' 4.02" (162.6 cm) Weight: 195 lb 15.8 oz (88.9 kg) IBW/kg (Calculated) : 54.74  Temp (24hrs), Avg:97.4 F (36.3 C), Min:95.6 F (35.3 C), Max:99.7 F (37.6 C)  Recent Labs  Lab 07/12/2018 1630 07/05/2018 1850 07/17/2018 2255  07/03/18 0556 07/04/18 0331 07/04/18 2325 07/05/18 0404 07/06/18 0021 07/06/18 0415  WBC 9.8  --   --    < > 9.7 9.3 14.4* 14.4*  --  10.2  CREATININE 1.34*  --   --    < > 0.96 1.12* 1.47* 1.60* 1.75* 1.83*  LATICACIDVEN 2.2* 1.2 0.9  --   --   --  1.3  --   --   --    < > = values in this interval not displayed.    Estimated Creatinine Clearance: 30 mL/min (A) (by C-G formula based on SCr of 1.83 mg/dL (H)).    No Known Allergies  Antibiotics this admission: Levofloxacin PTA 2/7 >> 2/10 Cefepime 2/12 >> Vanc 2/12 >> 2/15  Microbiology: 2/12 RVP: neg 2/13 BCx: NG x4 2/13 MRSA PCR: neg  Thank you for allowing pharmacy to be a part of this patient's care.  Mila Merry Gerarda Fraction, PharmD, [facility] PGY2 Infectious Diseases Pharmacy Resident Phone: [phone] 07/06/2018   9:52 AM

## 2018-07-06 NOTE — Progress Notes (Signed)
73 year old ex-smoker who completed chemotherapy with cisplatin and etoposide for small cell lung cancer diagnosed 12/2017 but then developed progressive bilateral infiltrates since 05/2018, failed outpatient antibiotics and admitted 2/12 for ARDS  Last CT chest from 06/2018 also showed single-vessel PE and is on treatment dose Lovenox  She remains critically ill, sedated and paralyzed, on 100%/PEEP of 10, was 6.5 cc/kg, rate of 35, soft and nontender abdomen, decreased breath sounds bilateral, warm extremities  Labs reviewed-significant for ESR of 135, negative BAL cultures , worsening creatinine to 1.8 and drop in hemoglobin.  ABG shows persistent respiratory acidosis, acute Chest x-ray personally reviewed 2/17 which shows bilateral unchanged pulmonary infiltrates.  Impression/plan ARDS-we will increase tidal volume to 7 cc/kg, about 380 cc and repeat ABG >> again shows persistent severe acidosis, will add bicarbonate drip and continue lung protective ventilation Hold off diuresis for now given rising creatinine and shock -No obvious infectious cause, continue empiric antibiotics but will add IV Solu-Medrol 125 every 6 for 3 days -Continue paralytic and consider proning if no improvement in 24 hours  Septic shock-continue Levophed and taper off Giapreza  First  Anemia, chronic versus acute blood loss-hold Lovenox given rising creatinine, transfuse 1 unit PRBC, if no obvious source of bleeding in 24 hours, can restart IV heparin  AKI -about 15 L positive eventually needs aggressive diuresis once renal function stabilizes and off pressors  Guarded prognosis given to husband at bedside due to severe ARDS  The patient is critically ill with multiple organ systems failure and requires high complexity decision making for assessment and support, frequent evaluation and titration of therapies, application of advanced monitoring technologies and extensive interpretation of multiple databases. Critical  Care Time devoted to patient care services described in this note independent of APP/resident  time is 45 minutes.   Leanna Sato Elsworth Soho MD

## 2018-07-06 NOTE — Progress Notes (Signed)
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  Patient is unable to communicate on BiPAP via nothing but nods so the history is obtained via husband.  CTA 2/12 0- The CT is positive for a single pulmonary embolus involving subsegmental vessel right lower lobe.and diffuse ALI  Past Medical History  Small cell   Significant Hospital Events    2/13 - echo - diast dysfn, EF 65% 2/14 respiratory failure 2/15 -  1005 fio2/peep 5 . Fever 101F WBC 10. Dysnch with vent. On fent gtt. Not on pressors. ECHO 07/02/2018 - ef 65%. Has new AKI creat 1.12. PCT 0.5 and borderline Husband at bedside - says aug/sept 2019 Icard made dx of small cell. Rx at [facility] with platin/taxol Rx and 33d of XRT to lung. Dec 2019 CT chest clear but for ? Mild fibrosis at lung based, Jan 2020 CT - new ggo scatterd. Feb 2020 now worse GGO with some fibrotic hchanges and worse  2/16 aRDS protocol with d1 completing of nimbex. Currently on 60% fio2/peep 10 -> pulse ox 96%, levophed 37, vaso 0.03, fent gtt, versed gtt, nimbex gtt. Urine strep - negative. RVP neg. Pressor needs was after nimbex. Hsban changed to full code but told RN - no LTAC/no PEG/no TRach. IV team refused picc line. Ur op dropped ESR 131  Consults:  PCCM  Procedures:  Portocath long standing ETT 2/14>>> R radial aline 2/15 >> Left PICC 2/16 >>  Significant Diagnostic Tests:  2/12 CTA chest PE >> Multifocal pneumonia of all lobes, which has significantly progressed from the CT of 06/08/2018. Bilateral small pleural effusions. Infection accounts for the mediastinal  adenopathy, which is most likely reactive.  The CT is positive for a single pulmonary embolus involving subsegmental vessel right lower lobe.  Soft tissue in the left hilar region is compatible with the patient's known history of lung carcinoma.  Aortic atherosclerosis with left main and 2 vessel coronary artery disease.  Micro Data:  Blood 2/14>>>  ngtd Urine 2/14>>> not sent BAL 2/14>>>         Gram stain >>          Pneumocystis >> neg RVP  2/12 - neg Urine srep negative MRSA PCR - negative  Antimicrobials:  Levofloxacin 2/8 >>2/10 Cefepime 2/14>>> Vancomycin 2/14>>>   Interim history/subjective:  Overnight, hypothermic requiring bairhugger Worsening CXR and O2 needs (now on 1.0 FiO2) with easy desaturation with movement Remains on fentanyl 100 mcg/min / versed 95m/hr (decreased due to low BIS in 30's) Remains on nimbex, giapreza, and levophed now at 11 mcg/min. Worsening sCr/ oliguria   Objective   Blood pressure (!) 118/48, pulse (!) 109, temperature 99.7 F (37.6 C), temperature source Rectal, resp. rate (!) 35, height 5' 4.02" (1.626 m), weight 88.9 kg, SpO2 91 %.    Vent Mode: PRVC FiO2 (%):  [60 %-100 %] 100 % Set Rate:  [35 bmp] 35 bmp Vt Set:  [350 mL] 350 mL PEEP:  [10 cmH20] 10 cmH20 Plateau Pressure:  [30 cmH20-33 cmH20] 33 cmH20   Intake/Output Summary (Last 24 hours) at 07/06/2018 1030  Last data filed at 07/06/2018 1000 Gross per 24 hour  Intake 2873.07 ml  Output 339 ml  Net 2534.07 ml   Filed Weights   07/04/18 0457 07/05/18 0500 07/06/18 0417  Weight: 81.3 kg 85.4 kg 88.9 kg   General:  Critically ill female paralyzed and sedated on MV HEENT: MM pink/moist, pupils 2/reactive, ETT at 22/23, OGT Neuro:  Sedated and paralyzed, BIS 30's, TOF 2/4 CV: RR, no murmur, port in right chest accessed PULM: synchronous/ paralyzed on MV, clear, diminished in left base GI: soft, hypo BS  Extremities: warm/dry, generalized edema  Skin: no rashes   LABS     PULMONARY Recent Labs  Lab 07/03/18 1413 07/03/18 1522 07/04/18 1620 07/04/18 2116 07/05/18 0406  PHART 7.261* 7.319* 7.219* 7.271* 7.250*  PCO2ART 52.0* 44.3 54.9* 51.3* 54.1*  PO2ART 88.0 133.0* 102.0 249.0* 290.0*  HCO3 23.4 22.8 22.3 23.6 23.7  TCO2 _0 O2SAT 95.0 99.0 96.0 100.0 100.0    CBC Recent Labs  Lab 07/04/18 2325 07/05/18 0404 07/05/18 0406 07/06/18 0415  HGB 7.3* 7.2* 7.8* 7.2*  HCT 24.3* 24.3* 23.0* 23.8*  WBC 14.4* 14.4*  --  10.2  PLT 130* 129*  --  95*    COAGULATION Recent Labs  Lab 06/21/2018 1630 07/05/18 0404  INR 3.16 1.47    CARDIAC   Recent Labs  Lab 07/05/18 0404  TROPONINI 0.06*   No results for input(s): PROBNP in the last 168 hours.   CHEMISTRY Recent Labs  Lab 07/03/18 0429  07/03/18 1801 07/04/18 0331  07/04/18 2325 07/05/18 0404 07/05/18 0406 07/06/18 0021 07/06/18 0415  NA 145   < >  --  146*   < > 138 146* 145 142 143  K 2.9*   < >  --  3.4*   < > 3.2* 4.0 4.0 4.4 4.4  CL 113*   < >  --  117*  --  110 118*  --  117* 115*  CO2 22   < >  --  23  --  22 17*  --  20* 21*  GLUCOSE 88   < >  --  124*  --  204* 188*  --  162* 178*  BUN 9   < >  --  17  --  23 26*  --  31* 33*  CREATININE 0.94   < >  --  1.12*  --  1.47* 1.60*  --  1.75* 1.83*  CALCIUM 7.7*   < >  --  7.7*  --  7.5* 8.1*  --  8.0* 7.9*  MG 1.8  --  1.8 1.6*  --   --  2.5*  --   --  2.3  PHOS  --   --  3.8 2.9  --   --  3.4  --   --  3.7   < > = values in this interval not displayed.   Estimated Creatinine Clearance: 30 mL/min (A) (by C-G formula based on SCr of 1.83 mg/dL (H)).   LIVER Recent Labs  Lab 06/24/2018 1630 07/02/18 0228 07/03/18 0429 07/05/18 0404 07/06/18 0415  AST _1 ALT _2 ALKPHOS 82 93 131* 129*  138* 122  BILITOT 1.3* 1.5* 1.8* 0.9  0.9 0.7  PROT 6.2* 5.7* 5.2* 5.3*  5.2* 5.0*  ALBUMIN 2.5* 2.3* 1.9* 1.5*  1.5* 1.4*  INR 3.16  --   --  1.47  --       INFECTIOUS Recent Labs  Lab 06/21/2018 1850 07/08/2018 2255 07/02/18 0228 07/03/18 0429 07/04/18 2325  LATICACIDVEN 1.2 0.9  --   --  1.3  PROCALCITON  --   --  0.39 0.53  --      ENDOCRINE CBG (last 3)  Recent Labs    07/06/18 0003 07/06/18 0400 07/06/18 0815  GLUCAP 138* 159* 136*    Dg Chest Port 1 View  Result Date: 07/06/2018 CLINICAL DATA:  Endotracheal tube. EXAM: PORTABLE CHEST 1 VIEW COMPARISON:  Yesterday FINDINGS: The endotracheal tube has migrated into the right mainstem. Recommend retraction by 2-3 cm. The orogastric tube at least reaches the stomach. Bilateral central line with tips in good position. Lower volumes with increased generalized opacity. There is history of sepsis and pneumonia. No pneumothorax. These results will be called to the ordering clinician or representative by the Radiologist Assistant, and communication documented in the PACS or zVision Dashboard. IMPRESSION: 1. Endotracheal tube has migrated into the right mainstem. Recommend tube retraction by 2-3 cm. 2. History of pneumonia with interval worsening aeration. Electronically Signed   By: Monte Fantasia M.D.   On: 07/06/2018 05:19   Dg Chest Port 1 View  Result Date: 07/05/2018 CLINICAL DATA:  Post endotracheal tube placement EXAM: PORTABLE CHEST 1 VIEW COMPARISON:  07/04/2018; 07/03/2018; 07/17/2018; chest CT-07/10/2018 FINDINGS: Grossly unchanged cardiac silhouette and mediastinal contours. Slight advancement of endotracheal tube with tip now approximately 1.6 cm above the carina. Otherwise, stable position of support apparatus. Grossly unchanged diffuse interstitial thickening with relative areas of masslike consolidation about the left hilum and right lower lobe. Unchanged suspected trace bilateral effusions. Unchanged biapical pleuroparenchymal thickening. No pneumothorax. No definite evidence of edema. No acute osseous abnormalities. IMPRESSION: 1. Slight advancement of endotracheal tube  with tip now approximately 1.6 cm above the carina. Otherwise, stable positioning of support apparatus. No pneumothorax. 2. Grossly unchanged extensive bilateral heterogeneous and consolidative opacities, nonspecific though again worrisome for micro focal infection, including atypical etiologies. Electronically Signed   By: Sandi Mariscal M.D.   On: 07/05/2018 08:30   Dg Chest Port 1 View  Result Date: 07/04/2018 CLINICAL DATA:  ARDS. EXAM: PORTABLE CHEST 1 VIEW COMPARISON:  Radiographs 07/03/2018 and 07/14/2018.  CT 06/21/2018. FINDINGS: 1305 hours. Endotracheal tube has been partially withdrawn, now lying 6.7 cm above the carina, but below the thoracic inlet. Enteric tube projects below the diaphragm, tip not visualized. Right subclavian Port-A-Cath is unchanged at the lower cavoatrial junction level. The heart size and mediastinal contours are stable. Diffuse bilateral airspace opacities are again noted with slight worsening in the left perihilar region. There is no pneumothorax or significant pleural effusion. IMPRESSION: Slight worsening of the left perihilar component of the bilateral airspace opacities consistent with ARDS/pneumonia. No pneumothorax. Satisfactory position of the support system. Electronically Signed   By: Richardean Sale M.D.   On: 07/04/2018 15:35   Dg Chest Port 1v Same Day  Result Date: 07/04/2018 CLINICAL DATA:  Intubation EXAM: PORTABLE CHEST 1 VIEW COMPARISON:  Portable exam 1632 hours compared to 07/04/2018 at 1305 hours FINDINGS: Tip of endotracheal tube projects 3.5 cm above carina. Nasogastric tube extends into stomach. RIGHT subclavian Port-A-Cath with tip projecting over SVC. Normal heart size mediastinal contours. Atherosclerotic calcifications aorta. Diffuse BILATERAL pulmonary infiltrates which could represent pneumonia or ARDS. Minimal RIGHT pleural effusion. No pneumothorax. Bones demineralized. IMPRESSION: Persistent diffuse BILATERAL pulmonary infiltrates question  pneumonia versus ARDS. Electronically Signed   By: Elta Guadeloupe  Thornton Papas M.D.   On: 07/04/2018 17:16   Dg Abd Portable 1v  Result Date: 07/04/2018 CLINICAL DATA:  Orogastric tube placement EXAM: PORTABLE ABDOMEN - 1 VIEW COMPARISON:  Portable exam 1640 hours compared to CT abdomen and pelvis of 06/27/2018 FINDINGS: Tip of orogastric tube projects over distal gastric or pylorus. Paucity of bowel gas in upper abdomen. Severe diffuse BILATERAL pulmonary infiltrates. Osseous structures unremarkable. IMPRESSION: Tip of nasogastric tube projects over distal gastric antrum or pylorus. Electronically Signed   By: Lavonia Dana M.D.   On: 07/04/2018 17:17   [place] Ekg Site Rite  Result Date: 07/04/2018 If Site Rite image not attached, placement could not be confirmed due to current cardiac rhythm.  Resolved Hospital Problem list   N/A  Assessment & Plan:  ASSESSMENT / PLAN:  PULMONARY A:  Acute severe hypoxemic resp failure ARDS physiology - concerning for viral hit and run from Jan 2020 or Chemo/XRt lung injury. Less likely is HCAP. On 2/12 - also single vessel PE RLL - has been on modified ARDS (on 10 peep/ >70% FiO2) since 2/15 on nimbex.   - ESR 135 P:   AM CXR with ETT migration into right mainstem, since retracted 3 cm, with worsening infiltrates  ABG now and daily - ABG noted with worsening resp acidosis/ oxygenation Increase TV to 7 cc, continue rate 35 PEEP/ FiO2 per ARDS protocol  Repeat ABG in hour, if PH still not > 7.2, will need to start bicarb gtt Continue nimbex (started 2/15) Cotinue lovenox  pending culture data- continue empiric vanc/ cefepime Send urine legionella After discussion with Dr. Elsworth Soho, will continue Nimbex and start high dose steroids, solumedrol 142m q 6 When pressor requirement resolved, consider diuresis as she is net + 16.8 L Autoimmune workup pending-  - ANA, CCP  Pending  ; GMB, SSa neg - RF 14.9  CARDIOVASCULAR A:   07/05/2018 - In Circulatory shock  - on high dose  levophed and shock vasopressin  P:  MAP goal > 65 Weaning levophed / giapreza     RENAL A:  AKI Mild hyperchloremic acidosis - worsening sCr 1.6- > 1.75 -> 1.83 and oliguria 0.1 ml/ kg/ hr P:  Strict I/O, UOP Trend renal panel   ELECTROLYTES A:  Normal 2/16/20202 P:  monitor   GASTROINTESTINAL A:   NPO P:   Continue TF   HEMATOLOGIC A:  Anemia - stable Mild thrombocytopenia   - on Rx dose lovenox for single vessel PE 06/26/2018 P:  trend CBC Transfuse for Hgb <7 Continue Lovenox Rx dose   INFECTIOUS A:   Being Rx for HCAP - PCT is borderline P:   abx as above  ENDOCRINE A:   At risk hyperglycemia   P:   SSI sensitive, may need to increase given initiation of steroids CBG q 4  NEUROLOGIC A:   Need for deep sedation w/ ARDS  P:   on nimbex, RASS goal -5 / TOF/ BIS monitoring with fentanyl/ versed  GLOBAL Nutn: TF DVT proph: Lovenox  GI proph: pepcid Family: husband updated Goals: Full code but no LTAC, trach, peg Disp: keep in ICU  CCT 60 mins   BKennieth Rad MSN, AGACNP-BC [facility] Pgr: [phone] if no answer [phone] [id number]/17/2020, 10:30 AM

## 2018-07-07 ENCOUNTER — Inpatient Hospital Stay (HOSPITAL_COMMUNITY): Payer: Medicare Other

## 2018-07-07 ENCOUNTER — Telehealth: Payer: Self-pay

## 2018-07-07 LAB — POCT I-STAT 7, (LYTES, BLD GAS, ICA,H+H)
Acid-base deficit: 6 mmol/L — ABNORMAL HIGH (ref 0.0–2.0)
Acid-base deficit: 3 mmol/L — ABNORMAL HIGH (ref 0.0–2.0)
BICARBONATE: 21.5 mmol/L (ref 20.0–28.0)
Bicarbonate: 23.9 mmol/L (ref 20.0–28.0)
CALCIUM ION: 1.21 mmol/L (ref 1.15–1.40)
Calcium, Ion: 1.1 mmol/L — ABNORMAL LOW (ref 1.15–1.40)
HCT: 24 % — ABNORMAL LOW (ref 36.0–46.0)
HCT: 20 % — ABNORMAL LOW (ref 36.0–46.0)
Hemoglobin: 8.2 g/dL — ABNORMAL LOW (ref 12.0–15.0)
Hemoglobin: 6.8 g/dL — CL (ref 12.0–15.0)
O2 SAT: 96 %
O2 Saturation: 91 %
Patient temperature: 97.7
Potassium: 4.6 mmol/L (ref 3.5–5.1)
Potassium: 4 mmol/L (ref 3.5–5.1)
Sodium: 144 mmol/L (ref 135–145)
Sodium: 145 mmol/L (ref 135–145)
TCO2: 23 mmol/L (ref 22–32)
TCO2: 25 mmol/L (ref 22–32)
pCO2 arterial: 53.9 mmHg — ABNORMAL HIGH (ref 32.0–48.0)
pCO2 arterial: 50.6 mmHg — ABNORMAL HIGH (ref 32.0–48.0)
pH, Arterial: 7.208 — ABNORMAL LOW (ref 7.350–7.450)
pH, Arterial: 7.28 — ABNORMAL LOW (ref 7.350–7.450)
pO2, Arterial: 96 mmHg (ref 83.0–108.0)
pO2, Arterial: 68 mmHg — ABNORMAL LOW (ref 83.0–108.0)

## 2018-07-07 LAB — GLUCOSE, CAPILLARY
GLUCOSE-CAPILLARY: 260 mg/dL — AB (ref 70–99)
Glucose-Capillary: 177 mg/dL — ABNORMAL HIGH (ref 70–99)
Glucose-Capillary: 210 mg/dL — ABNORMAL HIGH (ref 70–99)
Glucose-Capillary: 254 mg/dL — ABNORMAL HIGH (ref 70–99)
Glucose-Capillary: 261 mg/dL — ABNORMAL HIGH (ref 70–99)
Glucose-Capillary: 272 mg/dL — ABNORMAL HIGH (ref 70–99)
Glucose-Capillary: 280 mg/dL — ABNORMAL HIGH (ref 70–99)
Glucose-Capillary: 316 mg/dL — ABNORMAL HIGH (ref 70–99)
Glucose-Capillary: 403 mg/dL — ABNORMAL HIGH (ref 70–99)

## 2018-07-07 LAB — CBC WITH DIFFERENTIAL/PLATELET
Abs Immature Granulocytes: 0.56 10*3/uL — ABNORMAL HIGH (ref 0.00–0.07)
BASOS ABS: 0.1 10*3/uL (ref 0.0–0.1)
Basophils Relative: 1 %
Eosinophils Absolute: 0.1 10*3/uL (ref 0.0–0.5)
Eosinophils Relative: 1 %
HCT: 27.7 % — ABNORMAL LOW (ref 36.0–46.0)
Hemoglobin: 8.4 g/dL — ABNORMAL LOW (ref 12.0–15.0)
Immature Granulocytes: 6 %
LYMPHS ABS: 0.6 10*3/uL — AB (ref 0.7–4.0)
Lymphocytes Relative: 6 %
MCH: 30.2 pg (ref 26.0–34.0)
MCHC: 30.3 g/dL (ref 30.0–36.0)
MCV: 99.6 fL (ref 80.0–100.0)
Monocytes Absolute: 0.4 10*3/uL (ref 0.1–1.0)
Monocytes Relative: 3 %
Neutro Abs: 8.6 10*3/uL — ABNORMAL HIGH (ref 1.7–7.7)
Neutrophils Relative %: 83 %
PLATELETS: 95 10*3/uL — AB (ref 150–400)
RBC: 2.78 MIL/uL — AB (ref 3.87–5.11)
RDW: 17.5 % — AB (ref 11.5–15.5)
WBC: 10.2 10*3/uL (ref 4.0–10.5)
nRBC: 2.2 % — ABNORMAL HIGH (ref 0.0–0.2)

## 2018-07-07 LAB — RENAL FUNCTION PANEL
Albumin: 1.4 g/dL — ABNORMAL LOW (ref 3.5–5.0)
Anion gap: 9 (ref 5–15)
BUN: 54 mg/dL — ABNORMAL HIGH (ref 8–23)
CHLORIDE: 112 mmol/L — AB (ref 98–111)
CO2: 24 mmol/L (ref 22–32)
Calcium: 7.6 mg/dL — ABNORMAL LOW (ref 8.9–10.3)
Creatinine, Ser: 2.71 mg/dL — ABNORMAL HIGH (ref 0.44–1.00)
GFR calc Af Amer: 20 mL/min — ABNORMAL LOW (ref >60)
GFR calc non Af Amer: 17 mL/min — ABNORMAL LOW (ref >60)
Glucose, Bld: 303 mg/dL — ABNORMAL HIGH (ref 70–99)
Phosphorus: 3.9 mg/dL (ref 2.5–4.6)
Potassium: 4.4 mmol/L (ref 3.5–5.1)
Sodium: 145 mmol/L (ref 135–145)

## 2018-07-07 LAB — BASIC METABOLIC PANEL
Anion gap: 14 (ref 5–15)
BUN: 66 mg/dL — ABNORMAL HIGH (ref 8–23)
CHLORIDE: 109 mmol/L (ref 98–111)
CO2: 22 mmol/L (ref 22–32)
Calcium: 7.5 mg/dL — ABNORMAL LOW (ref 8.9–10.3)
Creatinine, Ser: 2.94 mg/dL — ABNORMAL HIGH (ref 0.44–1.00)
GFR calc Af Amer: 18 mL/min — ABNORMAL LOW (ref >60)
GFR calc non Af Amer: 15 mL/min — ABNORMAL LOW (ref >60)
Glucose, Bld: 250 mg/dL — ABNORMAL HIGH (ref 70–99)
Potassium: 4.2 mmol/L (ref 3.5–5.1)
SODIUM: 145 mmol/L (ref 135–145)

## 2018-07-07 LAB — BPAM RBC
Blood Product Expiration Date: 202003092359
ISSUE DATE / TIME: 202002171652
Unit Type and Rh: 6200

## 2018-07-07 LAB — TYPE AND SCREEN
ABO/RH(D): A POS
Antibody Screen: NEGATIVE
Unit division: 0

## 2018-07-07 LAB — MAGNESIUM
MAGNESIUM: 2.6 mg/dL — AB (ref 1.7–2.4)
Magnesium: 2.4 mg/dL (ref 1.7–2.4)

## 2018-07-07 LAB — LEGIONELLA PNEUMOPHILA SEROGP 1 UR AG: L. PNEUMOPHILA SEROGP 1 UR AG: NEGATIVE

## 2018-07-07 LAB — PNEUMOCYSTIS JIROVECI SMEAR BY DFA: PNEUMOCYSTIS JIROVECI AG: NEGATIVE

## 2018-07-07 LAB — HEMOGLOBIN AND HEMATOCRIT, BLOOD
HCT: 23.5 % — ABNORMAL LOW (ref 36.0–46.0)
Hemoglobin: 6.9 g/dL — CL (ref 12.0–15.0)

## 2018-07-07 LAB — HEPARIN LEVEL (UNFRACTIONATED): HEPARIN UNFRACTIONATED: 1.5 [IU]/mL — AB (ref 0.30–0.70)

## 2018-07-07 LAB — PHOSPHORUS: Phosphorus: 3.9 mg/dL (ref 2.5–4.6)

## 2018-07-07 MED ORDER — FENTANYL BOLUS VIA INFUSION
50.0000 ug | INTRAVENOUS | Status: DC | PRN
  Administered 2018-07-07 – 2018-07-15 (×6): 50 ug via INTRAVENOUS
  Filled 2018-07-07: qty 50

## 2018-07-07 MED ORDER — SODIUM BICARBONATE 8.4 % IV SOLN
50.0000 meq | Freq: Once | INTRAVENOUS | Status: AC
  Administered 2018-07-07: 50 meq via INTRAVENOUS
  Filled 2018-07-07: qty 50

## 2018-07-07 MED ORDER — FUROSEMIDE 10 MG/ML IJ SOLN
100.0000 mg | Freq: Four times a day (QID) | INTRAVENOUS | Status: AC
  Administered 2018-07-07 (×2): 100 mg via INTRAVENOUS
  Filled 2018-07-07 (×2): qty 10

## 2018-07-07 MED ORDER — MIDAZOLAM 50MG/50ML (1MG/ML) PREMIX INFUSION
0.0000 mg/h | INTRAVENOUS | Status: DC
  Administered 2018-07-07: 2 mg/h via INTRAVENOUS
  Administered 2018-07-08 (×2): 3 mg/h via INTRAVENOUS
  Administered 2018-07-09: 4 mg/h via INTRAVENOUS
  Administered 2018-07-09: 3 mg/h via INTRAVENOUS
  Administered 2018-07-10 – 2018-07-11 (×2): 1 mg/h via INTRAVENOUS
  Administered 2018-07-12: 5 mg/h via INTRAVENOUS
  Filled 2018-07-07 (×7): qty 50

## 2018-07-07 MED ORDER — FENTANYL CITRATE (PF) 100 MCG/2ML IJ SOLN: 50.0000 ug | Freq: Once | INTRAMUSCULAR | Status: DC

## 2018-07-07 MED ORDER — HEPARIN (PORCINE) 25000 UT/250ML-% IV SOLN
1100.0000 [IU]/h | INTRAVENOUS | Status: DC
  Administered 2018-07-07: 1100 [IU]/h via INTRAVENOUS

## 2018-07-07 MED ORDER — INSULIN ASPART 100 UNIT/ML ~~LOC~~ SOLN
0.0000 [IU] | SUBCUTANEOUS | Status: DC
  Administered 2018-07-07 (×2): 8 [IU] via SUBCUTANEOUS
  Administered 2018-07-07: 3 [IU] via SUBCUTANEOUS
  Administered 2018-07-07: 5 [IU] via SUBCUTANEOUS
  Administered 2018-07-08: 3 [IU] via SUBCUTANEOUS
  Administered 2018-07-08: 2 [IU] via SUBCUTANEOUS
  Administered 2018-07-08 (×2): 3 [IU] via SUBCUTANEOUS
  Administered 2018-07-08: 2 [IU] via SUBCUTANEOUS
  Administered 2018-07-08 – 2018-07-09 (×3): 3 [IU] via SUBCUTANEOUS
  Administered 2018-07-09: 2 [IU] via SUBCUTANEOUS
  Administered 2018-07-09 (×3): 3 [IU] via SUBCUTANEOUS
  Administered 2018-07-10 – 2018-07-12 (×13): 2 [IU] via SUBCUTANEOUS
  Administered 2018-07-12: 3 [IU] via SUBCUTANEOUS
  Administered 2018-07-13 – 2018-07-14 (×8): 2 [IU] via SUBCUTANEOUS
  Administered 2018-07-14: 3 [IU] via SUBCUTANEOUS
  Administered 2018-07-14 – 2018-07-15 (×4): 2 [IU] via SUBCUTANEOUS
  Administered 2018-07-15: 3 [IU] via SUBCUTANEOUS
  Administered 2018-07-15: 2 [IU] via SUBCUTANEOUS

## 2018-07-07 MED ORDER — FENTANYL 2500MCG IN NS 250ML (10MCG/ML) PREMIX INFUSION
25.0000 ug/h | INTRAVENOUS | Status: DC
  Administered 2018-07-07: 100 ug/h via INTRAVENOUS
  Administered 2018-07-08: 125 ug/h via INTRAVENOUS
  Administered 2018-07-09 (×2): 200 ug/h via INTRAVENOUS
  Administered 2018-07-10: 175 ug/h via INTRAVENOUS
  Administered 2018-07-11: 100 ug/h via INTRAVENOUS
  Administered 2018-07-12: 225 ug/h via INTRAVENOUS
  Administered 2018-07-12: 200 ug/h via INTRAVENOUS
  Administered 2018-07-13: 250 ug/h via INTRAVENOUS
  Administered 2018-07-13 (×2): 375 ug/h via INTRAVENOUS
  Administered 2018-07-13: 200 ug/h via INTRAVENOUS
  Administered 2018-07-14 (×3): 375 ug/h via INTRAVENOUS
  Administered 2018-07-15 – 2018-07-16 (×4): 400 ug/h via INTRAVENOUS
  Filled 2018-07-07 (×19): qty 250

## 2018-07-07 MED ORDER — MIDAZOLAM BOLUS VIA INFUSION
1.0000 mg | INTRAVENOUS | Status: DC | PRN
  Administered 2018-07-08 – 2018-07-10 (×3): 1 mg via INTRAVENOUS
  Filled 2018-07-07: qty 2

## 2018-07-07 MED ORDER — HEPARIN (PORCINE) 25000 UT/250ML-% IV SOLN
1300.0000 [IU]/h | INTRAVENOUS | Status: DC
  Administered 2018-07-07: 1300 [IU]/h via INTRAVENOUS
  Filled 2018-07-07: qty 250

## 2018-07-07 NOTE — Progress Notes (Signed)
PCCM interval note  Notified by RN of secretions pooling in mouth, with tan color suspicious for tube feeds. TF were stopped and residuals checked with current residual still > 400.  No appearance of TF on ETT suctioning. No change in FiO2/ PEEP requirements, remains on 60% and peep 10.  No BM known since admit but patient has been receiving scheduled bowel regimen.  Prior to this, RN noted change from regular SR to SR with frequent PAC's with increase in sedation - fentanyl 100 ->125 mcg, versed bolus then 2->3 needed for ventilator synchrony and jump in levophed requirement from 10 mcg to 20 mcg.  Patient is current synchronous with vent.  P:  Recheck electrolytes OGT connected to LWS, then to be clamped x 4 hours.  Recheck residuals then, if still > 400, consider add prokinetic agent.  If she is requiring higher vasopressor support, will need to add back Mariane Baumgarten, MSN, AGACNP-BC [facility] Pgr: [phone] or if no answer [phone] 07/07/2018, 6:50 PM

## 2018-07-07 NOTE — Progress Notes (Signed)
ANTICOAGULATION CONSULT NOTE  Pharmacy Consult for Heparin Indication: pulmonary embolus   No Known Allergies  Patient Measurements: TBW: 94.1 kg Heparin Dosing Weight: 77 kg  Vital Signs: Temp: 97.1 F (36.2 C) (02/18 1941) Temp Source: Axillary (02/18 1941) BP: 126/59 (02/18 2000) Pulse Rate: 79 (02/18 2014)  Labs: Recent Labs    07/05/18 0404  07/06/18 0415  07/06/18 2132  07/07/18 0413 07/07/18 0452 07/07/18 0532 07/07/18 1124 07/07/18 1958  HGB 7.2*   < > 7.2*   < >  --    < > 8.2*  --  8.4* 6.8*  --   HCT 24.3*   < > 23.8*   < >  --    < > 24.0*  --  27.7* 20.0*  --   PLT 129*  --  95*  --   --   --   --   --  95*  --   --   LABPROT 17.6*  --   --   --   --   --   --   --   --   --   --   INR 1.47  --   --   --   --   --   --   --   --   --   --   HEPARINUNFRC  --   --   --   --   --   --   --   --   --   --  1.50*  CREATININE 1.60*   < > 1.83*  --  2.45*  --   --  2.71*  --   --  2.94*  TROPONINI 0.06*  --   --   --   --   --   --   --   --   --   --    < > = values in this interval not displayed.   Estimated Creatinine Clearance: 19.3 mL/min (A) (by C-G formula based on SCr of 2.94 mg/dL (H)).  Assessment: 64 yoF on Xarelto PTA for h/o afib and stroke. Found to have acute PE on admission and transitioned to treatment dose enoxaparin. Decrease in hemoglobin to 6.1 yesterday PM and enoxaparin was discontinued. Pharmacy now consulted to transition to IV heparin. 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    Heparin level came back supratherapeutic at 1.5 tonight. Rn said that there is some blood around the mouth area. We will hold heparin for 1 hour and decrease the dose.  Goal of Therapy:  Heparin level 0.3-0.7 Monitor platelets by anticoagulation protocol: Yes   Plan:   Hold heparin x 1 hr Restart heparin at 1100 units/hr Heparin level in AM Monitor closely for s/sx of bleeding  Onnie Boer, PharmD, BCIDP, AAHIVP, CPP Infectious  Disease Pharmacist 07/07/2018 9:02 PM

## 2018-07-07 NOTE — Progress Notes (Signed)
Pts nimbex gtt turned off @1515 .  ~1715, pt became desynchronous w/ vent and rhythm change noted on tele. 12 lead EKG showing NSR w/ frequent blocked PACs.  Fentanyl and Versed increased resulting in need for increase in levo.  All relayed to CCM NP  Shortly later brown/tan secretions noted coming out of pts mouth.  TF stopped and aspirated residual was 520ml.  Relayed to CCM NP.  TF connected to full suction at this point and total residual: 116ml.  Received instructions from CCM NP to hold TF for 4 hours and restart @ trickle feed. During this incident there were no changes in RR, SpO2 or breath sounds. Suctioned ETT, small amount of clear secretions noted.

## 2018-07-07 NOTE — Progress Notes (Signed)
NAME:  Michele [name], MRN:  [id number], DOB:  Feb 17, 1946, LOS: 6 ADMISSION DATE:  07/08/2018, CONSULTATION DATE:  07/03/2018 REFERRING MD:  Thereasa Solo, CHIEF COMPLAINT:  Dyspnea   Brief History   73 y/o female with small cel lung cancer treated with etoposide and carboplatin completed 05/2018 admitted for acute hypoxemic respiratory failure with progressive infiltrates and a PE, required intubation.    Past Medical History  Small cell lung cancer  Significant Hospital Events   2/13 - echo - diast dysfn, EF 65% 2/14 respiratory failure 2/15 -  1005 fio2/peep 5 . Fever 101F WBC 10. Dysnch with vent. On fent gtt. Not on pressors. ECHO 07/02/2018 - ef 65%. Has new AKI creat 1.12. PCT 0.5 and borderline  Consults:  PCCM  Procedures:  Portocath long standing ETT 2/14>>> 2/16 PICC left arm>   Significant Diagnostic Tests:  2/12 CT angiogram chest > small subsegmental PE, diffuse bilateral infiltrates  Micro Data:  Blood 2/14>>> Urine 2/14>>> Sputum 2/14>>> BAL 2/14>>> RV  21/12 - neg Urine strep negative   Antimicrobials:  Cefepime 2/14>>> Vancomycin 2/14>>> 2/14  Interim history/subjective:  Solumedrol added yesterday Weaning FiO2 this morning Now with higher blood glucose readings Nearly off vasopressors  Objective   Blood pressure 129/60, pulse 76, temperature 97.7 F (36.5 C), temperature source Oral, resp. rate (!) 33, height 5' 4.02" (1.626 m), weight 94.1 kg, SpO2 96 %.    Vent Mode: PRVC FiO2 (%):  [60 %-100 %] 60 % Set Rate:  [35 bmp] 35 bmp Vt Set:  [350 mL-380 mL] 380 mL PEEP:  [10 cmH20-12 cmH20] 12 cmH20 Plateau Pressure:  [30 cmH20-36 cmH20] 34 cmH20   Intake/Output Summary (Last 24 hours) at 07/07/2018 0915 Last data filed at 07/07/2018 0800 Gross per 24 hour  Intake 3787.09 ml  Output 158 ml  Net 3629.09 ml   Filed Weights   07/05/18 0500 07/06/18 0417 07/07/18 0443  Weight: 85.4 kg 88.9 kg 94.1 kg    Examination:  General:  In bed on  vent HENT: NCAT ETT in place PULM: Crackles bilaterally B, vent supported breathing CV: RRR, no mgr GI: BS+, soft, nontender Derm: edema bilaterally arms and legs, some bruising  MSK: normal bulk and tone Neuro: sedated on vent    Resolved Hospital Problem list     Assessment & Plan:  Acute respiratory failure with hypoxemia/ARDS Diffuse bilateral infiltrates: drug reaction? Recurrent malignancy? > Full mechanical vent support: wean PEEP/FiO2 for paO2 55-65 (wean now) > hopefully stop nimbex today after dropping PEEP and repeating ABG > VAP prevention > Daily WUA/SBT > furosemide x2 doses today > continue solumedrol  Acute pulmonary embolism: subsegmental, in presence of malignancy > start heparin infusion, no bolus > monitor carefully for bleeding  Symptomatic anemia: > monitor carefully on heparin  Circulatory shock > wean off levophed for MAP > 65  Hyperglycemia: worse > change to SSI  HCAP? > continue cefepime  AKI: > monitor UOP > make euvolemic today > stop bicarbonate infusion  Small cell lung cancer  Prognosis guarded  Best practice:  Diet: tube feeding Pain/Anxiety/Delirium protocol (if indicated): RASS Goal -5 per nimbex protocol VAP protocol (if indicated): yes DVT prophylaxis: heparin GI prophylaxis: famotidine Glucose control: SSI as above Mobility: bed rest Code Status: full Family Communication: husband not at bedside 2/18 Disposition: remain in ICU  Labs   CBC: Recent Labs  Lab 07/13/2018 1630  07/03/18 0429  07/04/18 0331  07/04/18 2325 07/05/18 0404  07/06/18 0415  07/06/18  1345 07/06/18 1401 07/06/18 2257 07/07/18 0413 07/07/18 0532  WBC 9.8   < > 8.9   < > 9.3  --  14.4* 14.4*  --  10.2  --   --   --   --   --  10.2  NEUTROABS 7.7  --  7.3  --   --   --   --  10.0*  --  7.8*  --   --   --   --   --  8.6*  HGB 9.8*   < > 7.9*   < > 7.3*   < > 7.3* 7.2*   < > 7.2*   < > 6.9* 6.1* 8.9* 8.2* 8.4*  HCT 31.3*   < > 25.9*   < >  24.1*   < > 24.3* 24.3*   < > 23.8*   < > 23.5* 18.0* 29.3* 24.0* 27.7*  MCV 97.5   < > 99.2   < > 100.4*  --  100.8* 100.4*  --  102.1*  --   --   --   --   --  99.6  PLT 129*   < > 110*   < > 108*  --  130* 129*  --  95*  --   --   --   --   --  95*   < > = values in this interval not displayed.    Basic Metabolic Panel: Recent Labs  Lab 07/03/18 1801 07/04/18 0331  07/05/18 0404  07/06/18 0021 07/06/18 0415 07/06/18 1126 07/06/18 1401 07/06/18 2132 07/07/18 0413 07/07/18 0452  NA  --  146*   < > 146*   < > 142 143 145 145 141 144 145  K  --  3.4*   < > 4.0   < > 4.4 4.4 4.5 4.7 4.8 4.6 4.4  CL  --  117*   < > 118*  --  117* 115*  --   --  114*  --  112*  CO2  --  23   < > 17*  --  20* 21*  --   --  22  --  24  GLUCOSE  --  124*   < > 188*  --  162* 178*  --   --  243*  --  303*  BUN  --  17   < > 26*  --  31* 33*  --   --  45*  --  54*  CREATININE  --  1.12*   < > 1.60*  --  1.75* 1.83*  --   --  2.45*  --  2.71*  CALCIUM  --  7.7*   < > 8.1*  --  8.0* 7.9*  --   --  7.7*  --  7.6*  MG 1.8 1.6*  --  2.5*  --   --  2.3  --   --  2.4  --  2.4  PHOS 3.8 2.9  --  3.4  --   --  3.7  --   --   --   --  3.9  3.9   < > = values in this interval not displayed.   GFR: Estimated Creatinine Clearance: 20.9 mL/min (A) (by C-G formula based on SCr of 2.71 mg/dL (H)). Recent Labs  Lab 07/03/2018 1630 06/23/2018 1850 06/23/2018 2255 07/02/18 0228 07/03/18 0429  07/04/18 2325 07/05/18 0404 07/06/18 0415 07/07/18 0532  PROCALCITON  --   --   --  0.39  0.53  --   --   --   --   --   WBC 9.8  --   --  9.4 8.9   < > 14.4* 14.4* 10.2 10.2  LATICACIDVEN 2.2* 1.2 0.9  --   --   --  1.3  --   --   --    < > = values in this interval not displayed.    Liver Function Tests: Recent Labs  Lab 07/11/2018 1630 07/02/18 0228 07/03/18 0429 07/05/18 0404 07/06/18 0415 07/07/18 0452  AST _0 --   ALT _1 --   ALKPHOS 82 93 131* 129*  138* 122  --   BILITOT 1.3*  1.5* 1.8* 0.9  0.9 0.7  --   PROT 6.2* 5.7* 5.2* 5.3*  5.2* 5.0*  --   ALBUMIN 2.5* 2.3* 1.9* 1.5*  1.5* 1.4* 1.4*   No results for input(s): LIPASE, AMYLASE in the last 168 hours. No results for input(s): AMMONIA in the last 168 hours.  ABG    Component Value Date/Time   PHART 7.208 (L) 07/07/2018 0413   PCO2ART 53.9 (H) 07/07/2018 0413   PO2ART 96.0 07/07/2018 0413   HCO3 21.5 07/07/2018 0413   TCO2 23 07/07/2018 0413   ACIDBASEDEF 6.0 (H) 07/07/2018 0413   O2SAT 96.0 07/07/2018 0413     Coagulation Profile: Recent Labs  Lab 06/30/2018 1630 07/05/18 0404  INR 3.16 1.47    Cardiac Enzymes: Recent Labs  Lab 07/05/18 0404  TROPONINI 0.06*    HbA1C: Hgb A1c MFr Bld  Date/Time Value Ref Range Status  06/27/2018 06:41 AM 6.5 (H) 4.8 - 5.6 % Final    Comment:    (NOTE) Pre diabetes:          5.7%-6.4% Diabetes:              >6.4% Glycemic control for   <7.0% adults with diabetes     CBG: Recent Labs  Lab 07/06/18 2358 07/07/18 0318 07/07/18 0755 07/07/18 0832 07/07/18 0840  GLUCAP 254* 280* 260* 403* 316*     Critical care time: 45 minutes       Roselie Awkward, MD [facility] Pager: [phone] Cell: [phone] If no response, call [phone]

## 2018-07-07 NOTE — Progress Notes (Signed)
Inpatient Diabetes Program Recommendations  AACE/ADA: New Consensus Statement on Inpatient Glycemic Control (2015)  Target Ranges:  Prepandial:   less than 140 mg/dL      Peak postprandial:   less than 180 mg/dL (1-2 hours)      Critically ill patients:  140 - 180 mg/dL   Lab Results  Component Value Date   GLUCAP 316 (H) 07/07/2018   HGBA1C 6.5 (H) 06/27/2018    Review of Glycemic Control Results for Michele, [name] (MRN [id number]) as of 07/07/2018 10:13  Ref. Range 07/06/2018 23:58 07/07/2018 03:18 07/07/2018 07:55 07/07/2018 08:32 07/07/2018 08:40  Glucose-Capillary Latest Ref Range: 70 - 99 mg/dL 254 (H) 280 (H) 260 (H) 403 (H) 316 (H)   Diabetes history: No known hx Current orders for Inpatient glycemic control: Novolog moderate correction 0-15 units q 4 hrs  Inpatient Diabetes Program Recommendations:   While patient on ventilator: -ICU Adult glycemic control orders  Add Novolog 4 units q 4 hrs tube feed coverage and hold if tube feed stopped or held for any reason  Thank you, Bethena Roys E. Ramie Erman, RN, MSN, CDE  Diabetes Coordinator Inpatient Glycemic Control Team Team Pager [phone] (8am-5pm) 07/07/2018 10:16 AM

## 2018-07-07 NOTE — Telephone Encounter (Signed)
See previous phone notes. This encounter can be closed.

## 2018-07-07 NOTE — Progress Notes (Signed)
[facility] P[facility]  ABG reviewed after reducing PEEP/FiO2: paO2 still above goal  Will stop nimbex Continue close monitoring  Roselie Awkward, MD [place] [facility] Pager: [phone] Cell: [phone] If no response, call [phone]

## 2018-07-07 NOTE — Progress Notes (Signed)
ANTICOAGULATION CONSULT NOTE  Pharmacy Consult for Heparin Indication: pulmonary embolus   No Known Allergies  Patient Measurements: TBW: 94.1 kg Heparin Dosing Weight: 77 kg  Vital Signs: Temp: 97.7 F (36.5 C) (02/18 0831) Temp Source: Oral (02/18 0831) BP: 121/57 (02/18 0900) Pulse Rate: 75 (02/18 0930)  Labs: Recent Labs    07/05/18 0404  07/06/18 0415  07/06/18 2132 07/06/18 2257 07/07/18 0413 07/07/18 0452 07/07/18 0532  HGB 7.2*   < > 7.2*   < >  --  8.9* 8.2*  --  8.4*  HCT 24.3*   < > 23.8*   < >  --  29.3* 24.0*  --  27.7*  PLT 129*  --  95*  --   --   --   --   --  95*  LABPROT 17.6*  --   --   --   --   --   --   --   --   INR 1.47  --   --   --   --   --   --   --   --   CREATININE 1.60*   < > 1.83*  --  2.45*  --   --  2.71*  --   TROPONINI 0.06*  --   --   --   --   --   --   --   --    < > = values in this interval not displayed.   Estimated Creatinine Clearance: 20.9 mL/min (A) (by C-G formula based on SCr of 2.71 mg/dL (H)).  Assessment: 71 yoF on Xarelto PTA for h/o afib and stroke. Found to have acute PE on admission and transitioned to treatment dose enoxaparin. Decrease in hemoglobin to 6.1 yesterday PM and enoxaparin was discontinued. Pharmacy now consulted to transition to IV heparin. 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 No active bleeding noted.  Goal of Therapy:  Heparin level 0.3-0.7 Monitor platelets by anticoagulation protocol: Yes   Plan:  No heparin bolus Start heparin gtt at 1300 units/hr Check heparin level in 8 hours Daily heparin level and CBC Monitor closely for s/sx of bleeding  Erin N. Gerarda Fraction, PharmD, [place] PGY2 Infectious Diseases Pharmacy Resident Phone: [phone] 07/07/2018,9:34 AM

## 2018-07-07 NOTE — Progress Notes (Signed)
eLink Physician-Brief Progress Note Patient Name: Michele [name] DOB: 12-23-1945 MRN: [id number]   Date of Service  07/07/2018  HPI/Events of Note  ABG on 70%/PRVC 35/TV 380/P 12 = 7.208/53.9/96.0.   eICU Interventions  Will order: 1. NaHCO3 50 meq IV now.  2. Increase NaHCO3 IV infusion to 100 mL/hour.      Intervention Category Major Interventions: Acid-Base disturbance - evaluation and management;Respiratory failure - evaluation and management  Sommer,Steven [name] 07/07/2018, 4:24 AM

## 2018-07-08 ENCOUNTER — Inpatient Hospital Stay (HOSPITAL_COMMUNITY): Payer: Medicare Other

## 2018-07-08 LAB — CBC WITH DIFFERENTIAL/PLATELET
Abs Immature Granulocytes: 0.53 10*3/uL — ABNORMAL HIGH (ref 0.00–0.07)
Basophils Absolute: 0 10*3/uL (ref 0.0–0.1)
Basophils Relative: 0 %
Eosinophils Absolute: 0.1 10*3/uL (ref 0.0–0.5)
Eosinophils Relative: 1 %
HCT: 25.8 % — ABNORMAL LOW (ref 36.0–46.0)
Hemoglobin: 8 g/dL — ABNORMAL LOW (ref 12.0–15.0)
Immature Granulocytes: 5 %
Lymphocytes Relative: 7 %
Lymphs Abs: 0.7 10*3/uL (ref 0.7–4.0)
MCH: 30.7 pg (ref 26.0–34.0)
MCHC: 31 g/dL (ref 30.0–36.0)
MCV: 98.9 fL (ref 80.0–100.0)
MONO ABS: 0.8 10*3/uL (ref 0.1–1.0)
Monocytes Relative: 8 %
Neutro Abs: 7.8 10*3/uL — ABNORMAL HIGH (ref 1.7–7.7)
Neutrophils Relative %: 79 %
PLATELETS: 78 10*3/uL — AB (ref 150–400)
RBC: 2.61 MIL/uL — ABNORMAL LOW (ref 3.87–5.11)
RDW: 17.1 % — AB (ref 11.5–15.5)
WBC: 9.9 10*3/uL (ref 4.0–10.5)
nRBC: 1.3 % — ABNORMAL HIGH (ref 0.0–0.2)

## 2018-07-08 LAB — POCT I-STAT 7, (LYTES, BLD GAS, ICA,H+H)
Acid-base deficit: 2 mmol/L (ref 0.0–2.0)
Acid-base deficit: 3 mmol/L — ABNORMAL HIGH (ref 0.0–2.0)
Bicarbonate: 23.3 mmol/L (ref 20.0–28.0)
Bicarbonate: 23.3 mmol/L (ref 20.0–28.0)
Calcium, Ion: 1.13 mmol/L — ABNORMAL LOW (ref 1.15–1.40)
Calcium, Ion: 1.13 mmol/L — ABNORMAL LOW (ref 1.15–1.40)
HCT: 20 % — ABNORMAL LOW (ref 36.0–46.0)
HCT: 22 % — ABNORMAL LOW (ref 36.0–46.0)
Hemoglobin: 6.8 g/dL — CL (ref 12.0–15.0)
Hemoglobin: 7.5 g/dL — ABNORMAL LOW (ref 12.0–15.0)
O2 Saturation: 95 %
O2 Saturation: 86 %
Patient temperature: 97.6
Potassium: 4.3 mmol/L (ref 3.5–5.1)
Potassium: 4.4 mmol/L (ref 3.5–5.1)
Sodium: 145 mmol/L (ref 135–145)
Sodium: 145 mmol/L (ref 135–145)
TCO2: 25 mmol/L (ref 22–32)
TCO2: 25 mmol/L (ref 22–32)
pCO2 arterial: 41 mmHg (ref 32.0–48.0)
pCO2 arterial: 43.4 mmHg (ref 32.0–48.0)
pH, Arterial: 7.359 (ref 7.350–7.450)
pH, Arterial: 7.334 — ABNORMAL LOW (ref 7.350–7.450)
pO2, Arterial: 79 mmHg — ABNORMAL LOW (ref 83.0–108.0)
pO2, Arterial: 54 mmHg — ABNORMAL LOW (ref 83.0–108.0)

## 2018-07-08 LAB — GLUCOSE, CAPILLARY
GLUCOSE-CAPILLARY: 157 mg/dL — AB (ref 70–99)
Glucose-Capillary: 136 mg/dL — ABNORMAL HIGH (ref 70–99)
Glucose-Capillary: 139 mg/dL — ABNORMAL HIGH (ref 70–99)
Glucose-Capillary: 157 mg/dL — ABNORMAL HIGH (ref 70–99)
Glucose-Capillary: 166 mg/dL — ABNORMAL HIGH (ref 70–99)
Glucose-Capillary: 171 mg/dL — ABNORMAL HIGH (ref 70–99)

## 2018-07-08 LAB — BASIC METABOLIC PANEL
Anion gap: 11 (ref 5–15)
BUN: 74 mg/dL — ABNORMAL HIGH (ref 8–23)
CO2: 23 mmol/L (ref 22–32)
CREATININE: 3.22 mg/dL — AB (ref 0.44–1.00)
Calcium: 7.6 mg/dL — ABNORMAL LOW (ref 8.9–10.3)
Chloride: 111 mmol/L (ref 98–111)
GFR calc Af Amer: 16 mL/min — ABNORMAL LOW (ref >60)
GFR calc non Af Amer: 14 mL/min — ABNORMAL LOW (ref >60)
GLUCOSE: 167 mg/dL — AB (ref 70–99)
Potassium: 4.4 mmol/L (ref 3.5–5.1)
Sodium: 145 mmol/L (ref 135–145)

## 2018-07-08 LAB — APTT: aPTT: >200 seconds (ref 24–36)

## 2018-07-08 LAB — PHOSPHORUS: Phosphorus: 3.9 mg/dL (ref 2.5–4.6)

## 2018-07-08 LAB — HEPARIN LEVEL (UNFRACTIONATED): Heparin Unfractionated: 1.34 IU/mL — ABNORMAL HIGH (ref 0.30–0.70)

## 2018-07-08 LAB — MAGNESIUM: Magnesium: 2.4 mg/dL (ref 1.7–2.4)

## 2018-07-08 MED ORDER — VITAL AF 1.2 CAL PO LIQD
1000.0000 mL | ORAL | Status: DC
  Administered 2018-07-08 – 2018-07-14 (×4): 1000 mL

## 2018-07-08 MED ORDER — ORAL CARE MOUTH RINSE: 15.0000 mL | OROMUCOSAL | Status: DC

## 2018-07-08 MED ORDER — CHLORHEXIDINE GLUCONATE 0.12% ORAL RINSE (MEDLINE KIT): 15.0000 mL | Freq: Two times a day (BID) | OROMUCOSAL | Status: DC

## 2018-07-08 MED ORDER — BISACODYL 10 MG RE SUPP
10.0000 mg | Freq: Once | RECTAL | Status: AC
  Administered 2018-07-08: 10 mg via RECTAL
  Filled 2018-07-08: qty 1

## 2018-07-08 MED ORDER — FAMOTIDINE 40 MG/5ML PO SUSR
20.0000 mg | Freq: Every day | ORAL | Status: DC
  Administered 2018-07-08 – 2018-07-15 (×8): 20 mg
  Filled 2018-07-08 (×8): qty 2.5

## 2018-07-08 NOTE — Progress Notes (Signed)
Nutrition Follow-up  DOCUMENTATION CODES:   Not applicable  INTERVENTION:   -Recommend addition of prokinetic agent  Tube feeding:  -Trickle Vital AF 1.2 @ 20 ml/hr per CCM -Advance to goal rate of 50 ml/hr per toleration  -30 ml Prostat BID  At goal TF provides: 1640 kcals, 120 grams protein, 972 ml free water.   NUTRITION DIAGNOSIS:   Inadequate oral intake related to acute illness as evidenced by NPO status.  Ongoing  GOAL:   Patient will meet greater than or equal to 90% of their needs  Not met- addressed with TF  MONITOR:   TF tolerance, Vent status, Labs, Weight trends  REASON FOR ASSESSMENT:   Consult Enteral/tube feeding initiation and management  ASSESSMENT:   73 yo female admitted with sepsis and acute respiratory due to profound multilobar pneumonia, LLL lung adenocarcinoma s/p XRT and chemo (finished November 2019). PMH includes HTN, HLD, CVA   Nimbex d/c. RN reports pt had episode of tan color secretions pooling in her mouth yesterday. They pulled two liters of gastric content in a 24 hr hour period. Pt has not had BM since admit despite being on bowel regimen. Plan to start trickle today per CCM. Recommend addition of prokinetic agent. Will advance once toleration is established.   Weight noted to increase from 81.3 kg on 2/15 to 92.5 kg today. Lasix have been held due to worsening renal function. Per CCM, pt likely not a candidate for HD. Goals of care to be discussed.   Patient remains intubated on ventilator support MV: 13.3 L/min Temp (24hrs), Avg:97.7 F (36.5 C), Min:97.1 F (36.2 C), Max:98.3 F (36.8 C) BP: 133/66 MAP: 86  I/O: +20 L since admit UOP: 705 ml x 24 hrs  OGT: 2250 ml x 24 hrs   Medications reviewed and include: colace, SS novolog, solumedrol, senokot, miralax, levophed Labs reviewed: calcium ionized 1.13 (L) CBG 167-250   Diet Order:   Diet Order            Diet NPO time specified  Diet effective now               EDUCATION NEEDS:   Not appropriate for education at this time  Skin:  Skin Assessment: Reviewed RN Assessment  Last BM:  2/10- consitpated   Height:   Ht Readings from Last 1 Encounters:  07/04/18 5' 4.02" (1.626 m)    Weight:   Wt Readings from Last 1 Encounters:  07/08/18 92.5 kg    Ideal Body Weight:  54.5 kg  BMI:  Body mass index is 34.99 kg/m.  Estimated Nutritional Needs:   Kcal:  1759 kcal  Protein:  115-130 g  Fluid:  >/= 1.7 L   Mariana Single RD, LDN Clinical Nutrition Pager # [phone]

## 2018-07-08 NOTE — Progress Notes (Signed)
NAME:  Michele [name], MRN:  [id number], DOB:  November 29, 1945, LOS: 7 ADMISSION DATE:  06/24/2018, CONSULTATION DATE:  07/03/2018 REFERRING MD:  Thereasa Solo, CHIEF COMPLAINT:  Dyspnea   Brief History   73 y/o female with small cel lung cancer treated with etoposide and carboplatin completed 05/2018 admitted for acute hypoxemic respiratory failure with progressive infiltrates and a PE, required intubation.    Past Medical History  Small cell lung cancer  Significant Hospital Events   2/13 - echo - diast dysfn, EF 65% 2/14 respiratory failure 2/15 -  1005 fio2/peep 5 . Fever 101F WBC 10. Dysnch with vent. On fent gtt. Not on pressors. ECHO 07/02/2018 - ef 65%. Has new AKI creat 1.12. PCT 0.5 and borderline  Consults:  PCCM  Procedures:  Portocath long standing ETT 2/14>>> 2/16 PICC left arm>   Significant Diagnostic Tests:  2/12 CT angiogram chest > small subsegmental PE, diffuse bilateral infiltrates  Micro Data:  Blood 2/14>>> Urine 2/14>>> Sputum 2/14>>> BAL 2/14>>> RV  21/12 - neg Urine strep negative   Antimicrobials:  Cefepime 2/14>>> Vancomycin 2/14>>> 2/14  Interim history/subjective:   Still on low dose levophed Oxygenation improved Some oozing from mouth after starting heparin Increased OG secretions yesterday   Objective   Blood pressure 133/67, pulse (!) 56, temperature 97.6 F (36.4 C), temperature source Oral, resp. rate (!) 28, height 5' 4.02" (1.626 m), weight 92.5 kg, SpO2 97 %.    Vent Mode: PRVC FiO2 (%):  [40 %-60 %] 50 % Set Rate:  [35 bmp] 35 bmp Vt Set:  [380 mL] 380 mL PEEP:  [10 cmH20] 10 cmH20 Plateau Pressure:  [29 cmH20-34 cmH20] 31 cmH20   Intake/Output Summary (Last 24 hours) at 07/08/2018 0950 Last data filed at 07/08/2018 0900 Gross per 24 hour  Intake 2148.88 ml  Output 2930 ml  Net -781.12 ml   Filed Weights   07/06/18 0417 07/07/18 0443 07/08/18 0417  Weight: 88.9 kg 94.1 kg 92.5 kg    Examination:  General:  In bed on  vent HENT: NCAT ETT in place PULM: CTA B, vent supported breathing CV: RRR, no mgr GI: BS+, soft, nontender MSK: normal bulk and tone Neuro: sedated on vent Derm: thin skin, some edema     Resolved Hospital Problem list     Assessment & Plan:  Acute respiratory failure with hypoxemia/ARDS Diffuse bilateral infiltrates: drug reaction? Recurrent malignancy? > Continue solumedrol > repeat ABG now > wean PEEP/FiO2 for target 55-65 paO2 > full mechanical vent support > daily WUA/SBT > hold furosemide today > VAP prevention > restart famotidine  Acute pulmonary embolism: subsegmental, in presence of malignancy: complicated by bleeding twice now (drop in Hgb on 2/17, oozing 2/19); given subsegmental emboli, benefit of treatment does not outweigh risks > hold heparin/anticoagulation  Symptomatic anemia: > monitor for bleeding  Circulatory shock > wean off levophed for MAP > 65  Hyperglycemia: worse > change to SSI  HCAP? > stop cefepime  AKI: worse today > hold lasix > will discuss HD with family, I don't think she is a very good candidate for that  Small cell lung cancer  Prognosis guarded  Best practice:  Diet: tube feeding Pain/Anxiety/Delirium protocol (if indicated): RASS Goal -5 per nimbex protocol VAP protocol (if indicated): yes DVT prophylaxis: heparin GI prophylaxis: famotidine Glucose control: SSI as above Mobility: bed rest Code Status: full Family Communication: I tried to call husband on 2/19, had to leave a message Disposition: remain in ICU  Labs  CBC: Recent Labs  Lab 07/03/18 0429  07/04/18 2325 07/05/18 0404  07/06/18 0415  07/06/18 2257 07/07/18 0413 07/07/18 0532 07/07/18 1124 07/08/18 0427  WBC 8.9   < > 14.4* 14.4*  --  10.2  --   --   --  10.2  --  9.9  NEUTROABS 7.3  --   --  10.0*  --  7.8*  --   --   --  8.6*  --  7.8*  HGB 7.9*   < > 7.3* 7.2*   < > 7.2*   < > 8.9* 8.2* 8.4* 6.8* 8.0*  HCT 25.9*   < > 24.3* 24.3*   < >  23.8*   < > 29.3* 24.0* 27.7* 20.0* 25.8*  MCV 99.2   < > 100.8* 100.4*  --  102.1*  --   --   --  99.6  --  98.9  PLT 110*   < > 130* 129*  --  95*  --   --   --  95*  --  78*   < > = values in this interval not displayed.    Basic Metabolic Panel: Recent Labs  Lab 07/04/18 0331  07/05/18 0404  07/06/18 0415  07/06/18 2132 07/07/18 0413 07/07/18 0452 07/07/18 1124 07/07/18 1958 07/08/18 0427  NA 146*   < > 146*   < > 143   < > 141 144 145 145 145 145  K 3.4*   < > 4.0   < > 4.4   < > 4.8 4.6 4.4 4.0 4.2 4.4  CL 117*   < > 118*   < > 115*  --  114*  --  112*  --  109 111  CO2 23   < > 17*   < > 21*  --  22  --  24  --  22 23  GLUCOSE 124*   < > 188*   < > 178*  --  243*  --  303*  --  250* 167*  BUN 17   < > 26*   < > 33*  --  45*  --  54*  --  66* 74*  CREATININE 1.12*   < > 1.60*   < > 1.83*  --  2.45*  --  2.71*  --  2.94* 3.22*  CALCIUM 7.7*   < > 8.1*   < > 7.9*  --  7.7*  --  7.6*  --  7.5* 7.6*  MG 1.6*  --  2.5*  --  2.3  --  2.4  --  2.4  --  2.6* 2.4  PHOS 2.9  --  3.4  --  3.7  --   --   --  3.9  3.9  --   --  3.9   < > = values in this interval not displayed.   GFR: Estimated Creatinine Clearance: 17.4 mL/min (A) (by C-G formula based on SCr of 3.22 mg/dL (H)). Recent Labs  Lab 07/04/2018 1630 06/25/2018 1850 06/20/2018 2255 07/02/18 0228 07/03/18 0429  07/04/18 2325 07/05/18 0404 07/06/18 0415 07/07/18 0532 07/08/18 0427  PROCALCITON  --   --   --  0.39 0.53  --   --   --   --   --   --   WBC 9.8  --   --  9.4 8.9   < > 14.4* 14.4* 10.2 10.2 9.9  LATICACIDVEN 2.2* 1.2 0.9  --   --   --  1.3  --   --   --   --    < > = values in this interval not displayed.    Liver Function Tests: Recent Labs  Lab 07/12/2018 1630 07/02/18 0228 07/03/18 0429 07/05/18 0404 07/06/18 0415 07/07/18 0452  AST _0 --   ALT _1 --   ALKPHOS 82 93 131* 129*  138* 122  --   BILITOT 1.3* 1.5* 1.8* 0.9  0.9 0.7  --   PROT 6.2* 5.7* 5.2* 5.3*   5.2* 5.0*  --   ALBUMIN 2.5* 2.3* 1.9* 1.5*  1.5* 1.4* 1.4*   No results for input(s): LIPASE, AMYLASE in the last 168 hours. No results for input(s): AMMONIA in the last 168 hours.  ABG    Component Value Date/Time   PHART 7.280 (L) 07/07/2018 1124   PCO2ART 50.6 (H) 07/07/2018 1124   PO2ART 68.0 (L) 07/07/2018 1124   HCO3 23.9 07/07/2018 1124   TCO2 25 07/07/2018 1124   ACIDBASEDEF 3.0 (H) 07/07/2018 1124   O2SAT 91.0 07/07/2018 1124     Coagulation Profile: Recent Labs  Lab 06/30/2018 1630 07/05/18 0404  INR 3.16 1.47    Cardiac Enzymes: Recent Labs  Lab 07/05/18 0404  TROPONINI 0.06*    HbA1C: Hgb A1c MFr Bld  Date/Time Value Ref Range Status  06/27/2018 06:41 AM 6.5 (H) 4.8 - 5.6 % Final    Comment:    (NOTE) Pre diabetes:          5.7%-6.4% Diabetes:              >6.4% Glycemic control for   <7.0% adults with diabetes     CBG: Recent Labs  Lab 07/07/18 1632 07/07/18 1954 07/07/18 2349 07/08/18 0347 07/08/18 0814  GLUCAP 261* 210* 177* 157* 136*     Critical care time: 35 minutes       Roselie Awkward, MD [place] [facility] Pager: [phone] Cell: [phone] If no response, call [phone]

## 2018-07-08 NOTE — Progress Notes (Signed)
eLink Physician-Brief Progress Note Patient Name: Michele [name] DOB: [birth year] MRN: [id number]   Date of Service  07/08/2018  HPI/Events of Note  Gastric residual = 550 mL.  eICU Interventions  Please place NG/OG tube to LIS.      Intervention Category Major Interventions: Other:  Zyeir Dymek [name] 07/08/2018, 12:05 AM

## 2018-07-08 NOTE — Progress Notes (Signed)
PTT >200 per critical value result call.  Suezanne Jacquet, Pharmacist, notified. Ordered to stop Heparin until further notification.  Donnetta Simpers, RN

## 2018-07-08 NOTE — Progress Notes (Signed)
Responded to Consult by Mikki Santee. PT was intubated and no family present. Nurse stated that PT has large family support group. Also, that Doristine Bosworth may have come to visit. She stated that PT's kids called for updates earlier in the day. Nurse is in constant contact with family. She said she will page Chaplain if the need arrives this afternoon. Chaplain available upon request.  Chaplain Fidel Levy  [phone]

## 2018-07-08 NOTE — Progress Notes (Addendum)
ANTICOAGULATION CONSULT NOTE  Pharmacy Consult for Heparin Indication: pulmonary embolus   No Known Allergies  Patient Measurements: TBW: 94.1 kg Heparin Dosing Weight: 77 kg  Vital Signs: Temp: 97.6 F (36.4 C) (02/19 0346) Temp Source: Oral (02/19 0346) BP: 131/71 (02/19 0700) Pulse Rate: 60 (02/19 0700)  Labs: Recent Labs    07/06/18 0415  07/07/18 0413 07/07/18 0452 07/07/18 0532 07/07/18 1124 07/07/18 1958 07/08/18 0427 07/08/18 0652  HGB 7.2*   < > 8.2*  --  8.4* 6.8*  --   --   --   HCT 23.8*   < > 24.0*  --  27.7* 20.0*  --   --   --   PLT 95*  --   --   --  95*  --   --   --   --   APTT  --   --   --   --   --   --   --   --  >200*  HEPARINUNFRC  --   --   --   --   --   --  1.50* 1.34*  --   CREATININE 1.83*   < >  --  2.71*  --   --  2.94* 3.22*  --    < > = values in this interval not displayed.   Estimated Creatinine Clearance: 17.4 mL/min (A) (by C-G formula based on SCr of 3.22 mg/dL (H)).  Assessment: 23 yoF on Xarelto PTA for h/o afib and stroke. Found to have acute PE on admission and transitioned to treatment dose enoxaparin. Decrease in hemoglobin to 6.1 yesterday PM and enoxaparin was discontinued. Pharmacy now consulted to transition to IV heparin. Heparin level remains above goal at 1.34 this AM after drip held for 1 hour and rate decrease overnight. Given history of Xarelto use, checked aPTT to evaluate if there was any residual effect on heparin level - aPTT also >200, correlating with high heparin level. Per RN, patient noted to have significant oozing. Hgb 8 s/p 1 unit PRBC, pltc down 78 (chronic TTP).   Goal of Therapy:  Heparin level 0.3-0.7 Monitor platelets by anticoagulation protocol: Yes   Plan:  Hold heparin x1 hour and re-evaluate oozing Will resume heparin gtt at a much lower rate after oozing resolves  ADDENDUM: Per RN, patient continues to ooze slightly from the mouth during oral care. D/w Dr. [place] Bells, risk vs. benefit of  continuing to treat subsegmental PE in the setting of oozing and worsening thrombocytopenia. Will hold heparin for now.  Mila Merry Gerarda Fraction, PharmD, [place] PGY2 Infectious Diseases Pharmacy Resident Phone: [phone] 07/08/2018 8:08 AM

## 2018-07-09 ENCOUNTER — Inpatient Hospital Stay (HOSPITAL_COMMUNITY): Payer: Medicare Other

## 2018-07-09 LAB — CBC WITH DIFFERENTIAL/PLATELET
Abs Immature Granulocytes: 0.75 10*3/uL — ABNORMAL HIGH (ref 0.00–0.07)
BASOS ABS: 0 10*3/uL (ref 0.0–0.1)
Basophils Relative: 0 %
Eosinophils Absolute: 0 10*3/uL (ref 0.0–0.5)
Eosinophils Relative: 0 %
HCT: 25.2 % — ABNORMAL LOW (ref 36.0–46.0)
Hemoglobin: 7.8 g/dL — ABNORMAL LOW (ref 12.0–15.0)
Immature Granulocytes: 6 %
Lymphocytes Relative: 5 %
Lymphs Abs: 0.6 10*3/uL — ABNORMAL LOW (ref 0.7–4.0)
MCH: 30.7 pg (ref 26.0–34.0)
MCHC: 31 g/dL (ref 30.0–36.0)
MCV: 99.2 fL (ref 80.0–100.0)
Monocytes Absolute: 1 10*3/uL (ref 0.1–1.0)
Monocytes Relative: 8 %
Neutro Abs: 9.6 10*3/uL — ABNORMAL HIGH (ref 1.7–7.7)
Neutrophils Relative %: 81 %
Platelets: 87 10*3/uL — ABNORMAL LOW (ref 150–400)
RBC: 2.54 MIL/uL — ABNORMAL LOW (ref 3.87–5.11)
RDW: 16.8 % — ABNORMAL HIGH (ref 11.5–15.5)
WBC: 12 10*3/uL — ABNORMAL HIGH (ref 4.0–10.5)
nRBC: 1.2 % — ABNORMAL HIGH (ref 0.0–0.2)

## 2018-07-09 LAB — BASIC METABOLIC PANEL
Anion gap: 13 (ref 5–15)
BUN: 92 mg/dL — ABNORMAL HIGH (ref 8–23)
CO2: 20 mmol/L — ABNORMAL LOW (ref 22–32)
Calcium: 7.6 mg/dL — ABNORMAL LOW (ref 8.9–10.3)
Chloride: 112 mmol/L — ABNORMAL HIGH (ref 98–111)
Creatinine, Ser: 3.68 mg/dL — ABNORMAL HIGH (ref 0.44–1.00)
GFR calc Af Amer: 13 mL/min — ABNORMAL LOW (ref >60)
GFR, EST NON AFRICAN AMERICAN: 12 mL/min — AB (ref >60)
GLUCOSE: 182 mg/dL — AB (ref 70–99)
Potassium: 4.8 mmol/L (ref 3.5–5.1)
Sodium: 145 mmol/L (ref 135–145)

## 2018-07-09 LAB — GLUCOSE, CAPILLARY
GLUCOSE-CAPILLARY: 152 mg/dL — AB (ref 70–99)
GLUCOSE-CAPILLARY: 175 mg/dL — AB (ref 70–99)
GLUCOSE-CAPILLARY: 170 mg/dL — AB (ref 70–99)
Glucose-Capillary: 150 mg/dL — ABNORMAL HIGH (ref 70–99)
Glucose-Capillary: 161 mg/dL — ABNORMAL HIGH (ref 70–99)

## 2018-07-09 MED ORDER — METHYLPREDNISOLONE SODIUM SUCC 125 MG IJ SOLR
60.0000 mg | Freq: Four times a day (QID) | INTRAMUSCULAR | Status: DC
  Administered 2018-07-09 – 2018-07-11 (×8): 60 mg via INTRAVENOUS
  Filled 2018-07-09 (×7): qty 2

## 2018-07-09 MED ORDER — ALBUMIN HUMAN 25 % IV SOLN
50.0000 g | Freq: Once | INTRAVENOUS | Status: AC
  Administered 2018-07-09: 50 g via INTRAVENOUS
  Filled 2018-07-09: qty 200

## 2018-07-09 NOTE — Progress Notes (Signed)
NAME:  Michele [name], MRN:  [id number], DOB:  02-May-1946, LOS: 8 ADMISSION DATE:  07/02/2018, CONSULTATION DATE:  07/03/2018 REFERRING MD:  Thereasa Solo, CHIEF COMPLAINT:  Dyspnea   Brief History   73 y/o female with small cel lung cancer treated with etoposide and carboplatin completed 05/2018 admitted for acute hypoxemic respiratory failure with progressive infiltrates and a PE, required intubation.    Past Medical History  Small cell lung cancer  Significant Hospital Events   2/13 - echo - diast dysfn, EF 65% 2/14 respiratory failure 2/15 -  1005 fio2/peep 5 . Fever 101F WBC 10. Dysnch with vent. On fent gtt. Not on pressors. ECHO 07/02/2018 - ef 65%. Has new AKI creat 1.12. PCT 0.5 and borderline  Consults:  PCCM  Procedures:  Portocath long standing ETT 2/14>>> 2/16 PICC left arm>   Significant Diagnostic Tests:  2/12 CT angiogram chest > small subsegmental PE, diffuse bilateral infiltrates  Micro Data:  Blood 2/14>>> negative Urine 2/14>>> negative Sputum 2/14>>> negative BAL 2/14>>> negative RV  21/12 - neg Urine strep negative   Antimicrobials:  Cefepime 2/14>>> off Vancomycin 2/14>>> 2/14  Interim history/subjective:   Remains on Levophed    Objective   Blood pressure (!) 115/58, pulse 63, temperature (!) 96.7 F (35.9 C), temperature source Axillary, resp. rate (!) 23, height 5' 4.02" (1.626 m), weight 92.8 kg, SpO2 94 %.    Vent Mode: PRVC FiO2 (%):  [40 %-60 %] 50 % Set Rate:  [24 bmp] 24 bmp Vt Set:  [380 mL] 380 mL PEEP:  [10 cmH20] 10 cmH20 Plateau Pressure:  [17 cmH20-31 cmH20] 26 cmH20   Intake/Output Summary (Last 24 hours) at 07/09/2018 1015 Last data filed at 07/09/2018 0700 Gross per 24 hour  Intake 961.97 ml  Output 265 ml  Net 696.97 ml   Filed Weights   07/07/18 0443 07/08/18 0417 07/09/18 0148  Weight: 94.1 kg 92.5 kg 92.8 kg    Examination:  General: Elderly female currently sedated on full mechanical ventilatory  support HEENT: Tracheal tube in place Neuro: Heavily sedated CV: s1s2 rrr, no m/r/g PULM: Rhonchi bilaterally with decreased breath sounds in the bases OV:FIEP, non-tender, bsx4 active  Extremities: warm/dry, 1+ edema  Skin: no rashes or lesions     Resolved Hospital Problem list     Assessment & Plan:  Acute respiratory failure with hypoxemia/ARDS Diffuse bilateral infiltrates: drug reaction? Recurrent malignancy? Continue Solu-Medrol Follow ABG Wean as tolerated Daily wake-up assessment Holding diuresis due to worsening renal failure   Acute pulmonary embolism: subsegmental, in presence of malignancy: complicated by bleeding twice now (drop in Hgb on 2/17, oozing 2/19); given subsegmental emboli, benefit of treatment does not outweigh risks Hold anticoagulation at this time Symptomatic anemia: Recent Labs    07/08/18 1502 07/09/18 0300  HGB 7.5* 7.8*    Monitor transfuse per protocol  Circulatory shock Wean Levophed per protocol  Hyperglycemia: CBG (last 3)  Recent Labs    07/08/18 2344 07/09/18 0320 07/09/18 0834  GLUCAP 171* 152* 165*    Continue sliding scale insulin  HCAP? Off antibiotics  AKI: Creatinine continues to worsen Lab Results  Component Value Date   CREATININE 3.68 (H) 07/09/2018   CREATININE 3.22 (H) 07/08/2018   CREATININE 2.94 (H) 07/07/2018    Holding Lasix Questionable hemodialysis.  Small cell lung cancer this may not be the best option.  Small cell lung cancer  Poor prognosis  Best practice:  Diet: tube feeding Pain/Anxiety/Delirium protocol RA SS score -2  VAP protocol (if indicated): yes DVT prophylaxis: heparin GI prophylaxis: famotidine Glucose control: SSI as above Mobility: bed rest Code Status: full Family Communication: 07/09/2018 no family at bedside Disposition: remain in ICU  Labs   CBC: Recent Labs  Lab 07/05/18 0404  07/06/18 0415  07/07/18 0532 07/07/18 1124 07/08/18 0427 07/08/18 1014  07/08/18 1502 07/09/18 0300  WBC 14.4*  --  10.2  --  10.2  --  9.9  --   --  12.0*  NEUTROABS 10.0*  --  7.8*  --  8.6*  --  7.8*  --   --  9.6*  HGB 7.2*   < > 7.2*   < > 8.4* 6.8* 8.0* 6.8* 7.5* 7.8*  HCT 24.3*   < > 23.8*   < > 27.7* 20.0* 25.8* 20.0* 22.0* 25.2*  MCV 100.4*  --  102.1*  --  99.6  --  98.9  --   --  99.2  PLT 129*  --  95*  --  95*  --  78*  --   --  87*   < > = values in this interval not displayed.    Basic Metabolic Panel: Recent Labs  Lab 07/04/18 0331  07/05/18 0404  07/06/18 0415  07/06/18 2132  07/07/18 0452  07/07/18 1958 07/08/18 0427 07/08/18 1014 07/08/18 1502 07/09/18 0300  NA 146*   < > 146*   < > 143   < > 141   < > 145   < > 145 145 145 145 145  K 3.4*   < > 4.0   < > 4.4   < > 4.8   < > 4.4   < > 4.2 4.4 4.3 4.4 4.8  CL 117*   < > 118*   < > 115*  --  114*  --  112*  --  109 111  --   --  112*  CO2 23   < > 17*   < > 21*  --  22  --  24  --  22 23  --   --  20*  GLUCOSE 124*   < > 188*   < > 178*  --  243*  --  303*  --  250* 167*  --   --  182*  BUN 17   < > 26*   < > 33*  --  45*  --  54*  --  66* 74*  --   --  92*  CREATININE 1.12*   < > 1.60*   < > 1.83*  --  2.45*  --  2.71*  --  2.94* 3.22*  --   --  3.68*  CALCIUM 7.7*   < > 8.1*   < > 7.9*  --  7.7*  --  7.6*  --  7.5* 7.6*  --   --  7.6*  MG 1.6*  --  2.5*  --  2.3  --  2.4  --  2.4  --  2.6* 2.4  --   --   --   PHOS 2.9  --  3.4  --  3.7  --   --   --  3.9  3.9  --   --  3.9  --   --   --    < > = values in this interval not displayed.   GFR: Estimated Creatinine Clearance: 15.2 mL/min (A) (by C-G formula based on SCr of 3.68 mg/dL (H)). Recent Labs  Lab 07/03/18  4270  07/04/18 2325  07/06/18 0415 07/07/18 0532 07/08/18 0427 07/09/18 0300  PROCALCITON 0.53  --   --   --   --   --   --   --   WBC 8.9   < > 14.4*   < > 10.2 10.2 9.9 12.0*  LATICACIDVEN  --   --  1.3  --   --   --   --   --    < > = values in this interval not displayed.    Liver Function Tests: Recent  Labs  Lab 07/03/18 0429 07/05/18 0404 07/06/18 0415 07/07/18 0452  AST _0 --   ALT _1 --   ALKPHOS 131* 129*  138* 122  --   BILITOT 1.8* 0.9  0.9 0.7  --   PROT 5.2* 5.3*  5.2* 5.0*  --   ALBUMIN 1.9* 1.5*  1.5* 1.4* 1.4*   No results for input(s): LIPASE, AMYLASE in the last 168 hours. No results for input(s): AMMONIA in the last 168 hours.  ABG    Component Value Date/Time   PHART 7.334 (L) 07/08/2018 1502   PCO2ART 43.4 07/08/2018 1502   PO2ART 54.0 (L) 07/08/2018 1502   HCO3 23.3 07/08/2018 1502   TCO2 25 07/08/2018 1502   ACIDBASEDEF 3.0 (H) 07/08/2018 1502   O2SAT 86.0 07/08/2018 1502     Coagulation Profile: Recent Labs  Lab 07/05/18 0404  INR 1.47    Cardiac Enzymes: Recent Labs  Lab 07/05/18 0404  TROPONINI 0.06*    HbA1C: Hgb A1c MFr Bld  Date/Time Value Ref Range Status  06/27/2018 06:41 AM 6.5 (H) 4.8 - 5.6 % Final    Comment:    (NOTE) Pre diabetes:          5.7%-6.4% Diabetes:              >6.4% Glycemic control for   <7.0% adults with diabetes     CBG: Recent Labs  Lab 07/08/18 1550 07/08/18 1948 07/08/18 2344 07/09/18 0320 07/09/18 0834  GLUCAP 157* 166* 171* 152* 165*     Critical care time: 35 minutes      Richardson Landry Elishua Radford ACNP [facility] P[facility] Pager [phone] till 1 pm If no answer page [phone] 07/09/2018, 10:15 AM

## 2018-07-09 NOTE — Progress Notes (Signed)
Patients husband requested chaplain to pray ( I was on code next door to the room)  Chaplain came and prayed with the patient and her husband as he requested.  Prayers for Michele [name] for peace in mind and heart and for staff caring for her and for her husband and other family. ABBE BULA, Chaplain   07/09/18 1400  Clinical Encounter Type  Visited With Patient and family together  Visit Type Spiritual support  Referral From Family  Consult/Referral To Chaplain  Spiritual Encounters  Spiritual Needs Prayer  Stress Factors  Family Stress Factors Health changes

## 2018-07-10 ENCOUNTER — Inpatient Hospital Stay (HOSPITAL_COMMUNITY): Payer: Medicare Other

## 2018-07-10 DIAGNOSIS — E785 Hyperlipidemia, unspecified: Secondary | ICD-10-CM

## 2018-07-10 LAB — BASIC METABOLIC PANEL
Anion gap: 8 (ref 5–15)
BUN: 113 mg/dL — AB (ref 8–23)
CO2: 24 mmol/L (ref 22–32)
Calcium: 7.5 mg/dL — ABNORMAL LOW (ref 8.9–10.3)
Chloride: 112 mmol/L — ABNORMAL HIGH (ref 98–111)
Creatinine, Ser: 3.91 mg/dL — ABNORMAL HIGH (ref 0.44–1.00)
GFR calc Af Amer: 13 mL/min — ABNORMAL LOW (ref >60)
GFR calc non Af Amer: 11 mL/min — ABNORMAL LOW (ref >60)
Glucose, Bld: 153 mg/dL — ABNORMAL HIGH (ref 70–99)
Potassium: 4.8 mmol/L (ref 3.5–5.1)
Sodium: 144 mmol/L (ref 135–145)

## 2018-07-10 LAB — GLUCOSE, CAPILLARY
GLUCOSE-CAPILLARY: 127 mg/dL — AB (ref 70–99)
Glucose-Capillary: 140 mg/dL — ABNORMAL HIGH (ref 70–99)
Glucose-Capillary: 138 mg/dL — ABNORMAL HIGH (ref 70–99)
Glucose-Capillary: 149 mg/dL — ABNORMAL HIGH (ref 70–99)
Glucose-Capillary: 133 mg/dL — ABNORMAL HIGH (ref 70–99)
Glucose-Capillary: 116 mg/dL — ABNORMAL HIGH (ref 70–99)

## 2018-07-10 LAB — PHOSPHORUS: Phosphorus: 6.1 mg/dL — ABNORMAL HIGH (ref 2.5–4.6)

## 2018-07-10 LAB — MAGNESIUM: Magnesium: 3 mg/dL — ABNORMAL HIGH (ref 1.7–2.4)

## 2018-07-10 NOTE — Progress Notes (Signed)
NAME:  Michele [name], MRN:  [id number], DOB:  April 30, 1946, LOS: 9 ADMISSION DATE:  07/08/2018, CONSULTATION DATE:  07/03/2018 REFERRING MD:  Thereasa Solo, CHIEF COMPLAINT:  Dyspnea   Brief History   73 y/o female with small cel lung cancer treated with etoposide and carboplatin completed 05/2018 admitted for acute hypoxemic respiratory failure with progressive infiltrates and a PE, required intubation.    Past Medical History  Small cell lung cancer  Significant Hospital Events   2/13 - echo - diast dysfn, EF 65% 2/14 respiratory failure 2/15 -  1005 fio2/peep 5 . Fever 101F WBC 10. Dysnch with vent. On fent gtt. Not on pressors. ECHO 07/02/2018 - ef 65%. Has new AKI creat 1.12. PCT 0.5 and borderline  Consults:  PCCM  Procedures:  Portocath long standing ETT 2/14>>> 2/16 PICC left arm>   Significant Diagnostic Tests:  2/12 CT angiogram chest > small subsegmental PE, diffuse bilateral infiltrates  Micro Data:  Blood 2/14>>> negative Urine 2/14>>> negative Sputum 2/14>>> negative BAL 2/14>>> negative RV  21/12 - neg Urine strep negative   Antimicrobials:  Cefepime 2/14>>> off Vancomycin 2/14>>> 2/14  Interim history/subjective:   No significant change.    Objective   Blood pressure 131/65, pulse 66, temperature (!) 97.4 F (36.3 C), temperature source Oral, resp. rate 13, height 5' 4.02" (1.626 m), weight 93.9 kg, SpO2 94 %.    Vent Mode: PRVC FiO2 (%):  [50 %] 50 % Set Rate:  [24 bmp-25 bmp] 24 bmp Vt Set:  [380 mL] 380 mL PEEP:  [10 cmH20] 10 cmH20 Plateau Pressure:  [26 cmH20-30 cmH20] 27 cmH20   Intake/Output Summary (Last 24 hours) at 07/10/2018 1123 Last data filed at 07/10/2018 1013 Gross per 24 hour  Intake 1102.51 ml  Output 835 ml  Net 267.51 ml   Filed Weights   07/08/18 0417 07/09/18 0148 07/10/18 0341  Weight: 92.5 kg 92.8 kg 93.9 kg    Examination:  General: Frail female who is currently on full mechanical ventilatory support HEENT:  Endotracheal tube in place gastric tube in place Neuro: Currently heavily sedated CV: Heart sounds are distant PULM: even/non-labored, lungs bilaterally creased throughout MA:UQJF, non-tender, bsx4 active  Extremities: warm/dry, 1+ edema  Skin: no rashes or lesions      Resolved Hospital Problem list     Assessment & Plan:  Acute respiratory failure with hypoxemia/ARDS Diffuse bilateral infiltrates: drug reaction? Recurrent malignancy? Currently on Solu-Medrol 60 mg IV every 6 Trend ABG Wean as tolerated Continue to hold diuresis due to worsening renal failure   Acute pulmonary embolism: subsegmental, in presence of malignancy: complicated by bleeding twice now (drop in Hgb on 2/17, oozing 2/19); given subsegmental emboli, benefit of treatment does not outweigh risks Holding anticoagulation at this time   Symptomatic anemia: Recent Labs    07/08/18 1502 07/09/18 0300  HGB 7.5* 7.8*    Transfuse per protocol  Circulatory shock Wean Levophed per protocol  Hyperglycemia: CBG (last 3)  Recent Labs    07/09/18 2341 07/10/18 0338 07/10/18 0712  GLUCAP 150* 140* 138*    Sliding scale insulin  HCAP? Off antibiotics  AKI: Creatinine continues to worsen Lab Results  Component Value Date   CREATININE 3.91 (H) 07/10/2018   CREATININE 3.68 (H) 07/09/2018   CREATININE 3.22 (H) 07/08/2018    Continue to hold Lasix Questionable hemodialysis.  This may not be a viable option in the setting of small cell lung cancer  Small cell lung cancer  Gnosis remains poor  Best practice:  Diet: tube feeding Pain/Anxiety/Delirium protocol RA SS score -2 VAP protocol (if indicated): yes DVT prophylaxis: heparin GI prophylaxis: famotidine Glucose control: SSI as above Mobility: bed rest Code Status: full Family Communication: 07/10/2018 no family at bedside Disposition: remain in ICU  Labs   CBC: Recent Labs  Lab 07/05/18 0404  07/06/18 0415  07/07/18 0532  07/07/18 1124 07/08/18 0427 07/08/18 1014 07/08/18 1502 07/09/18 0300  WBC 14.4*  --  10.2  --  10.2  --  9.9  --   --  12.0*  NEUTROABS 10.0*  --  7.8*  --  8.6*  --  7.8*  --   --  9.6*  HGB 7.2*   < > 7.2*   < > 8.4* 6.8* 8.0* 6.8* 7.5* 7.8*  HCT 24.3*   < > 23.8*   < > 27.7* 20.0* 25.8* 20.0* 22.0* 25.2*  MCV 100.4*  --  102.1*  --  99.6  --  98.9  --   --  99.2  PLT 129*  --  95*  --  95*  --  78*  --   --  87*   < > = values in this interval not displayed.    Basic Metabolic Panel: Recent Labs  Lab 07/05/18 0404  07/06/18 0415  07/06/18 2132  07/07/18 0452  07/07/18 1958 07/08/18 0427 07/08/18 1014 07/08/18 1502 07/09/18 0300 07/10/18 0332  NA 146*   < > 143   < > 141   < > 145   < > 145 145 145 145 145 144  K 4.0   < > 4.4   < > 4.8   < > 4.4   < > 4.2 4.4 4.3 4.4 4.8 4.8  CL 118*   < > 115*  --  114*  --  112*  --  109 111  --   --  112* 112*  CO2 17*   < > 21*  --  22  --  24  --  22 23  --   --  20* 24  GLUCOSE 188*   < > 178*  --  243*  --  303*  --  250* 167*  --   --  182* 153*  BUN 26*   < > 33*  --  45*  --  54*  --  66* 74*  --   --  92* 113*  CREATININE 1.60*   < > 1.83*  --  2.45*  --  2.71*  --  2.94* 3.22*  --   --  3.68* 3.91*  CALCIUM 8.1*   < > 7.9*  --  7.7*  --  7.6*  --  7.5* 7.6*  --   --  7.6* 7.5*  MG 2.5*  --  2.3  --  2.4  --  2.4  --  2.6* 2.4  --   --   --  3.0*  PHOS 3.4  --  3.7  --   --   --  3.9  3.9  --   --  3.9  --   --   --  6.1*   < > = values in this interval not displayed.   GFR: Estimated Creatinine Clearance: 14.5 mL/min (A) (by C-G formula based on SCr of 3.91 mg/dL (H)). Recent Labs  Lab 07/04/18 2325  07/06/18 0415 07/07/18 0532 07/08/18 0427 07/09/18 0300  WBC 14.4*   < > 10.2 10.2 9.9 12.0*  LATICACIDVEN 1.3  --   --   --   --   --    < > =  values in this interval not displayed.    Liver Function Tests: Recent Labs  Lab 07/05/18 0404 07/06/18 0415 07/07/18 0452  AST _0 --   ALT _1 --     ALKPHOS 129*  138* 122  --   BILITOT 0.9  0.9 0.7  --   PROT 5.3*  5.2* 5.0*  --   ALBUMIN 1.5*  1.5* 1.4* 1.4*   No results for input(s): LIPASE, AMYLASE in the last 168 hours. No results for input(s): AMMONIA in the last 168 hours.  ABG    Component Value Date/Time   PHART 7.334 (L) 07/08/2018 1502   PCO2ART 43.4 07/08/2018 1502   PO2ART 54.0 (L) 07/08/2018 1502   HCO3 23.3 07/08/2018 1502   TCO2 25 07/08/2018 1502   ACIDBASEDEF 3.0 (H) 07/08/2018 1502   O2SAT 86.0 07/08/2018 1502     Coagulation Profile: Recent Labs  Lab 07/05/18 0404  INR 1.47    Cardiac Enzymes: Recent Labs  Lab 07/05/18 0404  TROPONINI 0.06*    HbA1C: Hgb A1c MFr Bld  Date/Time Value Ref Range Status  06/27/2018 06:41 AM 6.5 (H) 4.8 - 5.6 % Final    Comment:    (NOTE) Pre diabetes:          5.7%-6.4% Diabetes:              >6.4% Glycemic control for   <7.0% adults with diabetes     CBG: Recent Labs  Lab 07/09/18 1653 07/09/18 1917 07/09/18 2341 07/10/18 0338 07/10/18 0712  GLUCAP 170* 161* 150* 140* 138*     Critical care time: 35 minutes      Richardson Landry Latara Micheli ACNP [facility] Pager [phone] till 1 pm If no answer page [phone] 07/10/2018, 11:23 AM

## 2018-07-11 ENCOUNTER — Inpatient Hospital Stay (HOSPITAL_COMMUNITY): Payer: Medicare Other

## 2018-07-11 LAB — GLUCOSE, CAPILLARY
GLUCOSE-CAPILLARY: 129 mg/dL — AB (ref 70–99)
Glucose-Capillary: 123 mg/dL — ABNORMAL HIGH (ref 70–99)
Glucose-Capillary: 125 mg/dL — ABNORMAL HIGH (ref 70–99)
Glucose-Capillary: 117 mg/dL — ABNORMAL HIGH (ref 70–99)
Glucose-Capillary: 133 mg/dL — ABNORMAL HIGH (ref 70–99)
Glucose-Capillary: 135 mg/dL — ABNORMAL HIGH (ref 70–99)

## 2018-07-11 LAB — PHOSPHORUS: Phosphorus: 6.7 mg/dL — ABNORMAL HIGH (ref 2.5–4.6)

## 2018-07-11 LAB — MAGNESIUM: Magnesium: 3 mg/dL — ABNORMAL HIGH (ref 1.7–2.4)

## 2018-07-11 MED ORDER — METHYLPREDNISOLONE SODIUM SUCC 125 MG IJ SOLR
60.0000 mg | Freq: Two times a day (BID) | INTRAMUSCULAR | Status: DC
  Administered 2018-07-12 – 2018-07-16 (×9): 60 mg via INTRAVENOUS
  Filled 2018-07-11 (×9): qty 2

## 2018-07-11 MED ORDER — ASPIRIN 81 MG PO CHEW
81.0000 mg | CHEWABLE_TABLET | Freq: Every day | ORAL | Status: DC
  Administered 2018-07-11 – 2018-07-15 (×5): 81 mg
  Filled 2018-07-11 (×5): qty 1

## 2018-07-11 MED ORDER — LABETALOL HCL 5 MG/ML IV SOLN
10.0000 mg | INTRAVENOUS | Status: AC | PRN
  Administered 2018-07-11 – 2018-07-14 (×10): 10 mg via INTRAVENOUS
  Filled 2018-07-11 (×11): qty 4

## 2018-07-11 MED ORDER — METHYLPREDNISOLONE SODIUM SUCC 125 MG IJ SOLR
INTRAMUSCULAR | Status: AC
  Filled 2018-07-11: qty 2

## 2018-07-11 NOTE — Progress Notes (Signed)
NAME:  Michele [name], MRN:  [id number], DOB:  Mar 30, 1946, LOS: 33 ADMISSION DATE:  06/21/2018, CONSULTATION DATE:  07/03/2018 REFERRING MD:  Thereasa Solo, CHIEF COMPLAINT:  Dyspnea   Brief History   73 y/o female with small cel lung cancer treated with etoposide and carboplatin completed 05/2018 admitted for acute hypoxemic respiratory failure with progressive infiltrates and a PE, required intubation.    Past Medical History  Small cell lung cancer  Significant Hospital Events   2/13 - echo - diast dysfn, EF 65% 2/14 respiratory failure 2/15 -  1005 fio2/peep 5 . Fever 101F WBC 10. Dysnch with vent. On fent gtt. Not on pressors. ECHO 07/02/2018 - ef 65%. Has new AKI creat 1.12. PCT 0.5 and borderline  Consults:  PCCM  Procedures:  Portocath long standing ETT 2/14>>> 2/16 PICC left arm>   Significant Diagnostic Tests:  2/12 CT angiogram chest > small subsegmental PE, diffuse bilateral infiltrates  Micro Data:  Blood 2/14>>> negative Urine 2/14>>> negative Sputum 2/14>>> negative BAL 2/14>>> negative RV  21/12 - neg Urine strep negative   Antimicrobials:  Cefepime 2/14>>> off Vancomycin 2/14>>> 2/14  Interim history/subjective:   No change    Objective   Blood pressure (!) 141/67, pulse 73, temperature 97.8 F (36.6 C), temperature source Axillary, resp. rate (!) 24, height 5' 4.02" (1.626 m), weight 93 kg, SpO2 98 %.    Vent Mode: PRVC FiO2 (%):  [40 %-50 %] 40 % Set Rate:  [24 bmp] 24 bmp Vt Set:  [380 mL] 380 mL PEEP:  [8 cmH20-10 cmH20] 8 cmH20 Plateau Pressure:  [18 cmH20-27 cmH20] 20 cmH20   Intake/Output Summary (Last 24 hours) at 07/11/2018 1511 Last data filed at 07/11/2018 1200 Gross per 24 hour  Intake 263.98 ml  Output 1600 ml  Net -1336.02 ml   Filed Weights   07/09/18 0148 07/10/18 0341 07/11/18 0402  Weight: 92.8 kg 93.9 kg 93 kg    Examination:  General:  Frail female, In bed on vent HENT: NCAT ETT in place PULM: CTA B, vent supported  breathing CV: RRR, no mgr GI: BS+, soft, nontender MSK: normal bulk and tone Derm: edema in arms, legs, face bilaterally Neuro: sedated on vent    Resolved Hospital Problem list   HCAP  Assessment & Plan:  Acute respiratory failure with hypoxemia/ARDS Diffuse bilateral infiltrates: drug reaction? Recurrent malignancy? Continue solumedrol 78m IV q12h Repeat CT chest on 2/24 Continue full vent support Wean FiO2/PEEP for PaO2 55-65 Hold diuresis with worsening renal faiulre  Acute pulmonary embolism: subsegmental, in presence of malignancy: complicated by bleeding twice now (drop in Hgb on 2/17, oozing 2/19); given subsegmental emboli, benefit of treatment does not outweigh risks Hold anticoagulation with bleeding   Symptomatic anemia: Recent Labs    07/09/18 0300  HGB 7.8*    Transfuse if Hgb < 7gm/dL  Circulatory shock presumably due to sedation at this point Wean Levophed per protocol Wean sedation  Hyperglycemia: CBG (last 3)  Recent Labs    07/11/18 0734 07/11/18 0915 07/11/18 1158  GLUCAP 123* 125* 117*  SSI    AKI: Creatinine continues to worsen Lab Results  Component Value Date   CREATININE 3.91 (H) 07/10/2018   CREATININE 3.68 (H) 07/09/2018   CREATININE 3.22 (H) 07/08/2018   Hold lasix Not a dialysis candidate  Small cell lung cancer  Overall prognosis is poor, I have re-iterated this to her husband repeatedly this week/  Best practice:  Diet: tube feeding Pain/Anxiety/Delirium protocol RASS score -  2 VAP protocol (if indicated): yes DVT prophylaxis: heparin GI prophylaxis: famotidine Glucose control: SSI as above Mobility: bed rest Code Status: full Family Communication: husband updated daily this week Disposition: remain in ICU  Labs   CBC: Recent Labs  Lab 07/05/18 0404  07/06/18 0415  07/07/18 0532 07/07/18 1124 07/08/18 0427 07/08/18 1014 07/08/18 1502 07/09/18 0300  WBC 14.4*  --  10.2  --  10.2  --  9.9  --   --   12.0*  NEUTROABS 10.0*  --  7.8*  --  8.6*  --  7.8*  --   --  9.6*  HGB 7.2*   < > 7.2*   < > 8.4* 6.8* 8.0* 6.8* 7.5* 7.8*  HCT 24.3*   < > 23.8*   < > 27.7* 20.0* 25.8* 20.0* 22.0* 25.2*  MCV 100.4*  --  102.1*  --  99.6  --  98.9  --   --  99.2  PLT 129*  --  95*  --  95*  --  78*  --   --  87*   < > = values in this interval not displayed.    Basic Metabolic Panel: Recent Labs  Lab 07/06/18 0415  07/07/18 0452  07/07/18 1958 07/08/18 0427 07/08/18 1014 07/08/18 1502 07/09/18 0300 07/10/18 0332 07/11/18 0359  NA 143   < > 145   < > 145 145 145 145 145 144  --   K 4.4   < > 4.4   < > 4.2 4.4 4.3 4.4 4.8 4.8  --   CL 115*   < > 112*  --  109 111  --   --  112* 112*  --   CO2 21*   < > 24  --  22 23  --   --  20* 24  --   GLUCOSE 178*   < > 303*  --  250* 167*  --   --  182* 153*  --   BUN 33*   < > 54*  --  66* 74*  --   --  92* 113*  --   CREATININE 1.83*   < > 2.71*  --  2.94* 3.22*  --   --  3.68* 3.91*  --   CALCIUM 7.9*   < > 7.6*  --  7.5* 7.6*  --   --  7.6* 7.5*  --   MG 2.3   < > 2.4  --  2.6* 2.4  --   --   --  3.0* 3.0*  PHOS 3.7  --  3.9  3.9  --   --  3.9  --   --   --  6.1* 6.7*   < > = values in this interval not displayed.   GFR: Estimated Creatinine Clearance: 14.4 mL/min (A) (by C-G formula based on SCr of 3.91 mg/dL (H)). Recent Labs  Lab 07/04/18 2325  07/06/18 0415 07/07/18 0532 07/08/18 0427 07/09/18 0300  WBC 14.4*   < > 10.2 10.2 9.9 12.0*  LATICACIDVEN 1.3  --   --   --   --   --    < > = values in this interval not displayed.    Liver Function Tests: Recent Labs  Lab 07/05/18 0404 07/06/18 0415 07/07/18 0452  AST _0 --   ALT _1 --   ALKPHOS 129*  138* 122  --   BILITOT 0.9  0.9 0.7  --  PROT 5.3*  5.2* 5.0*  --   ALBUMIN 1.5*  1.5* 1.4* 1.4*   No results for input(s): LIPASE, AMYLASE in the last 168 hours. No results for input(s): AMMONIA in the last 168 hours.  ABG    Component Value Date/Time   PHART  7.334 (L) 07/08/2018 1502   PCO2ART 43.4 07/08/2018 1502   PO2ART 54.0 (L) 07/08/2018 1502   HCO3 23.3 07/08/2018 1502   TCO2 25 07/08/2018 1502   ACIDBASEDEF 3.0 (H) 07/08/2018 1502   O2SAT 86.0 07/08/2018 1502     Coagulation Profile: Recent Labs  Lab 07/05/18 0404  INR 1.47    Cardiac Enzymes: Recent Labs  Lab 07/05/18 0404  TROPONINI 0.06*    HbA1C: Hgb A1c MFr Bld  Date/Time Value Ref Range Status  06/27/2018 06:41 AM 6.5 (H) 4.8 - 5.6 % Final    Comment:    (NOTE) Pre diabetes:          5.7%-6.4% Diabetes:              >6.4% Glycemic control for   <7.0% adults with diabetes     CBG: Recent Labs  Lab 07/10/18 2306 07/11/18 0354 07/11/18 0734 07/11/18 0915 07/11/18 1158  GLUCAP 127* 129* 123* 125* 117*     Critical care time: 35 minutes     Roselie Awkward, MD [facility] Pager: [phone] Cell: [phone] If no response, call [phone]  07/11/2018, 3:11 PM

## 2018-07-12 ENCOUNTER — Inpatient Hospital Stay (HOSPITAL_COMMUNITY): Payer: Medicare Other

## 2018-07-12 LAB — GLUCOSE, CAPILLARY
Glucose-Capillary: 120 mg/dL — ABNORMAL HIGH (ref 70–99)
Glucose-Capillary: 122 mg/dL — ABNORMAL HIGH (ref 70–99)
Glucose-Capillary: 129 mg/dL — ABNORMAL HIGH (ref 70–99)
Glucose-Capillary: 132 mg/dL — ABNORMAL HIGH (ref 70–99)
Glucose-Capillary: 136 mg/dL — ABNORMAL HIGH (ref 70–99)
Glucose-Capillary: 148 mg/dL — ABNORMAL HIGH (ref 70–99)
Glucose-Capillary: 164 mg/dL — ABNORMAL HIGH (ref 70–99)

## 2018-07-12 LAB — CBC WITH DIFFERENTIAL/PLATELET
Abs Immature Granulocytes: 1.1 10*3/uL — ABNORMAL HIGH (ref 0.00–0.07)
Basophils Absolute: 0 10*3/uL (ref 0.0–0.1)
Basophils Relative: 0 %
Eosinophils Absolute: 0 10*3/uL (ref 0.0–0.5)
Eosinophils Relative: 0 %
HEMATOCRIT: 23 % — AB (ref 36.0–46.0)
Hemoglobin: 6.9 g/dL — CL (ref 12.0–15.0)
Immature Granulocytes: 12 %
Lymphocytes Relative: 4 %
Lymphs Abs: 0.4 10*3/uL — ABNORMAL LOW (ref 0.7–4.0)
MCH: 30 pg (ref 26.0–34.0)
MCHC: 30 g/dL (ref 30.0–36.0)
MCV: 100 fL (ref 80.0–100.0)
Monocytes Absolute: 0.6 10*3/uL (ref 0.1–1.0)
Monocytes Relative: 7 %
Neutro Abs: 7.1 10*3/uL (ref 1.7–7.7)
Neutrophils Relative %: 77 %
Platelets: 66 10*3/uL — ABNORMAL LOW (ref 150–400)
RBC: 2.3 MIL/uL — ABNORMAL LOW (ref 3.87–5.11)
RDW: 16.6 % — ABNORMAL HIGH (ref 11.5–15.5)
WBC: 9.2 10*3/uL (ref 4.0–10.5)
nRBC: 1.1 % — ABNORMAL HIGH (ref 0.0–0.2)

## 2018-07-12 LAB — BASIC METABOLIC PANEL
Anion gap: 9 (ref 5–15)
BUN: 141 mg/dL — ABNORMAL HIGH (ref 8–23)
CO2: 23 mmol/L (ref 22–32)
Calcium: 7.5 mg/dL — ABNORMAL LOW (ref 8.9–10.3)
Chloride: 116 mmol/L — ABNORMAL HIGH (ref 98–111)
Creatinine, Ser: 3.94 mg/dL — ABNORMAL HIGH (ref 0.44–1.00)
GFR calc non Af Amer: 11 mL/min — ABNORMAL LOW (ref >60)
GFR, EST AFRICAN AMERICAN: 12 mL/min — AB (ref >60)
Glucose, Bld: 155 mg/dL — ABNORMAL HIGH (ref 70–99)
Potassium: 4.8 mmol/L (ref 3.5–5.1)
SODIUM: 148 mmol/L — AB (ref 135–145)

## 2018-07-12 LAB — HEMOGLOBIN AND HEMATOCRIT, BLOOD
HCT: 29.6 % — ABNORMAL LOW (ref 36.0–46.0)
Hemoglobin: 9.2 g/dL — ABNORMAL LOW (ref 12.0–15.0)

## 2018-07-12 LAB — PREPARE RBC (CROSSMATCH)

## 2018-07-12 MED ORDER — HYDRALAZINE HCL 25 MG PO TABS
25.0000 mg | ORAL_TABLET | Freq: Three times a day (TID) | ORAL | Status: DC | PRN
  Administered 2018-07-12 (×2): 25 mg
  Filled 2018-07-12 (×2): qty 1

## 2018-07-12 MED ORDER — MIDAZOLAM HCL 2 MG/2ML IJ SOLN
1.0000 mg | INTRAMUSCULAR | Status: DC | PRN
  Administered 2018-07-13 – 2018-07-14 (×3): 1 mg via INTRAVENOUS
  Filled 2018-07-12 (×4): qty 2

## 2018-07-12 MED ORDER — SODIUM CHLORIDE 0.9% IV SOLUTION
Freq: Once | INTRAVENOUS | Status: AC
  Administered 2018-07-12: 06:00:00 via INTRAVENOUS

## 2018-07-12 NOTE — Progress Notes (Signed)
CRITICAL VALUE ALERT  Critical Value: HBG 6.9   Date & Time Notied: 07/12/18 04:25  Provider Notified: Warren Lacy RN   Orders Received/Actions taken: RN will notify MD.

## 2018-07-12 NOTE — Progress Notes (Signed)
eLink Physician-Brief Progress Note Patient Name: JERICCA RUSSETT DOB: [birth year] MRN: [id number]   Date of Service  07/12/2018  HPI/Events of Note  Hgb drift to 6.9  eICU Interventions  Plan: Transfuse 1 unit pRBC Post-transfusion CBC, BMET     Intervention Category Intermediate Interventions: Other:  DETERDING,ELIZABETH 07/12/2018, 4:26 AM

## 2018-07-12 NOTE — Plan of Care (Signed)
  Problem: Clinical Measurements: Goal: Ability to maintain clinical measurements within normal limits will improve Outcome: Progressing Goal: Will remain free from infection Outcome: Progressing Goal: Diagnostic test results will improve Outcome: Progressing Goal: Respiratory complications will improve Outcome: Progressing Goal: Cardiovascular complication will be avoided Outcome: Progressing   Problem: Nutrition: Goal: Adequate nutrition will be maintained Outcome: Progressing   Problem: Coping: Goal: Level of anxiety will decrease Outcome: Progressing   Problem: Elimination: Goal: Will not experience complications related to bowel motility Outcome: Progressing Goal: Will not experience complications related to urinary retention Outcome: Progressing   Problem: Pain Managment: Goal: General experience of comfort will improve Outcome: Progressing   Problem: Education: Goal: Knowledge of General Education information will improve Description Including pain rating scale, medication(s)/side effects and non-pharmacologic comfort measures Outcome: Not Progressing   Problem: Activity: Goal: Risk for activity intolerance will decrease Outcome: Not Progressing

## 2018-07-12 NOTE — Progress Notes (Signed)
Daughter relayed Dr. Anastasia Pall updates to family this morning. Husband, Son, and Daughter returned to the bedside this afternoon and verbalized that they understand her condition is guarded and will need extensive care if she pulls through this acute illness. They verbalized that it is not her wish to be kept alive by machines and that she would not want to be maintained on a trach long term if she would not have any quality of life. I relayed this information to Dr. Lake Bells.

## 2018-07-12 NOTE — Progress Notes (Signed)
NAME:  Michele [name], MRN:  [id number], DOB:  01/31/1946, LOS: 56 ADMISSION DATE:  06/29/2018, CONSULTATION DATE:  07/03/2018 REFERRING MD:  Thereasa Solo, CHIEF COMPLAINT:  Dyspnea   Brief History   73 y/o female with small cel lung cancer treated with etoposide and carboplatin completed 05/2018 admitted for acute hypoxemic respiratory failure with progressive infiltrates and a PE, required intubation.    Past Medical History  Small cell lung cancer  Significant Hospital Events   2/13 - echo - diast dysfn, EF 65% 2/14 respiratory failure 2/15 -  1005 fio2/peep 5 . Fever 101F WBC 10. Dysnch with vent. On fent gtt. Not on pressors. ECHO 07/02/2018 - ef 65%. Has new AKI creat 1.12. PCT 0.5 and borderline 2/17 started empiric steroids 2/17-2/23 improved oxygenation, worsening renal failure  Consults:  PCCM  Procedures:  Portocath long standing ETT 2/14>>> 2/16 PICC left arm>   Significant Diagnostic Tests:  2/12 CT angiogram chest > small subsegmental PE, diffuse bilateral infiltrates  Micro Data:  Blood 2/14>>> negative Urine 2/14>>> negative Sputum 2/14>>> negative BAL 2/14>>> negative RV  21/12 - neg Urine strep negative   Antimicrobials:  Cefepime 2/14>>> off Vancomycin 2/14>>> 2/14  Interim history/subjective:   Oxygenation better Urine output better   Objective   Blood pressure (!) 163/76, pulse 70, temperature 97.6 F (36.4 C), temperature source Oral, resp. rate 20, height 5' 4.02" (1.626 m), weight 93.5 kg, SpO2 93 %.    Vent Mode: PRVC FiO2 (%):  [40 %] 40 % Set Rate:  [24 bmp] 24 bmp Vt Set:  [380 mL] 380 mL PEEP:  [8 cmH20] 8 cmH20 Plateau Pressure:  [15 cmH20-21 cmH20] 21 cmH20   Intake/Output Summary (Last 24 hours) at 07/12/2018 1015 Last data filed at 07/12/2018 0800 Gross per 24 hour  Intake 418.03 ml  Output 1395 ml  Net -976.97 ml   Filed Weights   07/10/18 0341 07/11/18 0402 07/12/18 0500  Weight: 93.9 kg 93 kg 93.5 kg     Examination:  General:  Chronically ill appearing, in bed on vent HENT: NCAT ETT in place PULM: Crackles bilaterally B, vent supported breathing CV: RRR, no mgr GI: BS+, soft, nontender MSK: normal bulk and tone Derm: edema noted Neuro: sedated on vent    Resolved Hospital Problem list   HCAP  Assessment & Plan:  Acute respiratory failure with hypoxemia/ARDS: slow improvement Diffuse bilateral infiltrates: drug reaction? Recurrent malignancy? Continue solumedrol Repeat CT chest on 2/24 Full mechanical vent support, wean PEEP/FiO2 for paO2 55-65 VAP prevention Daily WUA/SBT   Acute pulmonary embolism: subsegmental, in presence of malignancy: complicated by bleeding twice now (drop in Hgb on 2/17, oozing 2/19); given subsegmental emboli, benefit of treatment does not outweigh risks Continue to hold anticoagulation  Symptomatic anemia: no sign of active bleeding Transfuse if Hgb < 7gm/dL  Circulatory shock presumably due to sedation at this point Wean off sedation and levophed  Hyperglycemia: SSI  AKI: Creatinine stable, good UOP 2/22>2/23 Hold lasix Not a candidate for dialysis Monitor BMET and UOP Replace electrolytes as needed  Small cell lung cancer  Some improvement in renal function in last 24 hours, some improvement in oxygenation in last 5-7 days since starting steroids.  I think her infiltrates are likely explained by a drug reaction and will take weeks to recover fully.  At this point it looks like she would need prolonged mechanical ventilation which would be very difficult for her considering the severity of her multiple chronic illnesses.  I  have repeatedly asked the family to consider what she would want in this situation.  Her daughter tells me that she has stated in the past that she does not want to be placed in a nursing home.   Best practice:  Diet: tube feeding Pain/Anxiety/Delirium protocol RASS score -2 VAP protocol (if indicated): yes DVT  prophylaxis: heparin GI prophylaxis: famotidine Glucose control: SSI as above Mobility: bed rest Code Status: full Family Communication: husband updated daily this week Disposition: remain in ICU  Labs   CBC: Recent Labs  Lab 07/06/18 0415  07/07/18 0532  07/08/18 0427 07/08/18 1014 07/08/18 1502 07/09/18 0300 07/12/18 0257  WBC 10.2  --  10.2  --  9.9  --   --  12.0* 9.2  NEUTROABS 7.8*  --  8.6*  --  7.8*  --   --  9.6* 7.1  HGB 7.2*   < > 8.4*   < > 8.0* 6.8* 7.5* 7.8* 6.9*  HCT 23.8*   < > 27.7*   < > 25.8* 20.0* 22.0* 25.2* 23.0*  MCV 102.1*  --  99.6  --  98.9  --   --  99.2 100.0  PLT 95*  --  95*  --  78*  --   --  87* 66*   < > = values in this interval not displayed.    Basic Metabolic Panel: Recent Labs  Lab 07/06/18 0415  07/07/18 0452  07/07/18 1958 07/08/18 0427 07/08/18 1014 07/08/18 1502 07/09/18 0300 07/10/18 0332 07/11/18 0359 07/12/18 0257  NA 143   < > 145   < > 145 145 145 145 145 144  --  148*  K 4.4   < > 4.4   < > 4.2 4.4 4.3 4.4 4.8 4.8  --  4.8  CL 115*   < > 112*  --  109 111  --   --  112* 112*  --  116*  CO2 21*   < > 24  --  22 23  --   --  20* 24  --  23  GLUCOSE 178*   < > 303*  --  250* 167*  --   --  182* 153*  --  155*  BUN 33*   < > 54*  --  66* 74*  --   --  92* 113*  --  141*  CREATININE 1.83*   < > 2.71*  --  2.94* 3.22*  --   --  3.68* 3.91*  --  3.94*  CALCIUM 7.9*   < > 7.6*  --  7.5* 7.6*  --   --  7.6* 7.5*  --  7.5*  MG 2.3   < > 2.4  --  2.6* 2.4  --   --   --  3.0* 3.0*  --   PHOS 3.7  --  3.9  3.9  --   --  3.9  --   --   --  6.1* 6.7*  --    < > = values in this interval not displayed.   GFR: Estimated Creatinine Clearance: 14.3 mL/min (A) (by C-G formula based on SCr of 3.94 mg/dL (H)). Recent Labs  Lab 07/07/18 0532 07/08/18 0427 07/09/18 0300 07/12/18 0257  WBC 10.2 9.9 12.0* 9.2    Liver Function Tests: Recent Labs  Lab 07/06/18 0415 07/07/18 0452  AST 17  --   ALT 10  --   ALKPHOS 122  --    BILITOT 0.7  --  PROT 5.0*  --   ALBUMIN 1.4* 1.4*   No results for input(s): LIPASE, AMYLASE in the last 168 hours. No results for input(s): AMMONIA in the last 168 hours.  ABG    Component Value Date/Time   PHART 7.334 (L) 07/08/2018 1502   PCO2ART 43.4 07/08/2018 1502   PO2ART 54.0 (L) 07/08/2018 1502   HCO3 23.3 07/08/2018 1502   TCO2 25 07/08/2018 1502   ACIDBASEDEF 3.0 (H) 07/08/2018 1502   O2SAT 86.0 07/08/2018 1502     Coagulation Profile: No results for input(s): INR, PROTIME in the last 168 hours.  Cardiac Enzymes: No results for input(s): CKTOTAL, CKMB, CKMBINDEX, TROPONINI in the last 168 hours.  HbA1C: Hgb A1c MFr Bld  Date/Time Value Ref Range Status  06/27/2018 06:41 AM 6.5 (H) 4.8 - 5.6 % Final    Comment:    (NOTE) Pre diabetes:          5.7%-6.4% Diabetes:              >6.4% Glycemic control for   <7.0% adults with diabetes     CBG: Recent Labs  Lab 07/11/18 1612 07/11/18 2148 07/12/18 0021 07/12/18 0405 07/12/18 0745  GLUCAP 133* 135* 148* 122* 129*     Critical care time: 34 minutes     Roselie Awkward, MD [facility] Pager: [phone] Cell: [phone] If no response, call [phone]  07/12/2018, 10:15 AM

## 2018-07-13 ENCOUNTER — Inpatient Hospital Stay (HOSPITAL_COMMUNITY): Payer: Medicare Other

## 2018-07-13 DIAGNOSIS — Z9911 Dependence on respirator [ventilator] status: Secondary | ICD-10-CM

## 2018-07-13 DIAGNOSIS — C801 Malignant (primary) neoplasm, unspecified: Secondary | ICD-10-CM

## 2018-07-13 LAB — BASIC METABOLIC PANEL
Anion gap: 9 (ref 5–15)
BUN: 146 mg/dL — ABNORMAL HIGH (ref 8–23)
CO2: 23 mmol/L (ref 22–32)
Calcium: 7.7 mg/dL — ABNORMAL LOW (ref 8.9–10.3)
Chloride: 120 mmol/L — ABNORMAL HIGH (ref 98–111)
Creatinine, Ser: 3.51 mg/dL — ABNORMAL HIGH (ref 0.44–1.00)
GFR calc non Af Amer: 12 mL/min — ABNORMAL LOW (ref >60)
GFR, EST AFRICAN AMERICAN: 14 mL/min — AB (ref >60)
Glucose, Bld: 144 mg/dL — ABNORMAL HIGH (ref 70–99)
Potassium: 5.1 mmol/L (ref 3.5–5.1)
Sodium: 152 mmol/L — ABNORMAL HIGH (ref 135–145)

## 2018-07-13 LAB — CBC WITH DIFFERENTIAL/PLATELET
Abs Immature Granulocytes: 1.14 10*3/uL — ABNORMAL HIGH (ref 0.00–0.07)
Basophils Absolute: 0.1 10*3/uL (ref 0.0–0.1)
Basophils Relative: 1 %
Eosinophils Absolute: 0 10*3/uL (ref 0.0–0.5)
Eosinophils Relative: 0 %
HCT: 30 % — ABNORMAL LOW (ref 36.0–46.0)
Hemoglobin: 9.4 g/dL — ABNORMAL LOW (ref 12.0–15.0)
Immature Granulocytes: 9 %
LYMPHS PCT: 2 %
Lymphs Abs: 0.2 10*3/uL — ABNORMAL LOW (ref 0.7–4.0)
MCH: 31.2 pg (ref 26.0–34.0)
MCHC: 31.3 g/dL (ref 30.0–36.0)
MCV: 99.7 fL (ref 80.0–100.0)
Monocytes Absolute: 0.8 10*3/uL (ref 0.1–1.0)
Monocytes Relative: 6 %
Neutro Abs: 11.1 10*3/uL — ABNORMAL HIGH (ref 1.7–7.7)
Neutrophils Relative %: 82 %
Platelets: 67 10*3/uL — ABNORMAL LOW (ref 150–400)
RBC: 3.01 MIL/uL — ABNORMAL LOW (ref 3.87–5.11)
RDW: 19 % — ABNORMAL HIGH (ref 11.5–15.5)
WBC: 13.3 10*3/uL — AB (ref 4.0–10.5)
nRBC: 1.1 % — ABNORMAL HIGH (ref 0.0–0.2)

## 2018-07-13 LAB — BPAM RBC
Blood Product Expiration Date: 202003182359
ISSUE DATE / TIME: 202002231004
Unit Type and Rh: 6200

## 2018-07-13 LAB — TYPE AND SCREEN
ABO/RH(D): A POS
ANTIBODY SCREEN: NEGATIVE
Unit division: 0

## 2018-07-13 LAB — GLUCOSE, CAPILLARY
Glucose-Capillary: 125 mg/dL — ABNORMAL HIGH (ref 70–99)
Glucose-Capillary: 123 mg/dL — ABNORMAL HIGH (ref 70–99)
Glucose-Capillary: 122 mg/dL — ABNORMAL HIGH (ref 70–99)
Glucose-Capillary: 127 mg/dL — ABNORMAL HIGH (ref 70–99)
Glucose-Capillary: 122 mg/dL — ABNORMAL HIGH (ref 70–99)
Glucose-Capillary: 115 mg/dL — ABNORMAL HIGH (ref 70–99)

## 2018-07-13 MED ORDER — HYDRALAZINE HCL 20 MG/ML IJ SOLN
10.0000 mg | INTRAMUSCULAR | Status: DC | PRN
  Administered 2018-07-13 – 2018-07-14 (×3): 10 mg via INTRAVENOUS
  Filled 2018-07-13 (×3): qty 1

## 2018-07-13 NOTE — Progress Notes (Signed)
eLink Physician-Brief Progress Note Patient Name: Michele [name] DOB: [birth year] MRN: [id number]   Date of Service  07/13/2018  HPI/Events of Note  Hypertension - BP = 182/62.  eICU Interventions  Will order: 1. D/C Hydralazine PO.  2. Hydralazine 10 mg IV Q 4 hours PRN SBP > 170 or DBP > 100.     Intervention Category Major Interventions: Hypertension - evaluation and management  Sommer,Steven Eugene 07/13/2018, 3:31 AM

## 2018-07-13 NOTE — Progress Notes (Signed)
NAME:  Michele [name], MRN:  [id number], DOB:  [birth year], LOS: 12 ADMISSION DATE:  06/22/2018, CONSULTATION DATE:  07/03/2018 REFERRING MD:  Thereasa Solo, CHIEF COMPLAINT:  Dyspnea   Brief History   73 y/o female with small cel lung cancer treated with etoposide and carboplatin completed 05/2018 admitted for acute hypoxemic respiratory failure with progressive infiltrates and a PE, required intubation.    Past Medical History  Small cell lung cancer  Significant Hospital Events   2/13 - echo - diast dysfn, EF 65% 2/14 respiratory failure 2/15 -  1005 fio2/peep 5 . Fever 101F WBC 10. Dysnch with vent. On fent gtt. Not on pressors. ECHO 07/02/2018 - ef 65%. Has new AKI creat 1.12. PCT 0.5 and borderline 2/17 started empiric steroids 2/17-2/23 improved oxygenation, worsening renal failure  Consults:  PCCM  Procedures:  Portocath long standing ETT 2/14>>> 2/16 PICC left arm>   Significant Diagnostic Tests:  2/12 CT angiogram chest > small subsegmental PE, diffuse bilateral infiltrates  Micro Data:  Blood 2/14>>> negative Urine 2/14>>> negative Sputum 2/14>>> negative BAL 2/14>>> negative RV  21/12 - neg Urine strep negative  Antimicrobials:  Cefepime 2/14>>> off Vancomycin 2/14>>> 2/14  Interim history/subjective:  Intubated on mechanical ventilation on sedation.  Family at bedside.  Long discussion with husband at bedside regarding goals of care.  Patient's family at this point is in agreements for transition of comfort care measures.  However they would like to speak with the daughter and son before moving forward.  Objective   Blood pressure 132/61, pulse 82, temperature 98.6 F (37 C), temperature source Oral, resp. rate (!) 21, height 5' 4.02" (1.626 m), weight 91.1 kg, SpO2 100 %.    Vent Mode: PRVC FiO2 (%):  [50 %] 50 % Set Rate:  [24 bmp] 24 bmp Vt Set:  [380 mL] 380 mL PEEP:  [8 cmH20] 8 cmH20 Plateau Pressure:  [20 cmH20-26 cmH20] 25 cmH20   Intake/Output  Summary (Last 24 hours) at 07/13/2018 1458 Last data filed at 07/13/2018 1100 Gross per 24 hour  Intake 1220.72 ml  Output 1690 ml  Net -469.28 ml   Filed Weights   07/11/18 0402 07/12/18 0500 07/13/18 0500  Weight: 93 kg 93.5 kg 91.1 kg    Examination:  General: Chronically ill-appearing, elderly female, intubated on mechanical ventilation HENT: NCAT sclera clear, pupils reactive PULM: Bilateral crackles, diminished in the bases, on mechanical ventilation CV: Rate and rhythm, S1-S2, no MRG GI: Bowel sounds present, soft, nontender MSK: Significant edema bilaterally Derm: Dependent third spaced edema, areas of ecchymosis Neuro: Dated on mechanical ventilator, unresponsive  Resolved Hospital Problem list   HCAP  Assessment & Plan:   Cute hypoxemic respiratory failure requiring intubation mechanical ventilation, significant groundglass opacities on CT imaging consistent with ARDS, bilateral infiltrates, possible drug reaction as well as vascular congestion and increased volume with bilateral pleural effusion and consolidation.   At this point we will continue Solu-Medrol Repeat CT scan completed today. Continue wean mechanical ventilation as tolerated to maintain PaO2 between 55 and 65 VAP prevention Daily SBT and awakening trial if tolerated.  Acute pulmonary embolism, subsegmental in the presence of recent malignancy status post chemotherapy holding anticoagulation at this time Continue to hold anticoagulation.  Symptomatic anemia: no sign of active bleeding Cervidil transfusion protocol with hemoglobin less than 7.  Circulatory shock presumably due to sedation at this point Off vasopressors at this time will continue to observe  Hyperglycemia: Cystitis needed  AKI: Creatinine stable, good UOP 2/22>2/23  Holding Lasix at this time. Significant positive human fluid balance. Elevated serum creatinine coming down a little bit in comparison to before.  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  Goals of care discussion: Long discussion with the husband at bedside today.  I have had the privilege of taking care of the patient as there are outpatient pulmonologist since August of this past year.  We had a long discussion today at bedside regarding her goals of care and how she would like to see her care carried out over the next short period of time as well as reviewing the patient's CT images with the husband at bedside.  At this point we have made the collective decision that she would not want to pursue ongoing mechanical support.  Therefore once family is ready we will make a transition of care to comfort care measures when all family are available.   Best practice:  Diet: tube feeding Pain/Anxiety/Delirium protocol RASS score -2 VAP protocol (if indicated): yes DVT prophylaxis: heparin GI prophylaxis: famotidine Glucose control: SSI as above Mobility: bed rest Code Status: full Family Communication: husband updated daily this week Disposition: remain in ICU  Labs   CBC: Recent Labs  Lab 07/07/18 0532  07/08/18 0427  07/08/18 1502 07/09/18 0300 07/12/18 0257 07/12/18 1723 07/13/18 0506  WBC 10.2  --  9.9  --   --  12.0* 9.2  --  13.3*  NEUTROABS 8.6*  --  7.8*  --   --  9.6* 7.1  --  11.1*  HGB 8.4*   < > 8.0*   < > 7.5* 7.8* 6.9* 9.2* 9.4*  HCT 27.7*   < > 25.8*   < > 22.0* 25.2* 23.0* 29.6* 30.0*  MCV 99.6  --  98.9  --   --  99.2 100.0  --  99.7  PLT 95*  --  78*  --   --  87* 66*  --  67*   < > = values in this interval not displayed.    Basic Metabolic Panel: Recent Labs  Lab 07/07/18 0452  07/07/18 1958 07/08/18 0427  07/08/18 1502 07/09/18 0300 07/10/18 0332 07/11/18 0359 07/12/18 0257 07/13/18 0506  NA 145   < > 145 145   < > 145 145 144  --  148* 152*  K 4.4   < > 4.2 4.4   < > 4.4 4.8 4.8  --  4.8 5.1  CL 112*  --  109 111  --   --  112* 112*  --  116* 120*  CO2 24   --  22 23  --   --  20* 24  --  23 23  GLUCOSE 303*  --  250* 167*  --   --  182* 153*  --  155* 144*  BUN 54*  --  66* 74*  --   --  92* 113*  --  141* 146*  CREATININE 2.71*  --  2.94* 3.22*  --   --  3.68* 3.91*  --  3.94* 3.51*  CALCIUM 7.6*  --  7.5* 7.6*  --   --  7.6* 7.5*  --  7.5* 7.7*  MG 2.4  --  2.6* 2.4  --   --   --  3.0* 3.0*  --   --   PHOS 3.9  3.9  --   --  3.9  --   --   --  6.1* 6.7*  --   --    < > =  values in this interval not displayed.   GFR: Estimated Creatinine Clearance: 15.8 mL/min (A) (by C-G formula based on SCr of 3.51 mg/dL (H)). Recent Labs  Lab 07/08/18 0427 07/09/18 0300 07/12/18 0257 07/13/18 0506  WBC 9.9 12.0* 9.2 13.3*    Liver Function Tests: Recent Labs  Lab 07/07/18 0452  ALBUMIN 1.4*   No results for input(s): LIPASE, AMYLASE in the last 168 hours. No results for input(s): AMMONIA in the last 168 hours.  ABG    Component Value Date/Time   PHART 7.334 (L) 07/08/2018 1502   PCO2ART 43.4 07/08/2018 1502   PO2ART 54.0 (L) 07/08/2018 1502   HCO3 23.3 07/08/2018 1502   TCO2 25 07/08/2018 1502   ACIDBASEDEF 3.0 (H) 07/08/2018 1502   O2SAT 86.0 07/08/2018 1502     Coagulation Profile: No results for input(s): INR, PROTIME in the last 168 hours.  Cardiac Enzymes: No results for input(s): CKTOTAL, CKMB, CKMBINDEX, TROPONINI in the last 168 hours.  HbA1C: Hgb A1c MFr Bld  Date/Time Value Ref Range Status  06/27/2018 06:41 AM 6.5 (H) 4.8 - 5.6 % Final    Comment:    (NOTE) Pre diabetes:          5.7%-6.4% Diabetes:              >6.4% Glycemic control for   <7.0% adults with diabetes     CBG: Recent Labs  Lab 07/12/18 2024 07/12/18 2353 07/13/18 0413 07/13/18 0825 07/13/18 1224  GLUCAP 120* 136* 125* 123* 122*     This patient is critically ill with multiple organ system failure; which, requires frequent high complexity decision making, assessment, support, evaluation, and titration of therapies. This was completed  through the application of advanced monitoring technologies and extensive interpretation of multiple databases. During this encounter critical care time was devoted to patient care services described in this note for 38 minutes.   Garner Nash, DO [facility] 07/13/2018 2:59 PM  Personal pager: [phone] If unanswered, please page CCM On-call: [phone]

## 2018-07-13 NOTE — Progress Notes (Signed)
Pt transported on ventilator to CT3 from  3M12 and back with RN.

## 2018-07-13 NOTE — Progress Notes (Signed)
Wasted in sink 40 cc Versed. Witnessed by Allegra Grana, RN.

## 2018-07-14 DIAGNOSIS — E8770 Fluid overload, unspecified: Secondary | ICD-10-CM

## 2018-07-14 LAB — GLUCOSE, CAPILLARY
Glucose-Capillary: 115 mg/dL — ABNORMAL HIGH (ref 70–99)
Glucose-Capillary: 117 mg/dL — ABNORMAL HIGH (ref 70–99)
Glucose-Capillary: 121 mg/dL — ABNORMAL HIGH (ref 70–99)
Glucose-Capillary: 143 mg/dL — ABNORMAL HIGH (ref 70–99)
Glucose-Capillary: 149 mg/dL — ABNORMAL HIGH (ref 70–99)
Glucose-Capillary: 152 mg/dL — ABNORMAL HIGH (ref 70–99)

## 2018-07-14 MED ORDER — FUROSEMIDE 10 MG/ML IJ SOLN
80.0000 mg | Freq: Three times a day (TID) | INTRAMUSCULAR | Status: DC
  Administered 2018-07-14 – 2018-07-16 (×6): 80 mg via INTRAVENOUS
  Filled 2018-07-14 (×7): qty 8

## 2018-07-14 MED ORDER — LABETALOL HCL 5 MG/ML IV SOLN
10.0000 mg | INTRAVENOUS | Status: DC | PRN
  Administered 2018-07-14 – 2018-07-15 (×4): 10 mg via INTRAVENOUS
  Filled 2018-07-14 (×4): qty 4

## 2018-07-14 NOTE — Progress Notes (Signed)
NAME:  Michele [name], MRN:  [id number], DOB:  [birth year], LOS: 62 ADMISSION DATE:  07/11/2018, CONSULTATION DATE:  07/03/2018 REFERRING MD:  Thereasa Solo, CHIEF COMPLAINT:  Dyspnea   Brief History   73 y/o female with small cel lung cancer treated with etoposide and carboplatin completed 05/2018 admitted for acute hypoxemic respiratory failure with progressive infiltrates and a PE, required intubation.    Past Medical History  Small cell lung cancer  Significant Hospital Events   2/13 - echo - diast dysfn, EF 65% 2/14 respiratory failure 2/15 -  1005 fio2/peep 5 . Fever 101F WBC 10. Dysnch with vent. On fent gtt. Not on pressors. ECHO 07/02/2018 - ef 65%. Has new AKI creat 1.12. PCT 0.5 and borderline 2/17 started empiric steroids 2/17-2/23 improved oxygenation, worsening renal failure  Consults:  PCCM  Procedures:  Portocath long standing ETT 2/14>>> 2/16 PICC left arm>   Significant Diagnostic Tests:  2/12 CT angiogram chest > small subsegmental PE, diffuse bilateral infiltrates  Micro Data:  Blood 2/14>>> negative Urine 2/14>>> negative Sputum 2/14>>> negative BAL 2/14>>> negative RV  21/12 - neg Urine strep negative  Antimicrobials:  Cefepime 2/14>>> off Vancomycin 2/14>>> 2/14  Interim history/subjective:  Intubated on mechanical ventilation, unresponsive.  Discussed care with son-in-law at bedside this morning.  Objective   Blood pressure (!) 173/57, pulse 92, temperature 98 F (36.7 C), temperature source Axillary, resp. rate 20, height 5' 4.02" (1.626 m), weight 91.1 kg, SpO2 93 %.    Vent Mode: PRVC FiO2 (%):  [50 %] 50 % Set Rate:  [24 bmp] 24 bmp Vt Set:  [380 mL] 380 mL PEEP:  [8 cmH20] 8 cmH20 Plateau Pressure:  [15 cmH20-26 cmH20] 24 cmH20   Intake/Output Summary (Last 24 hours) at 07/14/2018 1414 Last data filed at 07/14/2018 1100 Gross per 24 hour  Intake 1425 ml  Output 1310 ml  Net 115 ml   Filed Weights   07/12/18 0500 07/13/18 0500  07/14/18 0408  Weight: 93.5 kg 91.1 kg 91.1 kg    Examination:  General: Chronically ill-appearing, elderly female, intubated on mechanical ventilation HENT: NCAT, sclera clear, pupils reactive PULM: Bilateral ventilated breath sounds, no crackles no wheeze CV: Regular rate and rhythm, S1-S2 no MRG's GI: Bowel sounds present MSK: Significant edema bilaterally Derm: Dependent third spaced edema Neuro: Patient's sedated on mechanical ventilation  Resolved Hospital Problem list   HCAP  Assessment & Plan:   Acute hypoxemic respiratory failure requiring intubation mechanical ventilation, significant groundglass opacities on CT imaging consistent with ARDS, bilateral infiltrates, possible drug reaction as well as vascular congestion and increased volume with bilateral pleural effusion and consolidation.   Continue on Solu-Medrol Currently continue mechanical support. We have ongoing discussions with family regarding potential withdrawal of care. They are planning for this possibly Friday.  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  Goals of care discussion: Family discussions continued at this time.  The family understanding is they are planning to withdraw care potentially Friday.  The son-in-law is working with the daughter and patient's son to consider withdrawing earlier potentially tomorrow because they  do not want to wait or prolong this process.  However they are having some coping difficulties amongst  themselves.  I explained to him that we would be here to help support them through this difficult time.  Best practice:  Diet: tube feeding Pain/Anxiety/Delirium protocol RASS score -2 VAP protocol (if indicated): yes DVT prophylaxis: heparin GI prophylaxis: famotidine Glucose control: SSI as above Mobility: bed rest Code Status: full Family Communication: husband updated daily this week Disposition: remain in ICU  Labs   CBC: Recent Labs  Lab 07/08/18 0427  07/08/18 1502 07/09/18 0300 07/12/18 0257 07/12/18 1723 07/13/18 0506  WBC 9.9  --   --  12.0* 9.2  --  13.3*  NEUTROABS 7.8*  --   --  9.6* 7.1  --  11.1*  HGB 8.0*   < > 7.5* 7.8* 6.9* 9.2* 9.4*  HCT 25.8*   < > 22.0* 25.2* 23.0* 29.6* 30.0*  MCV 98.9  --   --  99.2 100.0  --  99.7  PLT 78*  --   --  87* 66*  --  67*   < > = values in this interval not displayed.    Basic Metabolic Panel: Recent Labs  Lab 07/07/18 1958 07/08/18 0427  07/08/18 1502 07/09/18 0300 07/10/18 0332 07/11/18 0359 07/12/18 0257 07/13/18 0506  NA 145 145   < > 145 145 144  --  148* 152*  K 4.2 4.4   < > 4.4 4.8 4.8  --  4.8 5.1  CL 109 111  --   --  112* 112*  --  116* 120*  CO2 22 23  --   --  20* 24  --  23 23  GLUCOSE 250* 167*  --   --  182* 153*  --  155* 144*  BUN 66* 74*  --   --  92* 113*  --  141* 146*  CREATININE 2.94* 3.22*  --   --  3.68* 3.91*  --  3.94* 3.51*  CALCIUM 7.5* 7.6*  --   --  7.6* 7.5*  --  7.5* 7.7*  MG 2.6* 2.4  --   --   --  3.0* 3.0*  --   --   PHOS  --  3.9  --   --   --  6.1* 6.7*  --   --    < > = values in this interval not displayed.   GFR: Estimated Creatinine Clearance: 15.8 mL/min (A) (by C-G formula based on SCr of 3.51 mg/dL (H)). Recent Labs  Lab 07/08/18 0427 07/09/18 0300 07/12/18 0257 07/13/18 0506  WBC 9.9 12.0* 9.2 13.3*    Liver Function Tests: No results for input(s): AST, ALT, ALKPHOS, BILITOT, PROT, ALBUMIN in the last 168 hours. No results for input(s): LIPASE, AMYLASE  in the last 168 hours. No results for input(s): AMMONIA in the last 168 hours.  ABG    Component Value Date/Time   PHART 7.334 (L) 07/08/2018 1502   PCO2ART 43.4 07/08/2018 1502   PO2ART 54.0 (L) 07/08/2018 1502   HCO3 23.3 07/08/2018 1502   TCO2 25 07/08/2018 1502   ACIDBASEDEF 3.0 (H) 07/08/2018 1502   O2SAT 86.0 07/08/2018 1502     Coagulation Profile: No results for input(s): INR, PROTIME in the last 168 hours.  Cardiac Enzymes: No results for input(s): CKTOTAL, CKMB, CKMBINDEX, TROPONINI in the last 168 hours.  HbA1C: Hgb A1c MFr Bld  Date/Time Value Ref Range Status  06/27/2018 06:41 AM 6.5 (H)  4.8 - 5.6 % Final    Comment:    (NOTE) Pre diabetes:          5.7%-6.4% Diabetes:              >6.4% Glycemic control for   <7.0% adults with diabetes     CBG: Recent Labs  Lab 07/13/18 1954 07/13/18 2318 07/14/18 0348 07/14/18 0816 07/14/18 1159  GLUCAP 122* 115* 121* 143* 115*    This patient is critically ill with multiple organ system failure; which, requires frequent high complexity decision making, assessment, support, evaluation, and titration of therapies. This was completed through the application of advanced monitoring technologies and extensive interpretation of multiple databases. During this encounter critical care time was devoted to patient care services described in this note for 35 minutes.   Garner Nash, DO [facility] 07/14/2018 4:05 PM  Personal pager: [phone] If unanswered, please page CCM On-call: [phone]

## 2018-07-14 NOTE — Progress Notes (Signed)
Chaplain Note:  Pt's husband came to office to request a chaplain be present with family on Thursday between 53 and 10:30 am He indicated that as the time family will gather and he anticipates withdrawal of life support. I assured him that we will have a chaplain present to support hm and his family and to offer prayers for Mrs. Sassone.   Wells Guiles  Director [facility] # [phone] or pager [phone].

## 2018-07-14 NOTE — Progress Notes (Signed)
[facility] Progress Note Patient Name: Michele [name] DOB: 1946-02-05 MRN: [id number]   Date of Service  07/14/2018  HPI/Events of Note  High BP  eICU Interventions  Prn labetalol     Intervention Category Intermediate Interventions: Hypertension - evaluation and management  Michele [name] V. Cathye Kreiter 07/14/2018, 9:18 PM

## 2018-07-15 LAB — GLUCOSE, CAPILLARY
GLUCOSE-CAPILLARY: 145 mg/dL — AB (ref 70–99)
Glucose-Capillary: 105 mg/dL — ABNORMAL HIGH (ref 70–99)
Glucose-Capillary: 138 mg/dL — ABNORMAL HIGH (ref 70–99)
Glucose-Capillary: 141 mg/dL — ABNORMAL HIGH (ref 70–99)
Glucose-Capillary: 146 mg/dL — ABNORMAL HIGH (ref 70–99)
Glucose-Capillary: 150 mg/dL — ABNORMAL HIGH (ref 70–99)
Glucose-Capillary: 154 mg/dL — ABNORMAL HIGH (ref 70–99)

## 2018-07-15 NOTE — Progress Notes (Signed)
Nutrition Brief Note  Chart reviewed. TF continues at rate of 20 ml/hr (this does not meet pt's nutritional needs). Noted plan to transition to comfort care tomorrow.  No further nutrition interventions warranted at this time.  Please re-consult as needed.   Kerman Passey MS, RD, [place], [place] [phone] Pager  [phone] Weekend/On-Call Pager

## 2018-07-15 NOTE — Progress Notes (Signed)
NAME:  Michele [name], MRN:  [id number], DOB:  Jan 02, 1946, LOS: 65 ADMISSION DATE:  06/21/2018, CONSULTATION DATE:  07/03/2018 REFERRING MD:  Thereasa Solo, CHIEF COMPLAINT:  Dyspnea   Brief History   73 y/o female with small cel lung cancer treated with etoposide and carboplatin completed 05/2018 admitted for acute hypoxemic respiratory failure with progressive infiltrates and a PE, required intubation.   Past Medical History  Small cell lung cancer  Significant Hospital Events   2/13 - echo - diast dysfn, EF 65% 2/14 respiratory failure 2/15 -  1005 fio2/peep 5 . Fever 101F WBC 10. Dysnch with vent. On fent gtt. Not on pressors. ECHO 07/02/2018 - ef 65%. Has new AKI creat 1.12. PCT 0.5 and borderline 2/17 started empiric steroids 2/17-2/23 improved oxygenation, worsening renal failure  Consults:  PCCM  Procedures:  Portocath long standing ETT 2/14>>> 2/16 PICC left arm>   Significant Diagnostic Tests:  2/12 CT angiogram chest > small subsegmental PE, diffuse bilateral infiltrates  Micro Data:  Blood 2/14>>> negative Urine 2/14>>> negative Sputum 2/14>>> negative BAL 2/14>>> negative RV  21/12 - neg Urine strep negative  Antimicrobials:  Cefepime 2/14>>> off Vancomycin 2/14>>> 2/14  Interim history/subjective:  Remains on vent and requiring sedation for vent synchrony. Still on 50% and 8PEEP.   Objective   Blood pressure (!) 160/51, pulse 83, temperature 98.9 F (37.2 C), temperature source Oral, resp. rate (!) 26, height 5' 4.02" (1.626 m), weight 91.3 kg, SpO2 94 %.    Vent Mode: PRVC FiO2 (%):  [50 %] 50 % Set Rate:  [24 bmp] 24 bmp Vt Set:  [380 mL] 380 mL PEEP:  [8 cmH20] 8 cmH20 Plateau Pressure:  [21 cmH20-24 cmH20] 24 cmH20   Intake/Output Summary (Last 24 hours) at 07/15/2018 0935 Last data filed at 07/15/2018 0600 Gross per 24 hour  Intake 1230.03 ml  Output 3190 ml  Net -1959.97 ml   Filed Weights   07/13/18 0500 07/14/18 0408 07/15/18 0432  Weight:  91.1 kg 91.1 kg 91.3 kg    Examination: General: Elderly female on vent.  HENT: [place]/AT, Pupils pinpoint.  PULM: Coarse bilateral breath sounds CV: RRR, no MRG GI: Normoactive bowel sounds.  MSK: Significant edema bilaterally upper and lower extremities.  Derm: Dependent third spaced edema Neuro: Patient's sedated on mechanical ventilation. RASS -4.   Resolved Hospital Problem list   HCAP  Assessment & Plan:   Acute hypoxemic respiratory failure requiring intubation mechanical ventilation, significant groundglass opacities on CT imaging consistent with ARDS, bilateral infiltrates, possible drug reaction as well as vascular congestion and increased volume with bilateral pleural effusion and consolidation.   - Continue on Solu-Medrol - Currently continue mechanical support. - Family prepared to pursue a withdrawal of life sustaining care 2/27 should she not improve.   Acute pulmonary embolism, subsegmental in the presence of recent malignancy status post chemotherapy holding anticoagulation at this time - Holding anticoagulation  Symptomatic anemia - Continue to observe no evidence of active bleeding  Circulatory shock presumably due to sedation at this point: Resolved. Off vasopressors at this time will continue to observe  Hyperglycemia: - SSI  AKI: Creatinine stable, good UOP 2/22>2/23 - Continue lasix.  - Holding further lab draws for comfort.  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  Global: No family present at this time, however, it is my understanding via the medical record and discussion with RN that family is planning to transition to comfort measures on 2/27  around 10am.   Best practice:  Diet: tube feeding Pain/Anxiety/Delirium protocol RASS score -1 to -2 VAP protocol (if indicated): yes DVT prophylaxis: heparin GI prophylaxis: famotidine Glucose control: SSI as above Mobility: bed rest Code Status:  DNR Family Communication: No family bedside, but there has been a lot of discussion with family and they are well aware of the current situation.  Disposition: remain in ICU  Labs   CBC: Recent Labs  Lab 07/08/18 1502 07/09/18 0300 07/12/18 0257 07/12/18 1723 07/13/18 0506  WBC  --  12.0* 9.2  --  13.3*  NEUTROABS  --  9.6* 7.1  --  11.1*  HGB 7.5* 7.8* 6.9* 9.2* 9.4*  HCT 22.0* 25.2* 23.0* 29.6* 30.0*  MCV  --  99.2 100.0  --  99.7  PLT  --  87* 66*  --  67*    Basic Metabolic Panel: Recent Labs  Lab 07/08/18 1502 07/09/18 0300 07/10/18 0332 07/11/18 0359 07/12/18 0257 07/13/18 0506  NA 145 145 144  --  148* 152*  K 4.4 4.8 4.8  --  4.8 5.1  CL  --  112* 112*  --  116* 120*  CO2  --  20* 24  --  23 23  GLUCOSE  --  182* 153*  --  155* 144*  BUN  --  92* 113*  --  141* 146*  CREATININE  --  3.68* 3.91*  --  3.94* 3.51*  CALCIUM  --  7.6* 7.5*  --  7.5* 7.7*  MG  --   --  3.0* 3.0*  --   --   PHOS  --   --  6.1* 6.7*  --   --    GFR: Estimated Creatinine Clearance: 15.8 mL/min (A) (by C-G formula based on SCr of 3.51 mg/dL (H)). Recent Labs  Lab 07/09/18 0300 07/12/18 0257 07/13/18 0506  WBC 12.0* 9.2 13.3*    Liver Function Tests: No results for input(s): AST, ALT, ALKPHOS, BILITOT, PROT, ALBUMIN in the last 168 hours. No results for input(s): LIPASE, AMYLASE in the last 168 hours. No results for input(s): AMMONIA in the last 168 hours.  ABG    Component Value Date/Time   PHART 7.334 (L) 07/08/2018 1502   PCO2ART 43.4 07/08/2018 1502   PO2ART 54.0 (L) 07/08/2018 1502   HCO3 23.3 07/08/2018 1502   TCO2 25 07/08/2018 1502   ACIDBASEDEF 3.0 (H) 07/08/2018 1502   O2SAT 86.0 07/08/2018 1502     Coagulation Profile: No results for input(s): INR, PROTIME in the last 168 hours.  Cardiac Enzymes: No results for input(s): CKTOTAL, CKMB, CKMBINDEX, TROPONINI in the last 168 hours.  HbA1C: Hgb A1c MFr Bld  Date/Time Value Ref Range Status  06/27/2018  06:41 AM 6.5 (H) 4.8 - 5.6 % Final    Comment:    (NOTE) Pre diabetes:          5.7%-6.4% Diabetes:              >6.4% Glycemic control for   <7.0% adults with diabetes     CBG: Recent Labs  Lab 07/14/18 1945 07/14/18 2346 07/15/18 0359 07/15/18 0754 07/15/18 0923  GLUCAP 149* 152* 138* 145* 146*    Critical Care time: 30 mins.  Georgann Housekeeper, AGACNP-BC [place] Pager [phone] or [phone]  07/15/2018 9:44 AM

## 2018-07-16 LAB — GLUCOSE, CAPILLARY
Glucose-Capillary: 120 mg/dL — ABNORMAL HIGH (ref 70–99)
Glucose-Capillary: 148 mg/dL — ABNORMAL HIGH (ref 70–99)

## 2018-07-16 MED ORDER — POLYVINYL ALCOHOL 1.4 % OP SOLN
1.0000 [drp] | Freq: Four times a day (QID) | OPHTHALMIC | Status: DC | PRN
  Filled 2018-07-16: qty 15

## 2018-07-16 MED ORDER — ACETAMINOPHEN 650 MG RE SUPP: 650.0000 mg | Freq: Four times a day (QID) | RECTAL | Status: DC | PRN

## 2018-07-16 MED ORDER — DEXTROSE 5 % IV SOLN: INTRAVENOUS | Status: DC

## 2018-07-16 MED ORDER — GLYCOPYRROLATE 1 MG PO TABS: 1.0000 mg | ORAL_TABLET | ORAL | Status: DC | PRN

## 2018-07-16 MED ORDER — GLYCOPYRROLATE 0.2 MG/ML IJ SOLN: 0.2000 mg | INTRAMUSCULAR | Status: DC | PRN

## 2018-07-16 MED ORDER — MORPHINE SULFATE (PF) 2 MG/ML IV SOLN: 2.0000 mg | INTRAVENOUS | Status: DC | PRN

## 2018-07-16 MED ORDER — MORPHINE BOLUS VIA INFUSION
5.0000 mg | INTRAVENOUS | Status: DC | PRN
  Filled 2018-07-16: qty 5

## 2018-07-16 MED ORDER — ACETAMINOPHEN 325 MG PO TABS: 650.0000 mg | ORAL_TABLET | Freq: Four times a day (QID) | ORAL | Status: DC | PRN

## 2018-07-16 MED ORDER — DIPHENHYDRAMINE HCL 50 MG/ML IJ SOLN: 25.0000 mg | INTRAMUSCULAR | Status: DC | PRN

## 2018-07-16 MED ORDER — MORPHINE 100MG IN NS 100ML (1MG/ML) PREMIX INFUSION
0.0000 mg/h | INTRAVENOUS | Status: DC
  Administered 2018-07-16: 5 mg/h via INTRAVENOUS
  Filled 2018-07-16: qty 100

## 2018-07-19 NOTE — Progress Notes (Signed)
44mL Morphine wasted, witnessed by Heide Guile, RN

## 2018-07-19 NOTE — Death Summary Note (Signed)
DEATH SUMMARY   Patient Details  Name: Michele [name] MRN: [id number] DOB: [birth year]  Admission/Discharge Information   Admit Date:  07-02-18  Date of Death: Date of Death: 07/17/2018  Time of Death: Time of Death: 1310/07/30  Length of Stay: 2022/08/03  Referring Physician: Ronita Hipps, MD   Reason(s) for Hospitalization  Bilateraly pneumonia, possible drug induced lung injury  Diagnoses  Preliminary cause of death: Acute lung injury Secondary Diagnoses (including complications and co-morbidities):  Principal Problem:   Acute on chronic respiratory failure with hypoxia ([place]) Active Problems:   Mediastinal adenopathy   Mass of lower lobe of left lung   Hilar adenopathy   Essential hypertension   Hyperlipidemia   Sepsis ([place])   HCAP (healthcare-associated pneumonia)   Pulmonary embolism [facility])   Brief Hospital Course (including significant findings, care, treatment, and services provided and events leading to death)  Michele [name] is a 73 y.o. 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  Patient ultimately developed ARDS-like physiology with bilateral significant infiltrates concerning for drug-induced versus acute lung injury.  Also had an acute pulmonary embolism in the presence of malignancy.  Ongoing circulatory shock.  As the patient continued to not improve we discussed goals of care with family.  Family made the decision for no escalation of care.  And the final decision was to remove from mechanical support to allow patient to die a natural death.  Patient was withdrawn from mechanical support and placed on comfort care.  The patient passed away peacefully with family at bedside.  Pertinent Labs and Studies  Significant Diagnostic Studies Ct Chest Wo Contrast  Result Date: 07/13/2018 CLINICAL DATA:  Small cell lung  cancer status post chemotherapy and radiation therapy. 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 EXAM: CT CHEST WITHOUT CONTRAST TECHNIQUE: Multidetector CT imaging of the chest was performed following the standard protocol without IV contrast. COMPARISON:  02-Jul-2018, 06/08/2018, 06/01/2018, 05/06/2018, 12/26/2017 and 09/29/2013. FINDINGS: Cardiovascular: Right IJ Port-A-Cath terminates in the SVC. Left PICC terminates in the SVC as well. Atherosclerotic calcification of the aorta and coronary arteries. Heart size within normal limits. No pericardial effusion. Mediastinum/Nodes: Mediastinal lymph nodes are not enlarged by CT size criteria. Hilar regions are difficult to evaluate without IV contrast. Nasogastric tube is seen in the esophagus. Lungs/Pleura: Image quality is degraded by a respiratory motion in this ventilated patient. Endotracheal tube terminates 3.1 cm above the carina. When compared with exams dating back to 06/01/2018, there has been worsening diffuse pulmonary parenchymal ground-glass with areas of consolidation in the left upper lobe and dependent portions of both lower lobes. There does appear to be underlying centrilobular and paraseptal emphysema, bronchiectasis and possibly architectural coarsening, all of which are poorly evaluated due to respiratory motion and extensive ground-glass and consolidation bilaterally. Moderate bilateral pleural effusions. Presumed bullous lesion in the posteromedial right hemithorax (series 6, image 69). Upper Abdomen: Visualized portions of the liver, left adrenal gland, spleen and stomach are unremarkable. Musculoskeletal: Degenerative changes in the spine. No worrisome lytic or sclerotic lesions. IMPRESSION: 1. Continued worsening in diffuse bilateral pulmonary parenchymal ground-glass with areas of consolidation in the left upper and left lower lobes, findings most indicative of worsening pneumonia. Together with respiratory motion, findings preclude  further assessment of underlying interstitial lung disease. 2. Moderate bilateral pleural effusions. 3. Known confluent soft tissue in the perihilar left lower lobe  is obscured. Continued attention on follow-up exams is warranted. 4. Aortic atherosclerosis (ICD10-170.0). Coronary artery calcification. Electronically Signed   By: Lorin Picket M.D.   On: 07/13/2018 12:50   Ct Angio Chest Pe W Or Wo Contrast  Result Date: 06/25/2018 CLINICAL DATA:  73 year old female with shortness of breath EXAM: CT ANGIOGRAPHY CHEST WITH CONTRAST TECHNIQUE: Multidetector CT imaging of the chest was performed using the standard protocol during bolus administration of intravenous contrast. Multiplanar CT image reconstructions and MIPs were obtained to evaluate the vascular anatomy. CONTRAST:  115mL ISOVUE-370 IOPAMIDOL (ISOVUE-370) INJECTION 76% COMPARISON:  Multiple priors including 06/08/2018, 06/01/2018 05/06/2018 FINDINGS: Cardiovascular: Heart: No cardiomegaly. No pericardial fluid/thickening. Calcifications of left main, left anterior descending, right coronary artery. Right IJ port catheter. Aorta: Unremarkable course caliber and contour of the thoracic aorta. Calcifications of the aortic arch and descending thoracic aorta. No periaortic fluid. No dissection Pulmonary arteries: There is a single filling defect within the subsegmental vessels of the right lower lobe. No other filling defects identified. Mediastinum/Nodes: Unremarkable appearance of the thoracic inlet. Multiple lymph nodes of the mediastinum throughout all nodal stations, increasing size comparison CT. Unremarkable course of the thoracic esophagus. Lungs/Pleura: Mixed ground-glass and confluent airspace opacity the bilateral upper lobes, the right middle lobe, bilateral lower lobes. In the lower lobes, there is more dependent distribution. Bilateral small pleural effusions. The appearance has significantly worsened from the most recent CT of 06/08/2018, and  is new from the comparison CT of December. Background of reticulonodular changes. Redemonstration of soft tissue left hilar region in this patient with known carcinoma. Upper Abdomen: No acute. Musculoskeletal: No acute displaced fracture. Degenerative changes of the spine. Review of the MIP images confirms the above findings. . IMPRESSION: Multifocal pneumonia of all lobes, which has significantly progressed from the CT of 06/08/2018. Bilateral small pleural effusions. Infection accounts for the mediastinal adenopathy, which is most likely reactive. The CT is positive for a single pulmonary embolus involving subsegmental vessel right lower lobe. Soft tissue in the left hilar region is compatible with the patient's known history of lung carcinoma. Aortic atherosclerosis with left main and 2 vessel coronary artery disease. Electronically Signed   By: Corrie Mckusick D.O.   On: 06/27/2018 18:23   Mr Brain Wo Contrast  Result Date: 06/27/2018 CLINICAL DATA:  Initial event do a shin for left hand numbness and slurred speech, recent stroke. EXAM: MRI HEAD WITHOUT CONTRAST TECHNIQUE: Multiplanar, multiecho pulse sequences of the brain and surrounding structures were obtained without intravenous contrast. COMPARISON:  Prior head CT from 06/26/2018 as well as recent MRI from 06/22/2018. FINDINGS: Examination is limited with only axial coronal DWI and ADC sequences performed. Normal expected interval evolution of recently identified infarcts involving the right cerebellum, left parieto-occipital region, right occipital region, and high right frontoparietal region, relatively stable in size and distribution as compared to previous MRI from 06/22/2018. However, there is a new area of restricted diffusion involving the cortical and subcortical right frontal lobe, not seen on previous, and consistent with a new ischemic infarct (series 5, image 76). Infarct fairly linear measuring up to approximately 3.5 cm in length. Infarct  primarily involves the right frontal operculum. No other evidence for new or interval ischemia. No other definite new acute intracranial abnormality seen on this limited exam. IMPRESSION: 1. 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 2. Otherwise, normal expected interval evolution  of additional scattered acute to subacute infarcts involving the bilateral cerebral hemispheres and right cerebellum, relatively stable in size and distribution from previous. Electronically Signed   By: Jeannine Boga M.D.   On: 06/27/2018 04:05   Ct Abdomen Pelvis W Contrast  Result Date: 06/27/2018 CLINICAL DATA:  Inpatient. 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 Restaging. EXAM: CT ABDOMEN AND PELVIS WITH CONTRAST TECHNIQUE: Multidetector CT imaging of the abdomen and pelvis was performed using the standard protocol following bolus administration of intravenous contrast. CONTRAST:  158mL OMNIPAQUE IOHEXOL 300 MG/ML  SOLN COMPARISON:  12/31/2017 PET-CT. FINDINGS: Lower chest: Extensive patchy perilobular consolidation and ground-glass opacity throughout both lung bases, significantly increased from 06/08/2018 chest CT. Coronary atherosclerosis. Trace dependent bilateral pleural effusions. Hepatobiliary: Normal liver size. Simple 1.0 cm inferior right liver cyst. No additional liver lesions. Normal gallbladder with no radiopaque cholelithiasis. No biliary ductal dilatation. Pancreas: Normal, with no mass or duct dilation. Spleen: Normal size. No mass. Adrenals/Urinary Tract: Normal adrenals. Asymmetric right renal atrophy, unchanged. No hydronephrosis. No renal masses. Normal bladder. Stomach/Bowel: Small hiatal hernia. Otherwise normal nondistended stomach. Normal caliber small bowel with no small bowel wall thickening. Normal  appendix. Mild sigmoid diverticulosis, with no large bowel wall thickening or significant pericolonic fat stranding. Vascular/Lymphatic: Atherosclerotic nonaneurysmal abdominal aorta. Patent portal, splenic, hepatic and renal veins. No pathologically enlarged lymph nodes in the abdomen or pelvis. Reproductive: Grossly normal uterus.  No adnexal mass. Other: No pneumoperitoneum, ascites or focal fluid collection. Musculoskeletal: No aggressive appearing focal osseous lesions. Moderate thoracolumbar spondylosis. IMPRESSION: 1. No evidence of metastatic disease in the abdomen or pelvis. 2. Extensive patchy perilobular consolidation and ground-glass opacity throughout both lung bases, significantly increased since recent 06/08/2018 chest CT. The distribution is suggestive of organizing pneumonia such as due to drug toxicity. Multilobar infectious or aspiration pneumonia is a consideration. 3. Trace dependent bilateral pleural effusions. 4. Small hiatal hernia. 5. Mild sigmoid diverticulosis. 6. Aortic Atherosclerosis (ICD10-I70.0). Electronically Signed   By: Ilona Sorrel M.D.   On: 06/27/2018 02:06   Dg Chest Port 1 View  Result Date: 07/12/2018 CLINICAL DATA:  Acute respiratory failure EXAM: PORTABLE CHEST 1 VIEW COMPARISON:  07/12/2018 650 hours FINDINGS: Endotracheal tube, NG tube, left arm PICC, right subclavian Port-A-Cath are stable. Airspace disease throughout both lungs is stable. No pneumothorax. Low lung volumes. Small pleural effusions are not excluded. IMPRESSION: Stable airspace disease throughout both lungs. Small pleural effusions are suspected. Electronically Signed   By: Marybelle Killings M.D.   On: 07/12/2018 14:41   Dg Chest Port 1 View  Result Date: 07/12/2018 CLINICAL DATA:  Acute respiratory failure. EXAM: PORTABLE CHEST 1 VIEW COMPARISON:  07/11/2018 FINDINGS: Stable support apparatus. Cardiomediastinal silhouette is normal. Mediastinal contours appear intact. There is no evidence of  pneumothorax. Small bilateral pleural effusions. Patchy bilateral, greater on the left interstitial and alveolar opacities. Osseous structures are without acute abnormality. Soft tissues are grossly normal. IMPRESSION: 1. Patchy bilateral, greater on the left interstitial and alveolar opacities may represent multifocal pneumonia or mixed pattern pulmonary edema. 2. Small bilateral pleural effusions. Electronically Signed   By: Fidela Salisbury M.D.   On: 07/12/2018 09:00   Dg Chest Port 1 View  Result Date: 07/11/2018 CLINICAL DATA:  Respiratory failure EXAM: PORTABLE CHEST 1 VIEW COMPARISON:  07/10/2018 and prior exams FINDINGS: An endotracheal tube with tip 3.5 cm above the carina, RIGHT subclavian Port-A-Cath with tip overlying the mid SVC, LEFT PICC  line with tip overlying the LOWER SVC, and NG tube entering the stomach with tip off the field of view again noted. Bilateral interstitial/airspace opacities, LEFT mid lung airspace disease and LEFT LOWER lung consolidation/atelectasis again identified. Trace bilateral pleural effusions again noted. No pneumothorax. IMPRESSION: Unchanged appearance of the chest as described. Electronically Signed   By: Margarette Canada M.D.   On: 07/11/2018 08:05   Dg Chest Port 1 View  Result Date: 07/10/2018 CLINICAL DATA:  Respiratory failure EXAM: PORTABLE CHEST 1 VIEW COMPARISON:  Yesterday FINDINGS: Endotracheal tube tip at the clavicular heads. Right port and left PICC with tips in good position. The orogastric tube at least reaches the stomach. Interstitial and airspace opacity asymmetrically dense on the left. Normal heart size. No effusion or pneumothorax. IMPRESSION: Stable hardware positioning and bilateral airspace disease. Electronically Signed   By: Monte Fantasia M.D.   On: 07/10/2018 06:13   Dg Chest Port 1 View  Result Date: 07/09/2018 CLINICAL DATA:  Acute respiratory failure with hypoxia. EXAM: PORTABLE CHEST 1 VIEW COMPARISON:  07/08/2018. FINDINGS:  Unchanged tubes and lines. BILATERAL pulmonary opacities are slightly increased, most notably on the LEFT. Unchanged cardiomediastinal silhouette. No pneumothorax. LEFT hilar scarring is consistent with the history of lung cancer. IMPRESSION: Slight worsening pneumonia. Electronically Signed   By: Staci Righter M.D.   On: 07/09/2018 07:22   Dg Chest Port 1 View  Result Date: 07/08/2018 CLINICAL DATA:  Acute respiratory failure with hypoxemia EXAM: PORTABLE CHEST 1 VIEW COMPARISON:  Yesterday FINDINGS: Endotracheal tube tip between the clavicular heads and carina. Left PICC and right subclavian porta catheter with tips at the SVC. The orogastric tube at least reaches the stomach. Diffuse interstitial and airspace opacity. Question mild clearing at the left base. Normal heart size. Small pleural effusions. No pneumothorax. IMPRESSION: Stable and unremarkable hardware positioning. History of pneumonia with borderline improvement in aeration. Electronically Signed   By: Monte Fantasia M.D.   On: 07/08/2018 07:21   Dg Chest Port 1 View  Result Date: 07/07/2018 CLINICAL DATA:  73 year old female with respiratory failure and hypoxia. Subsequent encounter. EXAM: PORTABLE CHEST 1 VIEW COMPARISON:  07/06/2018. FINDINGS: Endotracheal tube has been repositioned with the tip female 3.4 cm above the carina. Right MediPort catheter tip mid superior vena cava level. Left PICC line tip mid superior vena cava level. Nasogastric tube courses below the diaphragm. Tip is not included on the present exam. Diffuse asymmetric airspace disease with minimal improved aeration right lung. This may reflect pulmonary edema and/or multifocal pneumonia. No obvious pneumothorax. Heart size top-normal. IMPRESSION: 1. Endotracheal tube has been repositioned with the tip female 3.4 cm above the carina. 2. Diffuse asymmetric airspace disease with minimal improved aeration right lung. This may reflect pulmonary edema and/or multifocal pneumonia.  Electronically Signed   By: Genia Del M.D.   On: 07/07/2018 07:36   Dg Chest Port 1 View  Result Date: 07/06/2018 CLINICAL DATA:  Endotracheal tube. EXAM: PORTABLE CHEST 1 VIEW COMPARISON:  Yesterday FINDINGS: The endotracheal tube has migrated into the right mainstem. Recommend retraction by 2-3 cm. The orogastric tube at least reaches the stomach. Bilateral central line with tips in good position. Lower volumes with increased generalized opacity. There is history of sepsis and pneumonia. No pneumothorax. These results will be called to the ordering clinician or representative by the Radiologist Assistant, and communication documented in the PACS or zVision Dashboard. IMPRESSION: 1. Endotracheal tube has migrated into the right mainstem. Recommend tube retraction by 2-3 cm.  2. History of pneumonia with interval worsening aeration. Electronically Signed   By: Monte Fantasia M.D.   On: 07/06/2018 05:19   Dg Chest Port 1 View  Result Date: 07/05/2018 CLINICAL DATA:  Post endotracheal tube placement EXAM: PORTABLE CHEST 1 VIEW COMPARISON:  07/04/2018; 07/03/2018; 06/24/2018; chest CT-07/11/2018 FINDINGS: Grossly unchanged cardiac silhouette and mediastinal contours. Slight advancement of endotracheal tube with tip now approximately 1.6 cm above the carina. Otherwise, stable position of support apparatus. Grossly unchanged diffuse interstitial thickening with relative areas of masslike consolidation about the left hilum and right lower lobe. Unchanged suspected trace bilateral effusions. Unchanged biapical pleuroparenchymal thickening. No pneumothorax. No definite evidence of edema. No acute osseous abnormalities. IMPRESSION: 1. Slight advancement of endotracheal tube with tip now approximately 1.6 cm above the carina. Otherwise, stable positioning of support apparatus. No pneumothorax. 2. Grossly unchanged extensive bilateral heterogeneous and consolidative opacities, nonspecific though again worrisome for  micro focal infection, including atypical etiologies. Electronically Signed   By: Sandi Mariscal M.D.   On: 07/05/2018 08:30   Dg Chest Port 1 View  Result Date: 07/04/2018 CLINICAL DATA:  ARDS. EXAM: PORTABLE CHEST 1 VIEW COMPARISON:  Radiographs 07/03/2018 and 06/28/2018.  CT 06/22/2018. FINDINGS: 1305 hours. Endotracheal tube has been partially withdrawn, now lying 6.7 cm above the carina, but below the thoracic inlet. Enteric tube projects below the diaphragm, tip not visualized. Right subclavian Port-A-Cath is unchanged at the lower cavoatrial junction level. The heart size and mediastinal contours are stable. Diffuse bilateral airspace opacities are again noted with slight worsening in the left perihilar region. There is no pneumothorax or significant pleural effusion. IMPRESSION: Slight worsening of the left perihilar component of the bilateral airspace opacities consistent with ARDS/pneumonia. No pneumothorax. Satisfactory position of the support system. Electronically Signed   By: Richardean Sale M.D.   On: 07/04/2018 15:35   Dg Chest Port 1 View  Result Date: 07/03/2018 CLINICAL DATA:  Endotracheal tube placement. EXAM: PORTABLE CHEST 1 VIEW COMPARISON:  Radiographs and CT 07/06/2018. Other chest radiographs over the last month. FINDINGS: 1249 hours. Interval intubation. Tip of the endotracheal tube is in the mid trachea. Enteric tube projects below the diaphragm, tip not visualized. Right subclavian Port-A-Cath tip is at the superior cavoatrial junction. The heart size and mediastinal contours are stable. There has been slight worsening in the diffuse bilateral airspace opacities. Small bilateral pleural effusions appear unchanged. No pneumothorax. IMPRESSION: 1. Satisfactory position of the endotracheal and enteric tubes. 2. Slight worsening in bilateral airspace opacities consistent with pneumonia. Electronically Signed   By: Richardean Sale M.D.   On: 07/03/2018 13:23   Dg Chest Port 1  View  Result Date: 07/12/2018 CLINICAL DATA:  Shortness of breath for several weeks, worsening this morning, wet cough, history lung cancer, atrial fibrillation, stroke, hypertension, former smoker EXAM: PORTABLE CHEST 1 VIEW COMPARISON:  Portable exam 1606 hours compared to 06/11/2018 FINDINGS: RIGHT subclavian Port-A-Cath with tip projecting over SVC. Normal heart size and mediastinal contours. Diffuse pulmonary infiltrates bilaterally significantly increased from previous exam. No pleural effusion or pneumothorax. Bones demineralized. IMPRESSION: Significant increase in diffuse BILATERAL pulmonary infiltrates since 06/11/2018. Electronically Signed   By: Lavonia Dana M.D.   On: 06/25/2018 16:34   Dg Chest Port 1v Same Day  Result Date: 07/04/2018 CLINICAL DATA:  Intubation EXAM: PORTABLE CHEST 1 VIEW COMPARISON:  Portable exam 1632 hours compared to 07/04/2018 at 1305 hours FINDINGS: Tip of endotracheal tube projects 3.5 cm above carina. Nasogastric tube extends into stomach. RIGHT  subclavian Port-A-Cath with tip projecting over SVC. Normal heart size mediastinal contours. Atherosclerotic calcifications aorta. Diffuse BILATERAL pulmonary infiltrates which could represent pneumonia or ARDS. Minimal RIGHT pleural effusion. No pneumothorax. Bones demineralized. IMPRESSION: Persistent diffuse BILATERAL pulmonary infiltrates question pneumonia versus ARDS. Electronically Signed   By: Lavonia Dana M.D.   On: 07/04/2018 17:16   Dg Abd Portable 1v  Result Date: 07/04/2018 CLINICAL DATA:  Orogastric tube placement EXAM: PORTABLE ABDOMEN - 1 VIEW COMPARISON:  Portable exam 1640 hours compared to CT abdomen and pelvis of 06/27/2018 FINDINGS: Tip of orogastric tube projects over distal gastric or pylorus. Paucity of bowel gas in upper abdomen. Severe diffuse BILATERAL pulmonary infiltrates. Osseous structures unremarkable. IMPRESSION: Tip of nasogastric tube projects over distal gastric antrum or pylorus.  Electronically Signed   By: Lavonia Dana M.D.   On: 07/04/2018 17:17   Ct Head Code Stroke Wo Contrast  Result Date: 06/26/2018 CLINICAL DATA:  Code stroke.  Left-sided droop and slurred speech EXAM: CT HEAD WITHOUT CONTRAST TECHNIQUE: Contiguous axial images were obtained from the base of the skull through the vertex without intravenous contrast. COMPARISON:  06/20/2018 FINDINGS: Brain: Known subacute infarct in the left PCA distribution affecting the occipital lobe. Small early subacute infarcts seen in the posterior right frontal cortex. Small recent right cerebellar infarct. No acute hemorrhage, hydrocephalus, or collection. Vascular: No hyperdense vessel Skull: Negative Sinuses/Orbits: Secretions layer within the maxillary sinuses. There is bilateral ethmoid and maxillary opacification. Partial bilateral mastoid opacification. These findings were seen on prior brain MRI. Other: These results were communicated to Dr. Cheral Marker at 11:06 amon 2/7/2020by text page via the [facility] messaging system. ASPECTS [facility] Stroke Program Early CT Score) Not scored in this clinical setting. IMPRESSION: 1. Known subacute infarcts in the left occipital cortex, right posterior frontal cortex, and right cerebellum. 2. No evidence of interval infarct or hemorrhagic conversion. 3. Active sinusitis. Electronically Signed   By: Monte Fantasia M.D.   On: 06/26/2018 11:08   Vas [place] Lower Extremity Venous (dvt)  Result Date: 06/27/2018  Lower Venous Study Indications: Stroke.  Limitations: Patient unable to fully turn knees out. Comparison Study: No prior study on file Performing Technologist: Sharion Dove RVS  Examination Guidelines: A complete evaluation includes B-mode imaging, spectral Doppler, color Doppler, and power Doppler as needed of all accessible portions of each vessel. Bilateral testing is considered an integral part of a complete examination. Limited examinations for reoccurring indications may be performed as noted.   Right Venous Findings: +---------+---------------+---------+-----------+----------+-------+          CompressibilityPhasicitySpontaneityPropertiesSummary +---------+---------------+---------+-----------+----------+-------+ CFV      Full           Yes      Yes                          +---------+---------------+---------+-----------+----------+-------+ SFJ      Full                                                 +---------+---------------+---------+-----------+----------+-------+ FV Prox  Full                                                 +---------+---------------+---------+-----------+----------+-------+ FV  Mid   Full                                                 +---------+---------------+---------+-----------+----------+-------+ FV DistalFull                                                 +---------+---------------+---------+-----------+----------+-------+ PFV      Full                                                 +---------+---------------+---------+-----------+----------+-------+ POP      Full           Yes      Yes                          +---------+---------------+---------+-----------+----------+-------+ PTV      Full                                                 +---------+---------------+---------+-----------+----------+-------+ PERO     Full                                                 +---------+---------------+---------+-----------+----------+-------+  Left Venous Findings: +---------+---------------+---------+-----------+----------+----------+          CompressibilityPhasicitySpontaneityPropertiesSummary    +---------+---------------+---------+-----------+----------+----------+ CFV      Full                                                    +---------+---------------+---------+-----------+----------+----------+ SFJ      Full                                                     +---------+---------------+---------+-----------+----------+----------+ FV Prox  Full                                                    +---------+---------------+---------+-----------+----------+----------+ FV Mid   Full                                                    +---------+---------------+---------+-----------+----------+----------+ FV DistalFull                                                    +---------+---------------+---------+-----------+----------+----------+  PFV      Full                                                    +---------+---------------+---------+-----------+----------+----------+ POP                     Yes      Yes                  visualized +---------+---------------+---------+-----------+----------+----------+ PTV      Full                                                    +---------+---------------+---------+-----------+----------+----------+ PERO     Full                                                    +---------+---------------+---------+-----------+----------+----------+    Summary: Right: There is no evidence of deep vein thrombosis in the lower extremity. Left: There is no evidence of deep vein thrombosis in the lower extremity. However, portions of this examination were limited- see technologist comments above.  *See table(s) above for measurements and observations. Electronically signed by Monica Martinez MD on 06/27/2018 at 5:50:20 PM.    Final    [place] Ekg Site Rite  Result Date: 07/04/2018 If Site Rite image not attached, placement could not be confirmed due to current cardiac rhythm.   Microbiology No results found for this or any previous visit (from the past 240 hour(s)).  Lab Basic Metabolic Panel: Recent Labs  Lab 07/10/18 0332 07/11/18 0359 07/12/18 0257 07/13/18 0506  NA 144  --  148* 152*  K 4.8  --  4.8 5.1  CL 112*  --  116* 120*  CO2 24  --  23 23  GLUCOSE 153*  --  155* 144*  BUN 113*   --  141* 146*  CREATININE 3.91*  --  3.94* 3.51*  CALCIUM 7.5*  --  7.5* 7.7*  MG 3.0* 3.0*  --   --   PHOS 6.1* 6.7*  --   --    Liver Function Tests: No results for input(s): AST, ALT, ALKPHOS, BILITOT, PROT, ALBUMIN in the last 168 hours. No results for input(s): LIPASE, AMYLASE in the last 168 hours. No results for input(s): AMMONIA in the last 168 hours. CBC: Recent Labs  Lab 07/12/18 0257 07/12/18 1723 07/13/18 0506  WBC 9.2  --  13.3*  NEUTROABS 7.1  --  11.1*  HGB 6.9* 9.2* 9.4*  HCT 23.0* 29.6* 30.0*  MCV 100.0  --  99.7  PLT 66*  --  67*   Cardiac Enzymes: No results for input(s): CKTOTAL, CKMB, CKMBINDEX, TROPONINI in the last 168 hours. Sepsis Labs: Recent Labs  Lab 07/12/18 0257 07/13/18 0506  WBC 9.2 13.3*    Procedures/Operations   Portocath long standing ETT 2/14>>> 2/16 PICC left arm>    Leory Plowman L Akaash Vandewater 2018-07-17, 3:21 PM

## 2018-07-19 NOTE — Progress Notes (Signed)
Responded to chaplain referral to continue support to family.  Family planning to withdraw life support today and requested chaplain's presence.  Nurse will page chaplain when family arrives and is ready.  Chaplain available as needed.  Jaclynn Major, [place], [facility], Pager [phone]

## 2018-07-19 NOTE — Progress Notes (Signed)
Provided support for family as they want to remove the machines keeping her alive.  They don't want to prolong any suffering.  Prayers for peace with patient and family and to celebrate Ms. Bramble's life. Surrounded patient and family has already spent time with patient telling her what they wanted to tell her (whether she was able to understand or not) Family very appreciative.  Page chaplain if needed to return later. MAIZE BRITTINGHAM, Chaplain   2018/08/08 1100  Clinical Encounter Type  Visited With Patient and family together  Visit Type Spiritual support;Critical Care  Referral From Family;Physician  Consult/Referral To Chaplain  Spiritual Encounters  Spiritual Needs Prayer;Emotional;Other (Comment) (preparing to withdraw artificial life support)  Stress Factors  Family Stress Factors Health changes;Other (Comment) (preparing for withdrawall of machines)

## 2018-07-19 NOTE — Progress Notes (Signed)
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   Past Medical History  Small cell lung cancer  Significant Hospital Events   2/13 - echo - diast dysfn, EF 65% 2/14 respiratory failure 2/15 -  1005 fio2/peep 5 . Fever 101F WBC 10. Dysnch with vent. On fent gtt. Not on pressors. ECHO 07/02/2018 - ef 65%. Has new AKI creat 1.12. PCT 0.5 and borderline 2/17 started empiric steroids 2/17-2/23 improved oxygenation, worsening renal failure  Consults:  PCCM  Procedures:  Portocath long standing ETT 2/14>>> 2/16 PICC left arm>   Significant Diagnostic Tests:  2/12 CT angiogram chest > small subsegmental PE, diffuse bilateral infiltrates  Micro Data:  Blood 2/14>>> negative Urine 2/14>>> negative Sputum 2/14>>> negative BAL 2/14>>> negative RV  21/12 - neg Urine strep negative  Antimicrobials:  Cefepime 2/14>>> off Vancomycin 2/14>>> 2/14  Interim history/subjective:  Family gathered at bedside prepared for comfort care.    Objective   Blood pressure 131/64, pulse 81, temperature 98.9 F (37.2 C), temperature source Axillary, resp. rate (!) 22, height 5' 4.02" (1.626 m), weight 86.6 kg, SpO2 96 %.    Vent Mode: PRVC FiO2 (%):  [50 %] 50 % Set Rate:  [24 bmp] 24 bmp Vt Set:  [380 mL] 380 mL PEEP:  [8 cmH20] 8 cmH20 Plateau Pressure:  [9 cmH20-20 cmH20] 20 cmH20   Intake/Output Summary (Last 24 hours) at 07-21-2018 1120 Last data filed at Jul 21, 2018 0800 Gross per 24 hour  Intake 1037.08 ml  Output 3115 ml  Net -2077.92 ml   Filed Weights   07/14/18 0408 07/15/18 0432 07-21-2018 0332  Weight: 91.1 kg 91.3 kg 86.6 kg     Examination: General: Elderly female on vent.  HENT: [place]/AT, Pupils pinpoint.  PULM: Coarse bilateral breath sounds CV: RRR, no MRG GI: Normoactive bowel sounds.  MSK: Significant edema bilaterally upper and lower extremities.  Derm: Dependent third spaced edema Neuro: Patient's sedated on mechanical ventilation. RASS -4.   Resolved Hospital Problem list   HCAP  Assessment & Plan:   Acute hypoxemic respiratory failure requiring intubation mechanical ventilation, significant groundglass opacities on CT imaging consistent with ARDS, bilateral infiltrates, possible drug reaction as well as vascular congestion and increased volume with bilateral pleural effusion and consolidation.   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  Family at bedside prepared to initiate comfort care. Questions answered.   - Initiate morphine infusion - RT to extubate per protocol assuming comfort on PSV.  - Family has spoken with chaplain - Comfort cart at bedside.    Best practice:    Labs   CBC: Recent Labs  Lab 07/12/18 0257 07/12/18 1723 07/13/18 0506  WBC 9.2  --  13.3*  NEUTROABS 7.1  --  11.1*  HGB 6.9* 9.2* 9.4*  HCT 23.0* 29.6* 30.0*  MCV 100.0  --  99.7  PLT 66*  --  67*    Basic Metabolic Panel: Recent Labs  Lab 07/10/18 0332 07/11/18 0359 07/12/18 0257 07/13/18 0506  NA 144  --  148* 152*  K 4.8  --  4.8 5.1  CL 112*  --  116* 120*  CO2 24  --  23 23  GLUCOSE 153*  --  155* 144*  BUN 113*  --  141* 146*  CREATININE 3.91*  --  3.94* 3.51*  CALCIUM 7.5*  --  7.5* 7.7*  MG 3.0* 3.0*  --   --   PHOS 6.1* 6.7*  --   --    GFR: Estimated Creatinine Clearance: 15.4 mL/min (A) (by C-G formula based on SCr of 3.51 mg/dL (H)). Recent Labs   Lab 07/12/18 0257 07/13/18 0506  WBC 9.2 13.3*    Liver Function Tests: No results for input(s): AST, ALT, ALKPHOS, BILITOT, PROT, ALBUMIN in the last 168 hours. No results for input(s): LIPASE, AMYLASE in the last 168 hours. No results for input(s): AMMONIA in the last 168 hours.  ABG    Component Value Date/Time   PHART 7.334 (L) 07/08/2018 1502   PCO2ART 43.4 07/08/2018 1502   PO2ART 54.0 (L) 07/08/2018 1502   HCO3 23.3 07/08/2018 1502   TCO2 25 07/08/2018 1502   ACIDBASEDEF 3.0 (H) 07/08/2018 1502   O2SAT 86.0 07/08/2018 1502     Coagulation Profile: No results for input(s): INR, PROTIME in the last 168 hours.  Cardiac Enzymes: No results for input(s): CKTOTAL, CKMB, CKMBINDEX, TROPONINI in the last 168 hours.  HbA1C: Hgb A1c MFr Bld  Date/Time Value Ref Range Status  06/27/2018 06:41 AM 6.5 (H) 4.8 - 5.6 % Final    Comment:    (NOTE) Pre diabetes:          5.7%-6.4% Diabetes:              >6.4% Glycemic control for   <7.0% adults with diabetes     CBG: Recent Labs  Lab 07/15/18 1534 07/15/18 2003 07/15/18 2333 07-29-2018 0336 29-Jul-2018 [place]* 150* 120* 148*     Georgann Housekeeper, AGACNP-BC [place] Pager [phone] or [phone]  2018/07/29 11:20 AM

## 2018-07-19 NOTE — Progress Notes (Signed)
Patient terminally extubated per MD order. Family and RN at bedside. RT will continue to monitor.

## 2018-07-19 NOTE — Progress Notes (Signed)
Patient actively dying.Chaplain read some scriptures and prayed with family and patient.   LARONICA BHAGAT, Chaplain   07/27/18 1200  Clinical Encounter Type  Visited With Patient and family together  Visit Type Patient actively dying  Referral From Family  Consult/Referral To [place] text;Prayer;Emotional

## 2018-07-19 DEATH — deceased

## 2018-07-20 ENCOUNTER — Telehealth: Payer: Self-pay

## 2018-07-20 NOTE — Telephone Encounter (Signed)
Received dc from [address] [facility] (original). DC is for burial. Patient is a patient of Doctor Icard.   DC will be taken to [facility] for signature.

## 2018-07-21 NOTE — Telephone Encounter (Signed)
07/21/2018 I received signed D/C back from Dr. Valeta Harms. Mailed original to [facility]. In the envelop provided to [place] from [facility] home. PWR

## 2018-07-30 ENCOUNTER — Inpatient Hospital Stay: Payer: Medicare Other | Admitting: Neurology

## 2019-07-27 IMAGING — MR MR HEAD W/O CM
4 series · 48 of 48 positions shown · non-contrast
Comparison: Prior head CT from 06/26/2018 as well as recent MRI
from 06/22/2018.

CLINICAL DATA: Initial event do a shin for left hand numbness and
slurred speech, recent stroke.

EXAM:
MRI HEAD WITHOUT CONTRAST
TECHNIQUE: Multiplanar, multiecho pulse sequences of the brain and surrounding
structures were obtained without intravenous contrast.

[Series 5: DWI · axial · 3.0mm · 0.88mm/px · z∈[-21,+113]mm · 18 of 92 slices shown (1 of 4)]
[im 1/92]
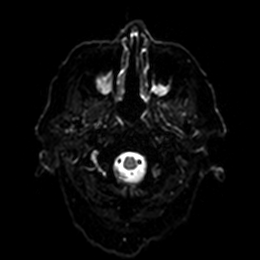
[im 6/92]
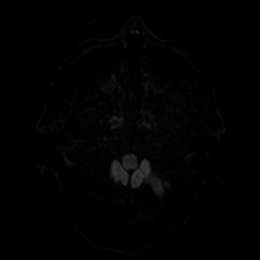
[im 11/92]
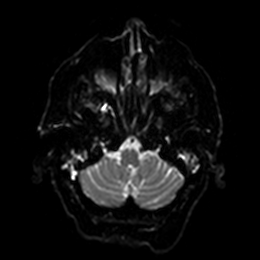
[im 17/92]
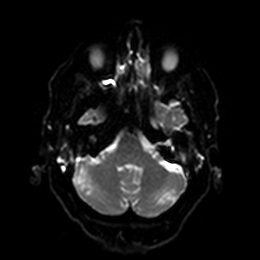
[im 22/92]
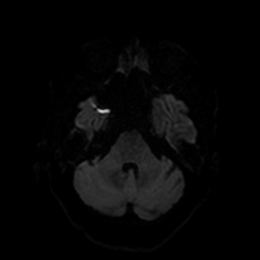
[im 27/92]
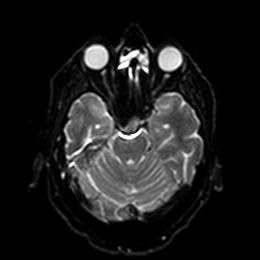
[im 33/92]
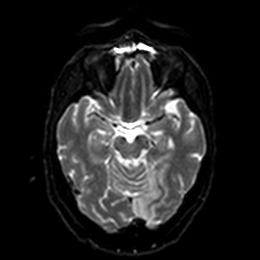
[im 38/92]
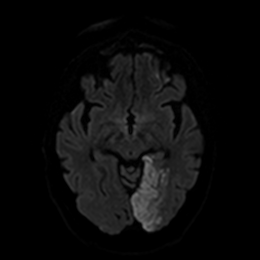
[im 43/92]
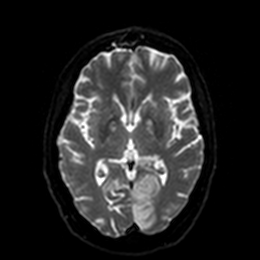
[im 49/92]
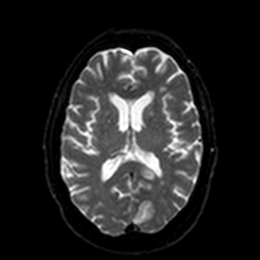
[im 54/92]
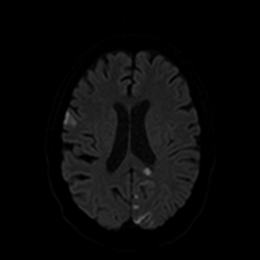
[im 59/92]
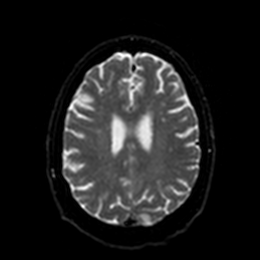
[im 65/92]
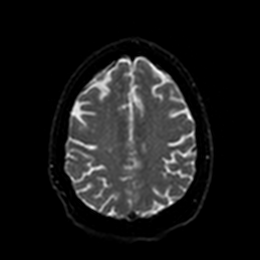
[im 70/92]
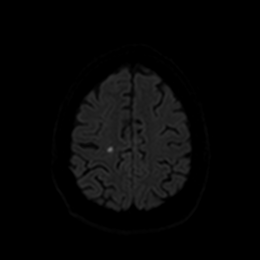
[im 75/92]
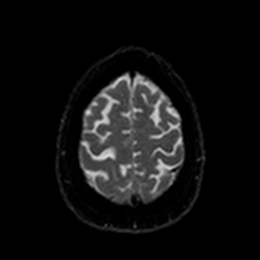
[im 81/92]
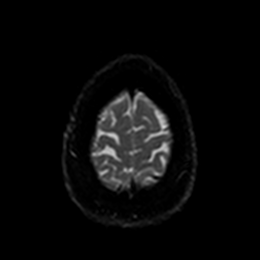
[im 86/92]
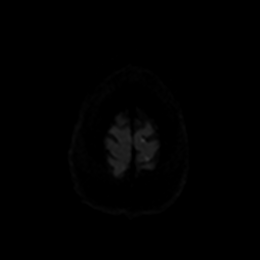
[im 92/92]
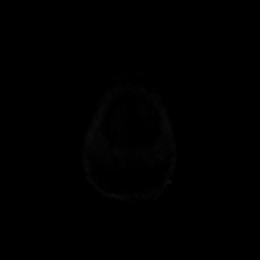

[Series 6: DWI · axial · 3.0mm · 0.88mm/px · z∈[-21,+113]mm · 9 of 46 slices shown (2 of 4)]
[im 1/46]
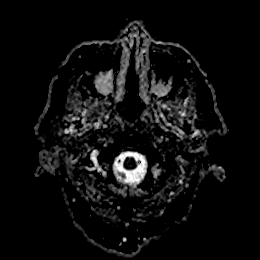
[im 6/46]
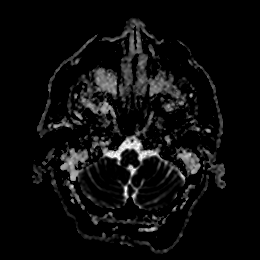
[im 12/46]
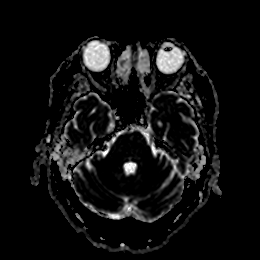
[im 17/46]
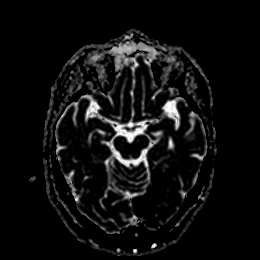
[im 23/46]
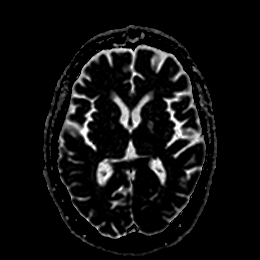
[im 29/46]
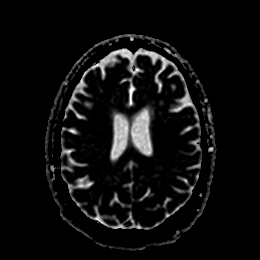
[im 34/46]
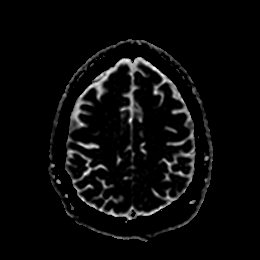
[im 40/46]
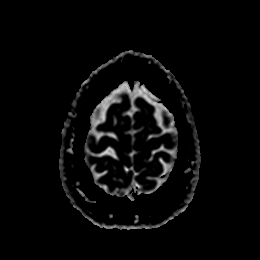
[im 46/46]
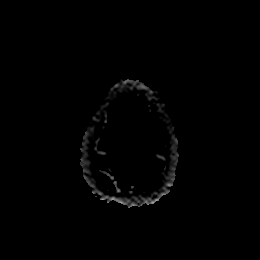

[Series 7: DWI · coronal · 4.0mm · 0.88mm/px · 14 of 72 slices shown (3 of 4)]
[im 1/72]
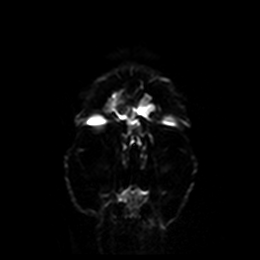
[im 6/72]
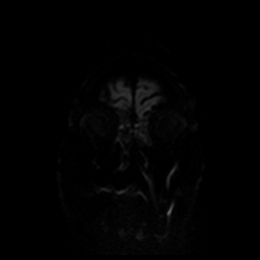
[im 11/72]
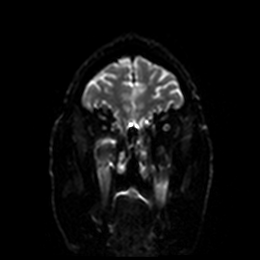
[im 17/72]
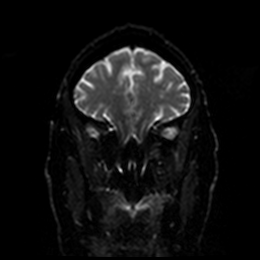
[im 22/72]
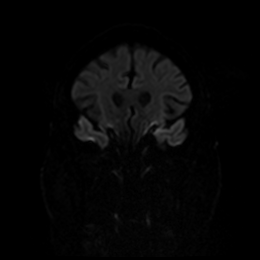
[im 28/72]
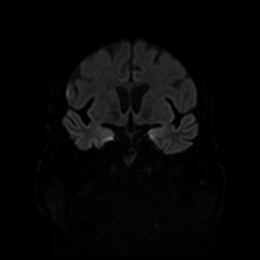
[im 33/72]
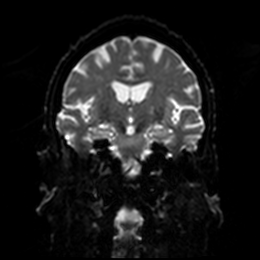
[im 39/72]
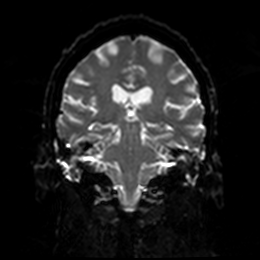
[im 44/72]
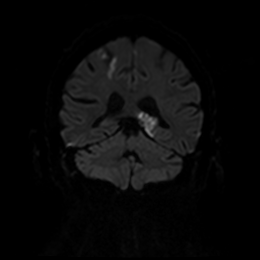
[im 50/72]
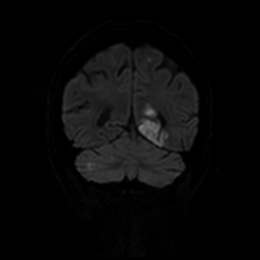
[im 55/72]
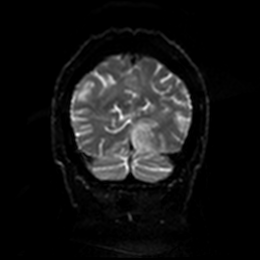
[im 61/72]
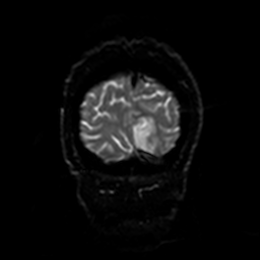
[im 66/72]
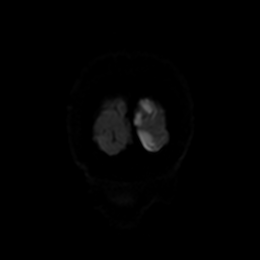
[im 72/72]
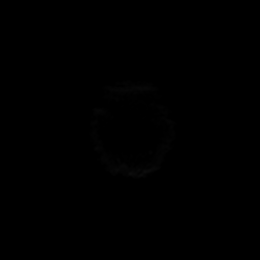

[Series 8: DWI · coronal · 4.0mm · 0.88mm/px · 7 of 36 slices shown (4 of 4)]
[im 1/36]
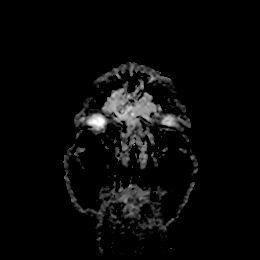
[im 6/36]
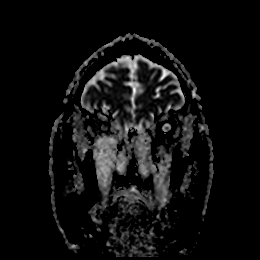
[im 12/36]
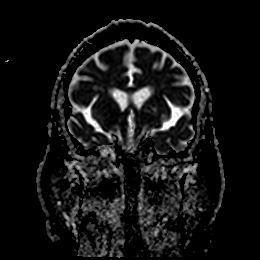
[im 18/36]
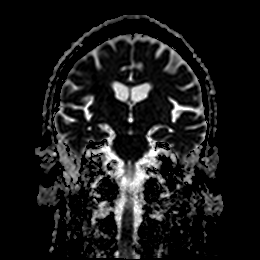
[im 24/36]
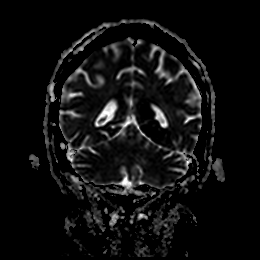
[im 30/36]
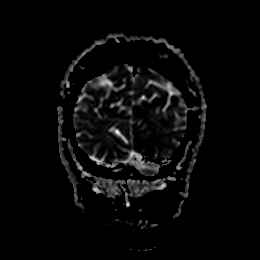
[im 36/36]
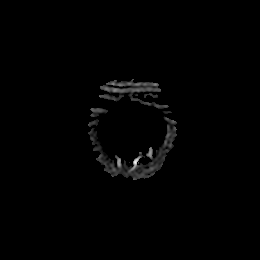

[48 of 48 positions shown; findings below may reference images not displayed]

FINDINGS: Examination is limited with only axial coronal DWI and ADC sequences
performed.

Normal expected interval evolution of recently identified infarcts
involving the right cerebellum, left parieto-occipital region, right
occipital region, and high right frontoparietal region, relatively
stable in size and distribution as compared to previous MRI from
06/22/2018. However, there is a new area of restricted diffusion
involving the cortical and subcortical right frontal lobe, not seen
on previous, and consistent with a new ischemic infarct (series 5,
image 76). Infarct fairly linear measuring up to approximately
cm in length. Infarct primarily involves the right frontal
operculum.

No other evidence for new or interval ischemia. No other definite
new acute intracranial abnormality seen on this limited exam.
IMPRESSION: 1. New acute ischemic infarct involving the cortical and subcortical
right frontal lobe at the level of the right frontal operculum, new
relative to recent MRI from 06/22/2018.
[DATE]. Otherwise, normal expected interval evolution of additional
scattered acute to subacute infarcts involving the bilateral
cerebral hemispheres and right cerebellum, relatively stable in size
and distribution from previous.

## 2019-08-02 IMAGING — DX DG CHEST 1V PORT
1 series · 1 of 1 positions shown · non-contrast
Comparison: Radiographs and CT 07/01/2018. Other chest radiographs
over the last month.

CLINICAL DATA: Endotracheal tube placement.

EXAM:
PORTABLE CHEST 1 VIEW

[chest ap]
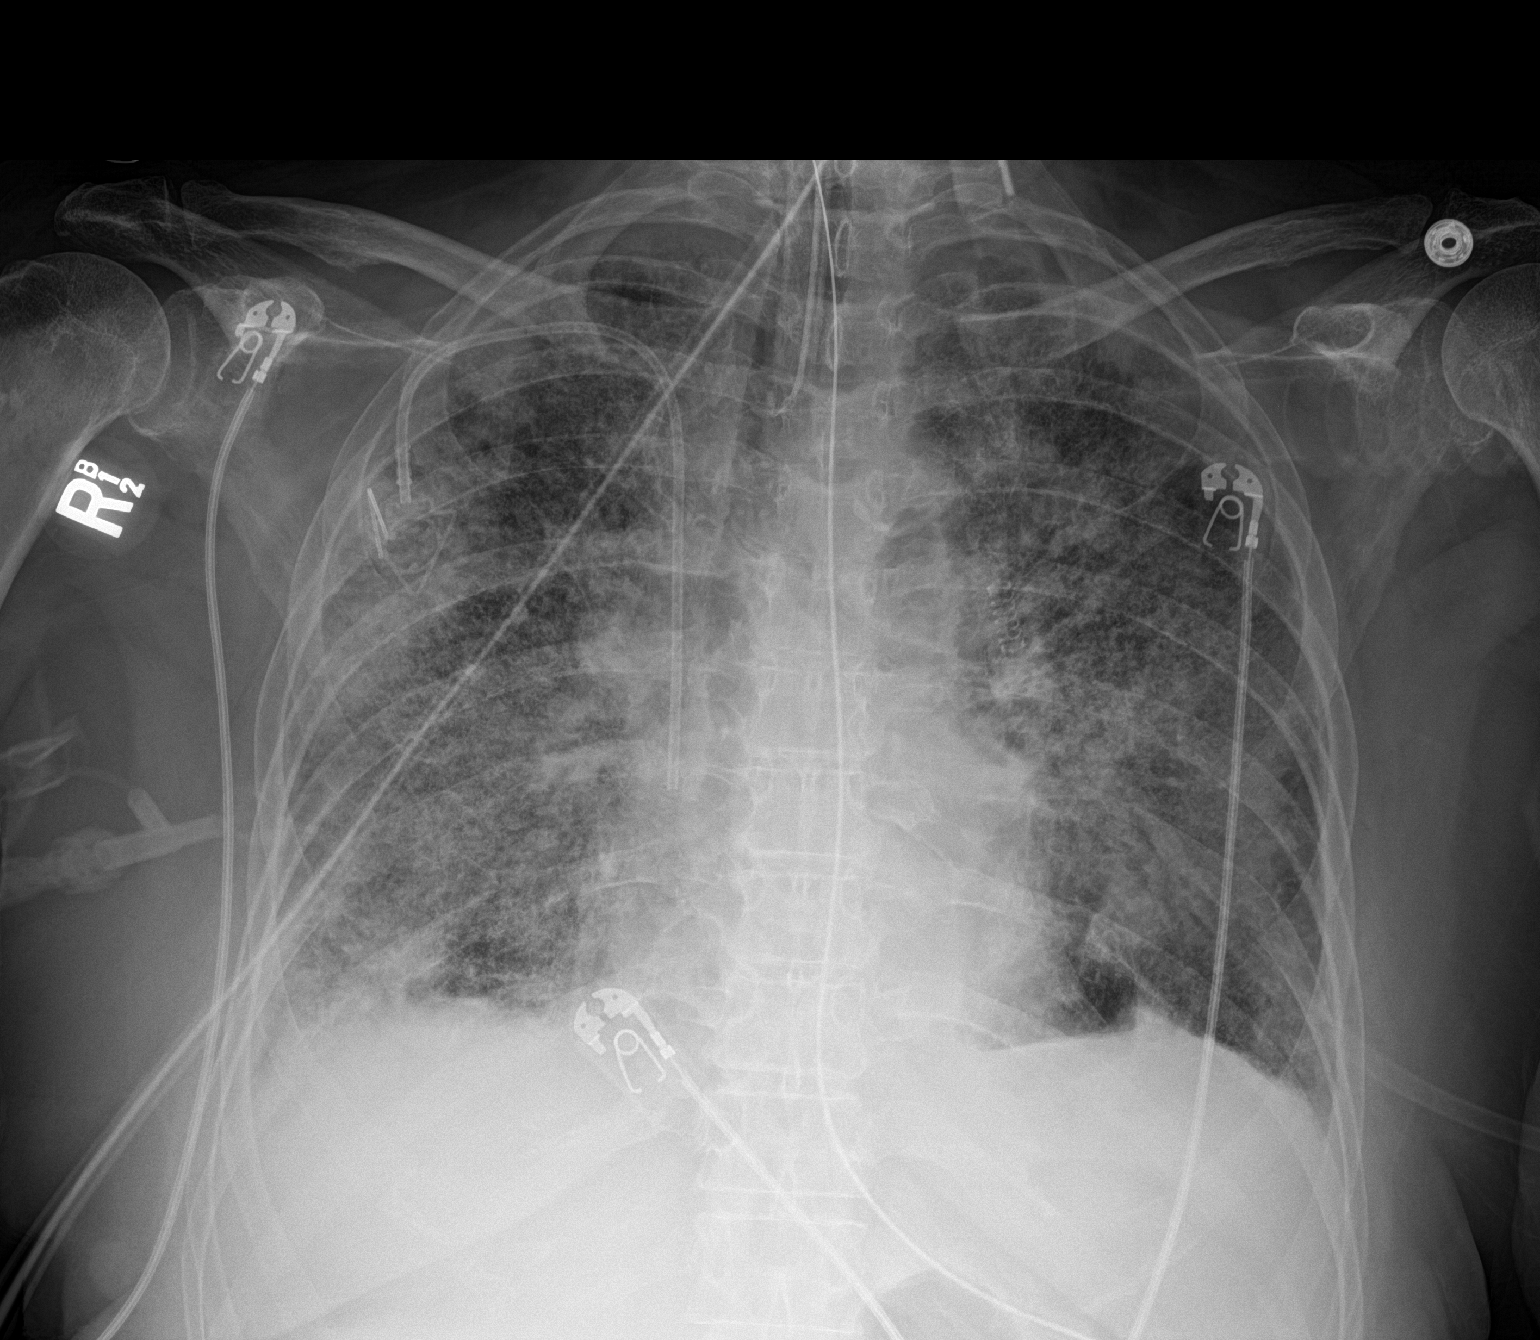

[1 of 1 positions shown; findings below may reference images not displayed]

FINDINGS: 9878 hours. Interval intubation. Tip of the endotracheal tube is in
the mid trachea. Enteric tube projects below the diaphragm, tip not
visualized. Right subclavian Port-A-Cath tip is at the superior
cavoatrial junction.

The heart size and mediastinal contours are stable. There has been
slight worsening in the diffuse bilateral airspace opacities. Small
bilateral pleural effusions appear unchanged. No pneumothorax.
IMPRESSION: 1. Satisfactory position of the endotracheal and enteric tubes.
2. Slight worsening in bilateral airspace opacities consistent with
pneumonia.
